# Patient Record
Sex: Male | Born: 1950 | Race: White | Hispanic: No | Marital: Married | Smoking: Former smoker
Health system: Southern US, Community
[De-identification: ages and names within clinical notes are randomized; demographics above are authoritative.]

## PROBLEM LIST (undated history)

## (undated) DIAGNOSIS — H269 Unspecified cataract: Secondary | ICD-10-CM

## (undated) DIAGNOSIS — K759 Inflammatory liver disease, unspecified: Secondary | ICD-10-CM

## (undated) DIAGNOSIS — I219 Acute myocardial infarction, unspecified: Secondary | ICD-10-CM

## (undated) DIAGNOSIS — Z87442 Personal history of urinary calculi: Secondary | ICD-10-CM

## (undated) DIAGNOSIS — E785 Hyperlipidemia, unspecified: Secondary | ICD-10-CM

## (undated) HISTORY — PX: EYE SURGERY: SHX253

## (undated) HISTORY — DX: Unspecified cataract: H26.9

## (undated) HISTORY — PX: COLONOSCOPY: SHX174

## (undated) HISTORY — DX: Hyperlipidemia, unspecified: E78.5

## (undated) HISTORY — DX: Inflammatory liver disease, unspecified: K75.9

## (undated) HISTORY — PX: POLYPECTOMY: SHX149

---
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
## 1973-08-30 HISTORY — PX: OTHER SURGICAL HISTORY: SHX169

## 2005-10-25 ENCOUNTER — Encounter: Payer: Self-pay | Admitting: Internal Medicine

## 2007-05-10 ENCOUNTER — Ambulatory Visit: Payer: Self-pay | Admitting: Internal Medicine

## 2007-05-11 LAB — CONVERTED CEMR LAB
Cholesterol: 217 mg/dL (ref 0–200)
Direct LDL: 150.7 mg/dL
PSA: 0.38 ng/mL (ref 0.10–4.00)
Triglycerides: 87 mg/dL (ref 0–149)

## 2007-05-16 ENCOUNTER — Encounter: Payer: Self-pay | Admitting: Internal Medicine

## 2007-06-06 ENCOUNTER — Encounter: Payer: Self-pay | Admitting: Internal Medicine

## 2007-06-16 ENCOUNTER — Encounter: Payer: Self-pay | Admitting: Internal Medicine

## 2007-12-12 ENCOUNTER — Ambulatory Visit: Payer: Self-pay | Admitting: Internal Medicine

## 2007-12-12 DIAGNOSIS — M6580 Other synovitis and tenosynovitis, unspecified site: Secondary | ICD-10-CM

## 2008-09-02 ENCOUNTER — Ambulatory Visit: Payer: Self-pay | Admitting: Internal Medicine

## 2008-09-02 DIAGNOSIS — N419 Inflammatory disease of prostate, unspecified: Secondary | ICD-10-CM | POA: Insufficient documentation

## 2008-09-02 LAB — CONVERTED CEMR LAB
Blood in Urine, dipstick: NEGATIVE
Glucose, Urine, Semiquant: NEGATIVE
Ketones, urine, test strip: NEGATIVE
Nitrite: NEGATIVE
Urobilinogen, UA: 0.2
pH: 5

## 2008-11-04 ENCOUNTER — Ambulatory Visit: Payer: Self-pay | Admitting: Internal Medicine

## 2008-11-04 DIAGNOSIS — D239 Other benign neoplasm of skin, unspecified: Secondary | ICD-10-CM | POA: Insufficient documentation

## 2008-11-07 LAB — CONVERTED CEMR LAB
Glucose, Bld: 73 mg/dL (ref 70–99)
PSA: 0.32 ng/mL (ref 0.10–4.00)

## 2008-11-21 ENCOUNTER — Encounter: Payer: Self-pay | Admitting: Internal Medicine

## 2008-11-27 ENCOUNTER — Encounter: Payer: Self-pay | Admitting: Internal Medicine

## 2009-12-26 ENCOUNTER — Ambulatory Visit: Payer: Self-pay | Admitting: Internal Medicine

## 2010-09-30 NOTE — Assessment & Plan Note (Signed)
Summary: cpx/mk   Vital Signs:  Patient profile:   60 year old male Weight:      218 pounds BMI:     26.81 Temp:     97.6 degrees F oral Pulse rate:   60 / minute Pulse rhythm:   regular BP sitting:   110 / 70  (left arm) Cuff size:   large  Vitals Entered By: Mervin Hack CMA Duncan Dull) (December 26, 2009 8:19 AM) CC: adult physical   History of Present Illness: Doing well  Notes some heel pain---upon first getting up in the AM discussed plantar fasciitis reviewed stability shoes and arch support (he has well preserved arches)  Awakens with numbness in both arms at times goes away very quickly discussed pillow and positioning --not worrisome  Still works with [employer] regularly  Allergies: No Known Drug Allergies  Past History:  Past medical, surgical, family and social histories (including risk factors) reviewed for relevance to current acute and chronic problems.  Past Medical History: Reviewed history from 05/10/2007 and no changes required. Unremarkable  Past Surgical History: Reviewed history from 05/10/2007 and no changes required. 1959 Bilateral inguinal hernia 1961 Tonsils and adenoids 1975 Arm injury  Family History: Reviewed history from 05/10/2007 and no changes required. Dad died when he was a baby of intestinal blockage Mom with cancer in lung (surgically resected) 2 --- 1/2brothers and 2----1/2 sisters No CAD, HTN, DM No colon or prostate Hearing loss on Mom's side  Social History: Reviewed history from 05/10/2007 and no changes required. Retired--Air traffic controller Married--2 sons Former Smoker--quit 30 years ago Alcohol use-occ  Review of Systems General:  Denies sleep disorder; weight stable wears seat belt. Eyes:  Denies double vision and vision loss-1 eye. ENT:  Denies decreased hearing and ringing in ears; teeth okay--regular with dentist. CV:  Denies chest pain or discomfort, difficulty breathing at night, difficulty breathing  while lying down, fainting, lightheadness, palpitations, and shortness of breath with exertion. Resp:  Denies cough and shortness of breath. GI:  Denies abdominal pain, bloody stools, change in bowel habits, dark tarry stools, indigestion, nausea, and vomiting. GU:  Complains of nocturia; denies erectile dysfunction, urinary frequency, and urinary hesitancy; nocturia x 1. MS:  Denies joint pain and joint swelling. Derm:  Denies lesion(s) and rash. Neuro:  See HPI; Complains of numbness; denies headaches and weakness. Psych:  Denies anxiety and depression. Heme:  Denies abnormal bruising and enlarge lymph nodes. Allergy:  Denies seasonal allergies and sneezing.  Physical Exam  General:  alert and normal appearance.   Eyes:  pupils equal, pupils round, pupils reactive to light, and no optic disk abnormalities.   Ears:  R ear normal and L ear normal.   Mouth:  no erythema, no exudates, and no lesions.   Neck:  supple, no masses, no thyromegaly, no carotid bruits, and no cervical lymphadenopathy.   Lungs:  normal respiratory effort and normal breath sounds.   Heart:  normal rate, regular rhythm, no murmur, and no gallop.   Abdomen:  soft.   Rectal:  no hemorrhoids and no masses.   Prostate:  no gland enlargement and no nodules.   Msk:  no joint tenderness and no joint swelling.   Preserved arches without tenderness Pulses:  normal in feet Extremities:  no edema Neurologic:  alert & oriented X3, strength normal in all extremities, and gait normal.   Skin:  no rashes and no suspicious lesions.   Axillary Nodes:  No palpable lymphadenopathy Psych:  normally interactive,  good eye contact, not anxious appearing, and not depressed appearing.     Impression & Recommendations:  Problem # 1:  PREVENTIVE HEALTH CARE (ICD-V70.0) Assessment Comment Only  healthy discussed fitness which he is doing PSA will check sugar  Orders: TLB-Glucose, QUANT (82947-GLU)  Other Orders: Venipuncture  (16109) TLB-PSA (Prostate Specific Antigen) (84153-PSA)  Patient Instructions: 1)  Please schedule a follow-up appointment in 1 year.   Prior Medications: None Current Allergies (reviewed today): No known allergies

## 2011-01-05 ENCOUNTER — Encounter: Payer: Self-pay | Admitting: Internal Medicine

## 2011-01-07 ENCOUNTER — Encounter: Payer: Self-pay | Admitting: Family Medicine

## 2011-01-07 ENCOUNTER — Ambulatory Visit (INDEPENDENT_AMBULATORY_CARE_PROVIDER_SITE_OTHER): Payer: Federal, State, Local not specified - PPO | Admitting: Family Medicine

## 2011-01-07 VITALS — BP 130/80 | HR 64 | Temp 98.2°F | Wt 215.0 lb

## 2011-01-07 DIAGNOSIS — R05 Cough: Secondary | ICD-10-CM

## 2011-01-07 MED ORDER — AZITHROMYCIN 250 MG PO TABS: ORAL_TABLET | ORAL | Status: DC

## 2011-01-07 NOTE — Progress Notes (Signed)
4 weeks ago with chest congestion and dry cough. "I thought it was allergies."  No help with allergy meds.  1 week ago he thought he was getting better, the cough was becoming productive.  "Over the weekend, I felt good.  More cough last night, better this AM."  Sluggish with less energy.  Some nausea prev.  His ears feel plugged up.  Normal appetite.  No GI/GU sx.  No fevers, no chills.    Wife starting with chemotherapy for endometrial cancer.  Meds, vitals, and allergies reviewed.   ROS: See HPI.  Otherwise, noncontributory.  GEN: nad, alert and oriented HEENT: mucous membranes moist, no erythema in OP, tm wnl with normal movement on valsalva NECK: supple w/o LA CV: rrr.  no murmur PULM: no inc wob, cta on R side but scattered ronchi on L sided, no focal dec in bs ABD: soft, +bs EXT: no edema SKIN: no acute rash

## 2011-01-07 NOTE — Assessment & Plan Note (Addendum)
?  walking pneumonia.  Give duration and exam, I would treat with zmax and have pt fu prn.  Nontoxic.  D/w pt and he understood.  CXR would likely not change management, so this was deferred.  This can be considered in the future if sx aren't resolving.   He agrees.

## 2011-01-07 NOTE — Patient Instructions (Signed)
I would start the antibiotics and let us know if the cough doesn't gradually resolve.  Take care.

## 2011-01-26 ENCOUNTER — Ambulatory Visit (INDEPENDENT_AMBULATORY_CARE_PROVIDER_SITE_OTHER): Payer: Federal, State, Local not specified - PPO | Admitting: Internal Medicine

## 2011-01-26 ENCOUNTER — Encounter: Payer: Self-pay | Admitting: Internal Medicine

## 2011-01-26 VITALS — BP 110/68 | HR 62 | Temp 98.6°F | Ht 76.0 in | Wt 215.0 lb

## 2011-01-26 DIAGNOSIS — Z0000 Encounter for general adult medical examination without abnormal findings: Secondary | ICD-10-CM | POA: Insufficient documentation

## 2011-01-26 NOTE — Progress Notes (Signed)
Subjective:    Patient ID: Philip Mack, male    DOB: Sep 11, 1950, 60 y.o.   MRN: [id number]  HPI Feels mostly better No cough Still some lightheadedness and feeling sluggish--mostly every other day Back to exercising though  No new concerns Colon due in 5 years  Current outpatient prescriptions:DISCONTD: azithromycin (ZITHROMAX) 250 MG tablet, Take 2 tablets by mouth on day 1, followed by 1 tablet by mouth daily for 4 days.  , Disp: , Rfl:   No past medical history on file.  Past Surgical History  Procedure Date  . Hernia repair 1959    Bilateral, inguinal  . Tonsillectomy and adenoidectomy 1961  . Arm injury 1975    Family History  Problem Relation Age of Onset  . Cancer Mother     Lung, (surgically resected)  . Heart disease Neg Hx     CAD  . Hypertension Neg Hx   . Diabetes Neg Hx   . Hearing loss Other     On Mom's side    History   Social History  . Marital Status: Married    Spouse Name: N/A    Number of Children: 2  . Years of Education: N/A   Occupational History  . Retired Forensic psychologist    Social History Main Topics  . Smoking status: Former Smoker    Types: Cigarettes    Quit date: 08/30/1980  . Smokeless tobacco: Not on file  . Alcohol Use: Yes     Occasionally  . Drug Use: Not on file  . Sexually Active: Not on file   Other Topics Concern  . Not on file   Social History Narrative  . No narrative on file   Review of Systems  Constitutional: Negative for activity change and unexpected weight change.       Wears seat belt  HENT: Negative for hearing loss, congestion, rhinorrhea, dental problem and tinnitus.   Eyes: Negative for visual disturbance.       No diplopia or focal vision loss  Respiratory: Negative for cough, chest tightness and shortness of breath.   Cardiovascular: Negative for chest pain, palpitations and leg swelling.  Gastrointestinal: Negative for nausea, vomiting, constipation and blood in stool.       No  heartburn  Genitourinary: Negative for dysuria, urgency and difficulty urinating.       No sexual problems  Musculoskeletal: Negative for back pain, joint swelling and arthralgias.  Skin: Negative for rash.       No suspicious lesions  Neurological: Negative for dizziness, syncope, weakness, light-headedness, numbness and headaches.  Hematological: Negative for adenopathy. Does not bruise/bleed easily.  Psychiatric/Behavioral: Negative for sleep disturbance and dysphoric mood. The patient is not nervous/anxious.        Stress---wife with endometrial cancer undergoing Rx       Objective:   Physical Exam  Constitutional: He is oriented to person, place, and time. He appears well-developed and well-nourished. No distress.  HENT:  Head: Normocephalic.  Right Ear: External ear normal.  Mouth/Throat: Oropharynx is clear and moist. No oropharyngeal exudate.       TMs benign  Eyes: Conjunctivae and EOM are normal. Pupils are equal, round, and reactive to light.       Fundi benign  Neck: Normal range of motion. Neck supple. No thyromegaly present.  Cardiovascular: Normal rate, regular rhythm and intact distal pulses.  Exam reveals no gallop.   No murmur heard. Pulmonary/Chest: Effort normal and breath sounds normal. No respiratory distress. He has  no wheezes. He has no rales.  Abdominal: He exhibits no mass. There is no tenderness.  Musculoskeletal: Normal range of motion. He exhibits no edema and no tenderness.  Lymphadenopathy:    He has no cervical adenopathy.  Neurological: He is alert and oriented to person, place, and time. He exhibits normal muscle tone.       Normal strength and gait  Skin: Skin is warm. No rash noted.  Psychiatric: He has a normal mood and affect. His behavior is normal. Judgment and thought content normal.          Assessment & Plan:

## 2011-06-11 ENCOUNTER — Encounter: Payer: Self-pay | Admitting: Family Medicine

## 2011-06-11 ENCOUNTER — Ambulatory Visit (INDEPENDENT_AMBULATORY_CARE_PROVIDER_SITE_OTHER): Payer: Federal, State, Local not specified - PPO | Admitting: Family Medicine

## 2011-06-11 DIAGNOSIS — J3489 Other specified disorders of nose and nasal sinuses: Secondary | ICD-10-CM

## 2011-06-11 MED ORDER — MUPIROCIN CALCIUM 2 % EX CREA: TOPICAL_CREAM | CUTANEOUS | Status: DC

## 2011-06-11 NOTE — Progress Notes (Signed)
  Subjective:    Patient ID: Philip Mack, male    DOB: 09/19/1950, 60 y.o.   MRN: [id number]  HPI CC: sore inside nostril  Several month (>8wks) h/o sore along upper rim of left nostril.  Does bleed occasionally.  Initially with spot on right nostril as well, but that has since cleared.  Tried ice, hot packs, peroxide.  Swelling goes down but never fully heals.  No other rash.  No oral lesions.  No joint or muscle pains.  No fevers/chills.  Review of Systems Per HPI    Objective:   Physical Exam  Nursing note and vitals reviewed. Constitutional: He appears well-developed and well-nourished. No distress.  HENT:  Head: Normocephalic and atraumatic.  Right Ear: Hearing, tympanic membrane, external ear and ear canal normal.  Left Ear: Hearing, tympanic membrane, external ear and ear canal normal.  Nose: No mucosal edema, rhinorrhea, septal deviation or nasal septal hematoma. No epistaxis.  No foreign bodies. Right sinus exhibits no maxillary sinus tenderness and no frontal sinus tenderness. Left sinus exhibits no maxillary sinus tenderness and no frontal sinus tenderness.  Mouth/Throat: Uvula is midline, oropharynx is clear and moist and mucous membranes are normal. No oropharyngeal exudate, posterior oropharyngeal edema, posterior oropharyngeal erythema or tonsillar abscesses.       Sub-mm lesion left superior interior nasal ridge - dry crusted shallow ulcer. No mucosal lesions identified.  Eyes: Conjunctivae and EOM are normal. Pupils are equal, round, and reactive to light. No scleral icterus.  Neck: Normal range of motion. Neck supple.  Cardiovascular: Normal rate, regular rhythm, normal heart sounds and intact distal pulses.   No murmur heard. Pulmonary/Chest: Effort normal and breath sounds normal. No respiratory distress. He has no wheezes. He has no rales.  Lymphadenopathy:    He has no cervical adenopathy.  Skin: Skin is warm and dry. No rash noted.  Psychiatric: He has a  normal mood and affect.          Assessment & Plan:

## 2011-06-11 NOTE — Patient Instructions (Signed)
Let's use mupirocin 3 times a day for next 1-2 wks. If not healing, let me know and I will set you up with dermatology for further evaluation. Stop peroxide. May continue warm compresses. Good to see you today.

## 2011-06-11 NOTE — Assessment & Plan Note (Signed)
Poorly healing shallow ulcer on exterior ridge of tip of left ala. Treat with mupirocin (cover MRSA) for next 1-2 wks. If not improved, advised to call us for referral to derm for further eval.

## 2012-03-30 ENCOUNTER — Ambulatory Visit (INDEPENDENT_AMBULATORY_CARE_PROVIDER_SITE_OTHER): Payer: Federal, State, Local not specified - PPO | Admitting: Internal Medicine

## 2012-03-30 ENCOUNTER — Encounter: Payer: Self-pay | Admitting: Internal Medicine

## 2012-03-30 VITALS — BP 131/71 | HR 61 | Temp 98.3°F | Ht 76.0 in | Wt 220.5 lb

## 2012-03-30 DIAGNOSIS — E785 Hyperlipidemia, unspecified: Secondary | ICD-10-CM

## 2012-03-30 DIAGNOSIS — Z0000 Encounter for general adult medical examination without abnormal findings: Secondary | ICD-10-CM

## 2012-03-30 NOTE — Assessment & Plan Note (Signed)
HDL is good Will just recheck but no plans for meds

## 2012-03-30 NOTE — Patient Instructions (Signed)
Please try loratadine 10mg  1-2 daily or cetirizine 10mg  daily for spring allergies

## 2012-03-30 NOTE — Progress Notes (Signed)
Subjective:    Patient ID: Philip Mack, male    DOB: Mar 01, 1951, 61 y.o.   MRN: [id number]  HPI Doing well  Has noted some rash and itching along extensor right arm Mild stress only---issues at church ?exposure in yard Discussed neurodermatitis  Has had some allergy issues in the past 2 springs Phlegm in throat and congestion Discussed allergy meds OTC  Current Outpatient Prescriptions on File Prior to Visit  Medication Sig Dispense Refill  . mupirocin (BACTROBAN) 2 % cream Apply to affected area 3 times daily  30 g  0    No Known Allergies  Past Medical History  Diagnosis Date  . Hyperlipidemia     Past Surgical History  Procedure Date  . Hernia repair 1959    Bilateral, inguinal  . Tonsillectomy and adenoidectomy 1961  . Arm injury 1975    Family History  Problem Relation Age of Onset  . Cancer Mother     Lung, (surgically resected)  . Heart disease Neg Hx     CAD  . Hypertension Neg Hx   . Diabetes Neg Hx   . Hearing loss Other     On Mom's side    History   Social History  . Marital Status: Married    Spouse Name: N/A    Number of Children: 2  . Years of Education: N/A   Occupational History  . Retired Forensic psychologist    Social History Main Topics  . Smoking status: Former Smoker    Types: Cigarettes    Quit date: 08/30/1980  . Smokeless tobacco: Not on file  . Alcohol Use: Yes     Occasionally  . Drug Use: No  . Sexually Active: Not on file   Other Topics Concern  . Not on file   Social History Narrative  . No narrative on file   Review of Systems  Constitutional: Negative for fatigue and unexpected weight change.       Wears seat belt [facility] [employer]  HENT: Positive for congestion and rhinorrhea. Negative for hearing loss, dental problem and tinnitus.        Regular with dentist---has needed periodontal work also  Eyes: Negative for visual disturbance.       No diplopia or unilateral vision loss    Respiratory: Negative for cough, chest tightness and shortness of breath.   Cardiovascular: Negative for chest pain, palpitations and leg swelling.  Gastrointestinal: Negative for nausea, vomiting, abdominal pain, constipation and blood in stool.       No heartburn  Genitourinary: Negative for urgency, frequency and difficulty urinating.       Nocturia x 1. Rarely 2 No sexual problems  Musculoskeletal: Negative for back pain, joint swelling and arthralgias.  Skin: Positive for rash.       No suspicious lesions  Neurological: Negative for dizziness, syncope, weakness, light-headedness, numbness and headaches.       Extremities "fall asleep" easier at night  Hematological: Negative for adenopathy. Does not bruise/bleed easily.  Psychiatric/Behavioral: Negative for disturbed wake/sleep cycle and dysphoric mood. The patient is not nervous/anxious.        Objective:   Physical Exam  Constitutional: He is oriented to person, place, and time. He appears well-developed and well-nourished. No distress.  HENT:  Head: Normocephalic and atraumatic.  Right Ear: External ear normal.  Left Ear: External ear normal.  Mouth/Throat: Oropharynx is clear and moist. No oropharyngeal exudate.  Eyes: Conjunctivae and EOM are normal. Pupils are equal, round,  and reactive to light.  Neck: Normal range of motion. Neck supple. No thyromegaly present.  Cardiovascular: Normal rate, regular rhythm, normal heart sounds and intact distal pulses.  Exam reveals no gallop.   No murmur heard. Pulmonary/Chest: Effort normal and breath sounds normal. No respiratory distress. He has no wheezes. He has no rales.  Abdominal: Soft. There is no tenderness.  Musculoskeletal: Normal range of motion. He exhibits no edema and no tenderness.  Lymphadenopathy:    He has no cervical adenopathy.  Neurological: He is alert and oriented to person, place, and time.  Skin: Skin is warm. No rash noted.  Psychiatric: He has a normal  mood and affect. His behavior is normal. Judgment and thought content normal.          Assessment & Plan:

## 2012-03-30 NOTE — Assessment & Plan Note (Signed)
Healthy Not due for colonoscopy till 2017 Will defer PSA at least this year Really does well with fitness

## 2012-03-31 ENCOUNTER — Telehealth: Payer: Self-pay | Admitting: Radiology

## 2012-03-31 LAB — LIPID PANEL
Cholesterol: 221 mg/dL — ABNORMAL HIGH (ref 0–200)
Total CHOL/HDL Ratio: 4
Triglycerides: 124 mg/dL (ref 0.0–149.0)
VLDL: 24.8 mg/dL (ref 0.0–40.0)

## 2012-03-31 LAB — HEPATIC FUNCTION PANEL
ALT: 24 U/L (ref 0–53)
Alkaline Phosphatase: 44 U/L (ref 39–117)
Bilirubin, Direct: 0.1 mg/dL (ref 0.0–0.3)
Total Bilirubin: 0.5 mg/dL (ref 0.3–1.2)

## 2012-03-31 LAB — CBC WITH DIFFERENTIAL/PLATELET
Basophils Relative: 0.2 % (ref 0.0–3.0)
Eosinophils Absolute: 0.1 10*3/uL (ref 0.0–0.7)
Eosinophils Relative: 1.2 % (ref 0.0–5.0)
Lymphocytes Relative: 31.4 % (ref 12.0–46.0)
MCHC: 33.5 g/dL (ref 30.0–36.0)
Monocytes Relative: 12.6 % — ABNORMAL HIGH (ref 3.0–12.0)
Neutrophils Relative %: 54.6 % (ref 43.0–77.0)
RBC: 4.65 Mil/uL (ref 4.22–5.81)
WBC: 5.9 10*3/uL (ref 4.5–10.5)

## 2012-03-31 LAB — BASIC METABOLIC PANEL
Calcium: 9.7 mg/dL (ref 8.4–10.5)
Creatinine, Ser: 0.9 mg/dL (ref 0.4–1.5)
GFR: 88.97 mL/min (ref >60.00)

## 2012-03-31 NOTE — Telephone Encounter (Signed)
[facility] called a critical Glucose - 48, results given to Dr Alphonsus Sias

## 2012-03-31 NOTE — Telephone Encounter (Signed)
Not on oral hypoglycemics--not a concern

## 2012-04-04 ENCOUNTER — Encounter: Payer: Self-pay | Admitting: *Deleted

## 2012-06-14 ENCOUNTER — Emergency Department: Payer: Self-pay | Admitting: Emergency Medicine

## 2012-06-23 ENCOUNTER — Emergency Department: Payer: Self-pay | Admitting: Internal Medicine

## 2013-05-25 ENCOUNTER — Ambulatory Visit (INDEPENDENT_AMBULATORY_CARE_PROVIDER_SITE_OTHER): Payer: Federal, State, Local not specified - PPO | Admitting: Internal Medicine

## 2013-05-25 ENCOUNTER — Encounter: Payer: Self-pay | Admitting: Internal Medicine

## 2013-05-25 VITALS — BP 120/70 | HR 68 | Temp 97.7°F | Ht 76.0 in | Wt 221.0 lb

## 2013-05-25 DIAGNOSIS — Z0000 Encounter for general adult medical examination without abnormal findings: Secondary | ICD-10-CM

## 2013-05-25 DIAGNOSIS — Z125 Encounter for screening for malignant neoplasm of prostate: Secondary | ICD-10-CM

## 2013-05-25 MED ORDER — ZOSTER VACCINE LIVE 19400 UNT/0.65ML ~~LOC~~ SOLR: 0.6500 mL | Freq: Once | SUBCUTANEOUS | Status: DC

## 2013-05-25 NOTE — Assessment & Plan Note (Signed)
Healthy Stays fit Counseling done Rx for zostavax Will check PSA Defer other labs this year

## 2013-05-25 NOTE — Progress Notes (Signed)
Subjective:    Patient ID: Philip Mack, male    DOB: 11-May-1951, 62 y.o.   MRN: [id number]  HPI Here for physical Retired but volunteers at [employer] for [employer] Still works out regularly  Has a scab on the top of his head for weeks Won't heal Not itchy  No current outpatient prescriptions on file prior to visit.   No current facility-administered medications on file prior to visit.    No Known Allergies  Past Medical History  Diagnosis Date  . Hyperlipidemia     Past Surgical History  Procedure Laterality Date  . Hernia repair  1959    Bilateral, inguinal  . Tonsillectomy and adenoidectomy  1961  . Arm injury  1975    Family History  Problem Relation Age of Onset  . Cancer Mother     Lung, (surgically resected)  . Heart disease Neg Hx     CAD  . Hypertension Neg Hx   . Diabetes Neg Hx   . Hearing loss Other     On Mom's side    History   Social History  . Marital Status: Married    Spouse Name: N/A    Number of Children: 2  . Years of Education: N/A   Occupational History  . Retired Forensic psychologist    Social History Main Topics  . Smoking status: Former Smoker    Types: Cigarettes    Quit date: 08/30/1980  . Smokeless tobacco: Never Used  . Alcohol Use: Yes     Comment: Occasionally  . Drug Use: No  . Sexual Activity: Not on file   Other Topics Concern  . Not on file   Social History Narrative  . No narrative on file   Review of Systems  Constitutional: Negative for fatigue and unexpected weight change.       Wears seat belt  HENT: Negative for hearing loss, congestion, rhinorrhea, dental problem and tinnitus.        Regular with dentist and periodentist  Eyes: Negative for visual disturbance.       No diplopia or unilateral vision loss  Respiratory: Negative for cough, chest tightness and shortness of breath.   Cardiovascular: Negative for chest pain, palpitations and leg swelling.  Gastrointestinal: Negative for nausea,  vomiting, abdominal pain, constipation and blood in stool.       No heartburn  Endocrine: Negative for cold intolerance and heat intolerance.  Genitourinary: Positive for difficulty urinating. Negative for urgency and frequency.       Rare dribbling No sexual problems  Musculoskeletal: Negative for back pain, joint swelling and arthralgias.  Skin: Negative for rash.       Spot on head  Allergic/Immunologic: Negative for environmental allergies and immunocompromised state.  Neurological: Negative for dizziness, syncope, weakness, light-headedness, numbness and headaches.  Hematological: Negative for adenopathy. Does not bruise/bleed easily.  Psychiatric/Behavioral: Negative for sleep disturbance and dysphoric mood. The patient is not nervous/anxious.        Objective:   Physical Exam  Constitutional: He is oriented to person, place, and time. He appears well-developed and well-nourished. No distress.  HENT:  Head: Normocephalic and atraumatic.  Right Ear: External ear normal.  Left Ear: External ear normal.  Mouth/Throat: Oropharynx is clear and moist. No oropharyngeal exudate.  Eyes: Conjunctivae and EOM are normal. Pupils are equal, round, and reactive to light.  Neck: Normal range of motion. Neck supple. No thyromegaly present.  Cardiovascular: Normal rate, regular rhythm, normal heart sounds and intact distal pulses.  Exam reveals no gallop.   No murmur heard. Pulmonary/Chest: Effort normal and breath sounds normal. No respiratory distress. He has no wheezes. He has no rales.  Abdominal: Soft. There is no tenderness.  Musculoskeletal: He exhibits no edema and no tenderness.  Lymphadenopathy:    He has no cervical adenopathy.  Neurological: He is alert and oriented to person, place, and time.  Skin: No rash noted. No erythema.  Psychiatric: He has a normal mood and affect. His behavior is normal.          Assessment & Plan:

## 2014-07-08 ENCOUNTER — Ambulatory Visit (INDEPENDENT_AMBULATORY_CARE_PROVIDER_SITE_OTHER): Payer: Federal, State, Local not specified - PPO | Admitting: Internal Medicine

## 2014-07-08 ENCOUNTER — Encounter: Payer: Self-pay | Admitting: Internal Medicine

## 2014-07-08 VITALS — BP 118/78 | HR 65 | Temp 98.4°F | Ht 76.0 in | Wt 219.0 lb

## 2014-07-08 DIAGNOSIS — Z0000 Encounter for general adult medical examination without abnormal findings: Secondary | ICD-10-CM

## 2014-07-08 DIAGNOSIS — Z23 Encounter for immunization: Secondary | ICD-10-CM

## 2014-07-08 DIAGNOSIS — E785 Hyperlipidemia, unspecified: Secondary | ICD-10-CM

## 2014-07-08 DIAGNOSIS — S46911A Strain of unspecified muscle, fascia and tendon at shoulder and upper arm level, right arm, initial encounter: Secondary | ICD-10-CM

## 2014-07-08 DIAGNOSIS — M25511 Pain in right shoulder: Secondary | ICD-10-CM | POA: Insufficient documentation

## 2014-07-08 LAB — COMPREHENSIVE METABOLIC PANEL
ALK PHOS: 46 U/L (ref 39–117)
ALT: 23 U/L (ref 0–53)
AST: 30 U/L (ref 0–37)
Albumin: 3.8 g/dL (ref 3.5–5.2)
BILIRUBIN TOTAL: 0.8 mg/dL (ref 0.2–1.2)
BUN: 14 mg/dL (ref 6–23)
CO2: 27 mEq/L (ref 19–32)
Calcium: 9.6 mg/dL (ref 8.4–10.5)
Chloride: 104 mEq/L (ref 96–112)
Creatinine, Ser: 0.9 mg/dL (ref 0.4–1.5)
GFR: 91.75 mL/min (ref >60.00)
Glucose, Bld: 91 mg/dL (ref 70–99)
Potassium: 4.1 mEq/L (ref 3.5–5.1)
SODIUM: 138 meq/L (ref 135–145)
TOTAL PROTEIN: 7.2 g/dL (ref 6.0–8.3)

## 2014-07-08 LAB — T4, FREE: FREE T4: 0.97 ng/dL (ref 0.60–1.60)

## 2014-07-08 LAB — CBC WITH DIFFERENTIAL/PLATELET
Basophils Absolute: 0 10*3/uL (ref 0.0–0.1)
Basophils Relative: 0.4 % (ref 0.0–3.0)
EOS PCT: 0.6 % (ref 0.0–5.0)
Eosinophils Absolute: 0 10*3/uL (ref 0.0–0.7)
HEMATOCRIT: 45.8 % (ref 39.0–52.0)
HEMOGLOBIN: 15.2 g/dL (ref 13.0–17.0)
Lymphocytes Relative: 25.8 % (ref 12.0–46.0)
Lymphs Abs: 1.7 10*3/uL (ref 0.7–4.0)
MCHC: 33.3 g/dL (ref 30.0–36.0)
MCV: 89.8 fl (ref 78.0–100.0)
MONOS PCT: 9.2 % (ref 3.0–12.0)
Monocytes Absolute: 0.6 10*3/uL (ref 0.1–1.0)
NEUTROS ABS: 4.3 10*3/uL (ref 1.4–7.7)
Neutrophils Relative %: 64 % (ref 43.0–77.0)
Platelets: 184 10*3/uL (ref 150.0–400.0)
RBC: 5.1 Mil/uL (ref 4.22–5.81)
RDW: 14.1 % (ref 11.5–15.5)
WBC: 6.7 10*3/uL (ref 4.0–10.5)

## 2014-07-08 LAB — LIPID PANEL
CHOL/HDL RATIO: 4
Cholesterol: 236 mg/dL — ABNORMAL HIGH (ref 0–200)
HDL: 60.8 mg/dL (ref >39.00)
LDL Cholesterol: 163 mg/dL — ABNORMAL HIGH (ref 0–99)
NonHDL: 175.2
Triglycerides: 59 mg/dL (ref 0.0–149.0)
VLDL: 11.8 mg/dL (ref 0.0–40.0)

## 2014-07-08 NOTE — Progress Notes (Signed)
Subjective:    Patient ID: Philip Mack, male    DOB: [birth year], 63 y.o.   MRN: [id number]  HPI Here for physical  Having some right shoulder pain Came on suddenly a while ago Had been very painful at night--- would awaken him Tries to avoid meds---occasional ibuprofen and naproxen Still some weakness in arm Some weight work at [employer]---has cut back some  .still volunteers 1/2 day, 4 days per week for [employer] No current outpatient prescriptions on file prior to visit.   No current facility-administered medications on file prior to visit.    No Known Allergies  Past Medical History  Diagnosis Date  . Hyperlipidemia     Past Surgical History  Procedure Laterality Date  . Hernia repair  1959    Bilateral, inguinal  . Tonsillectomy and adenoidectomy  1961  . Arm injury  1975    Family History  Problem Relation Age of Onset  . Cancer Mother     Lung, (surgically resected)  . Heart disease Mother   . Hypertension Neg Hx   . Diabetes Neg Hx   . Hearing loss Other     On Mom's side    History   Social History  . Marital Status: Married    Spouse Name: N/A    Number of Children: 2  . Years of Education: N/A   Occupational History  . Retired Psychologist, sport and exercise    Social History Main Topics  . Smoking status: Former Smoker    Types: Cigarettes    Quit date: 08/30/1980  . Smokeless tobacco: Never Used  . Alcohol Use: Yes     Comment: Occasionally  . Drug Use: No  . Sexual Activity: Not on file   Other Topics Concern  . Not on file   Social History Narrative   Review of Systems  Constitutional: Negative for fatigue and unexpected weight change.       Wears seat belt  HENT: Positive for hearing loss. Negative for dental problem and tinnitus.        Slight hearing loss Regular with dentist  Eyes: Negative for visual disturbance.       No diplopia or unilateral vision loss  Respiratory: Negative for cough, chest tightness and shortness  of breath.   Cardiovascular: Negative for chest pain, palpitations and leg swelling.  Gastrointestinal: Negative for nausea, vomiting, abdominal pain, constipation and blood in stool.  Endocrine: Negative for polydipsia and polyuria.  Genitourinary: Negative for difficulty urinating.       Nocturia x 1 now No sexual problems  Musculoskeletal: Positive for arthralgias. Negative for back pain and joint swelling.  Skin: Negative for rash.       No suspicious lesions  Allergic/Immunologic: Negative for environmental allergies and immunocompromised state.  Neurological: Negative for dizziness, syncope, weakness, light-headedness, numbness and headaches.  Hematological: Negative for adenopathy. Does not bruise/bleed easily.  Psychiatric/Behavioral: Negative for sleep disturbance and dysphoric mood. The patient is not nervous/anxious.        Objective:   Physical Exam  Constitutional: He is oriented to person, place, and time. He appears well-developed and well-nourished. No distress.  HENT:  Head: Normocephalic and atraumatic.  Right Ear: External ear normal.  Left Ear: External ear normal.  Mouth/Throat: Oropharynx is clear and moist. No oropharyngeal exudate.  Eyes: Conjunctivae and EOM are normal. Pupils are equal, round, and reactive to light.  Neck: Normal range of motion. Neck supple. No thyromegaly present.  Cardiovascular: Normal rate, regular rhythm, normal  heart sounds and intact distal pulses.  Exam reveals no gallop.   No murmur heard. Pulmonary/Chest: Effort normal and breath sounds normal. No respiratory distress. He has no wheezes. He has no rales.  Abdominal: Soft. He exhibits no distension. There is no tenderness. There is no rebound and no guarding.  Musculoskeletal: He exhibits no edema or tenderness.  Lymphadenopathy:    He has no cervical adenopathy.  Neurological: He is alert and oriented to person, place, and time.  Skin: No rash noted. No erythema.  Psychiatric:  He has a normal mood and affect. His behavior is normal.          Assessment & Plan:

## 2014-07-08 NOTE — Assessment & Plan Note (Signed)
Low risk profile No meds for primary prevention

## 2014-07-08 NOTE — Assessment & Plan Note (Signed)
Healthy PSA deferred to at least next year Colonoscopy due 2017 Flu shot today Tetanus due 2018

## 2014-07-08 NOTE — Assessment & Plan Note (Signed)
No obvious arthritis Full active ROM Reassured---probably mild muscle injury

## 2014-07-08 NOTE — Addendum Note (Signed)
Addended by: Despina Hidden on: 07/08/2014 05:14 PM   Modules accepted: Orders

## 2014-07-08 NOTE — Progress Notes (Signed)
Pre visit review using our clinic review tool, if applicable. No additional management support is needed unless otherwise documented below in the visit note. 

## 2015-07-14 ENCOUNTER — Encounter: Payer: Self-pay | Admitting: Internal Medicine

## 2015-07-14 ENCOUNTER — Other Ambulatory Visit: Payer: Self-pay | Admitting: Internal Medicine

## 2015-07-14 ENCOUNTER — Ambulatory Visit (INDEPENDENT_AMBULATORY_CARE_PROVIDER_SITE_OTHER): Payer: Federal, State, Local not specified - PPO | Admitting: Internal Medicine

## 2015-07-14 VITALS — BP 128/80 | HR 68 | Temp 98.2°F | Ht 76.0 in | Wt 217.0 lb

## 2015-07-14 DIAGNOSIS — E785 Hyperlipidemia, unspecified: Secondary | ICD-10-CM | POA: Diagnosis not present

## 2015-07-14 DIAGNOSIS — Z0000 Encounter for general adult medical examination without abnormal findings: Secondary | ICD-10-CM | POA: Diagnosis not present

## 2015-07-14 DIAGNOSIS — Z125 Encounter for screening for malignant neoplasm of prostate: Secondary | ICD-10-CM | POA: Diagnosis not present

## 2015-07-14 DIAGNOSIS — Z1211 Encounter for screening for malignant neoplasm of colon: Secondary | ICD-10-CM | POA: Diagnosis not present

## 2015-07-14 DIAGNOSIS — Z23 Encounter for immunization: Secondary | ICD-10-CM

## 2015-07-14 LAB — LIPID PANEL
Cholesterol: 232 mg/dL — ABNORMAL HIGH (ref 0–200)
HDL: 61.3 mg/dL (ref >39.00)
LDL Cholesterol: 151 mg/dL — ABNORMAL HIGH (ref 0–99)
NonHDL: 170.41
TRIGLYCERIDES: 98 mg/dL (ref 0.0–149.0)
Total CHOL/HDL Ratio: 4
VLDL: 19.6 mg/dL (ref 0.0–40.0)

## 2015-07-14 LAB — GLUCOSE, RANDOM: Glucose, Bld: 91 mg/dL (ref 70–99)

## 2015-07-14 LAB — PSA: PSA: 0.28 ng/mL (ref 0.10–4.00)

## 2015-07-14 NOTE — Assessment & Plan Note (Signed)
Healthy Flu vaccine today PSA after discussion Due for colon soon--will set up

## 2015-07-14 NOTE — Addendum Note (Signed)
Addended by: Despina Hidden on: 07/14/2015 01:59 PM   Modules accepted: Orders

## 2015-07-14 NOTE — Assessment & Plan Note (Signed)
Low risk No meds but will recheck

## 2015-07-14 NOTE — Progress Notes (Signed)
Pre visit review using our clinic review tool, if applicable. No additional management support is needed unless otherwise documented below in the visit note. 

## 2015-07-14 NOTE — Progress Notes (Signed)
Subjective:    Patient ID: Philip Mack, male    DOB: [birth year], 64 y.o.   MRN: [id number]  HPI Here for physical  Still has some aching in right shoulder Better than last year Now does some weight training Still goes to [employer] 6 days per week and sometimes jogs on Sunday   No other concerns  No current outpatient prescriptions on file prior to visit.   No current facility-administered medications on file prior to visit.    No Known Allergies  Past Medical History  Diagnosis Date  . Hyperlipidemia     Past Surgical History  Procedure Laterality Date  . Hernia repair  1959    Bilateral, inguinal  . Tonsillectomy and adenoidectomy  1961  . Arm injury  1975    Family History  Problem Relation Age of Onset  . Cancer Mother     Lung, (surgically resected)  . Heart disease Mother   . Hypertension Neg Hx   . Diabetes Neg Hx   . Hearing loss Other     On Mom's side    Social History   Social History  . Marital Status: Married    Spouse Name: N/A  . Number of Children: 2  . Years of Education: N/A   Occupational History  . Retired Psychologist, sport and exercise    Social History Main Topics  . Smoking status: Former Smoker    Types: Cigarettes    Quit date: 08/30/1980  . Smokeless tobacco: Never Used  . Alcohol Use: Yes     Comment: Occasionally  . Drug Use: No  . Sexual Activity: Not on file   Other Topics Concern  . Not on file   Social History Narrative     Review of Systems  Constitutional: Negative for fatigue and unexpected weight change.       Wears seat belt  HENT: Positive for hearing loss. Negative for dental problem and tinnitus.        Keeps up with dentist  Eyes: Negative for visual disturbance.       No diplopia or unilateral vision loss  Respiratory: Negative for cough, chest tightness and shortness of breath.   Cardiovascular: Negative for chest pain, palpitations and leg swelling.  Gastrointestinal: Negative for nausea, abdominal pain,  constipation and blood in stool.       No heartburn   Endocrine: Negative for polydipsia and polyuria.  Genitourinary: Negative for urgency and difficulty urinating.       Less sexual drive but no ED  Musculoskeletal: Negative for back pain and joint swelling.  Skin: Negative for rash.       No suspicious lesions  Allergic/Immunologic: Negative for environmental allergies and immunocompromised state.  Neurological: Negative for dizziness, syncope, weakness, light-headedness and headaches.  Hematological: Negative for adenopathy. Does not bruise/bleed easily.  Psychiatric/Behavioral: Negative for sleep disturbance and dysphoric mood. The patient is not nervous/anxious.        Objective:   Physical Exam  Constitutional: He is oriented to person, place, and time. He appears well-developed and well-nourished. No distress.  HENT:  Head: Normocephalic and atraumatic.  Right Ear: External ear normal.  Left Ear: External ear normal.  Mouth/Throat: Oropharynx is clear and moist. No oropharyngeal exudate.  Eyes: Conjunctivae are normal. Pupils are equal, round, and reactive to light.  Neck: Normal range of motion. Neck supple. No thyromegaly present.  Cardiovascular: Normal rate, regular rhythm, normal heart sounds and intact distal pulses.  Exam reveals no gallop.   No murmur  heard. Pulmonary/Chest: Effort normal and breath sounds normal. No respiratory distress. He has no wheezes. He has no rales.  Abdominal: Soft. There is no tenderness.  Musculoskeletal: He exhibits no edema or tenderness.  Lymphadenopathy:    He has no cervical adenopathy.  Neurological: He is alert and oriented to person, place, and time.  Skin: No rash noted. No erythema.  Psychiatric: He has a normal mood and affect. His behavior is normal.          Assessment & Plan:

## 2015-07-15 LAB — HEPATITIS C ANTIBODY: HCV AB: NEGATIVE

## 2015-08-20 ENCOUNTER — Ambulatory Visit (AMBULATORY_SURGERY_CENTER): Payer: Self-pay

## 2015-08-20 VITALS — Ht 76.0 in | Wt 215.0 lb

## 2015-08-20 DIAGNOSIS — Z1211 Encounter for screening for malignant neoplasm of colon: Secondary | ICD-10-CM

## 2015-08-20 MED ORDER — SUPREP BOWEL PREP KIT 17.5-3.13-1.6 GM/177ML PO SOLN: 1.0000 | Freq: Once | ORAL | Status: DC

## 2015-08-20 NOTE — Progress Notes (Signed)
No allergies to eggs or soy No diet/weight loss meds No home oxygen No past problems with anesthesia  Has email and internet; refused emmi 

## 2015-09-02 ENCOUNTER — Ambulatory Visit (AMBULATORY_SURGERY_CENTER): Payer: Federal, State, Local not specified - PPO | Admitting: Internal Medicine

## 2015-09-02 ENCOUNTER — Encounter: Payer: Self-pay | Admitting: Internal Medicine

## 2015-09-02 VITALS — BP 115/67 | HR 51 | Temp 96.5°F | Resp 10 | Ht 76.0 in | Wt 215.0 lb

## 2015-09-02 DIAGNOSIS — D122 Benign neoplasm of ascending colon: Secondary | ICD-10-CM

## 2015-09-02 DIAGNOSIS — D125 Benign neoplasm of sigmoid colon: Secondary | ICD-10-CM

## 2015-09-02 DIAGNOSIS — D128 Benign neoplasm of rectum: Secondary | ICD-10-CM | POA: Diagnosis not present

## 2015-09-02 DIAGNOSIS — Z1211 Encounter for screening for malignant neoplasm of colon: Secondary | ICD-10-CM | POA: Diagnosis present

## 2015-09-02 DIAGNOSIS — D123 Benign neoplasm of transverse colon: Secondary | ICD-10-CM

## 2015-09-02 MED ORDER — SODIUM CHLORIDE 0.9 % IV SOLN: 500.0000 mL | INTRAVENOUS | Status: DC

## 2015-09-02 NOTE — Op Note (Signed)
[place]  [address] [place], [id number]   COLONOSCOPY PROCEDURE REPORT  PATIENT: Philip Mack, Philip Mack  MR#: [id number] BIRTHDATE: 1951-05-12 , 64  yrs. old GENDER: male ENDOSCOPIST: Jerene Bears, MD REFERRED [id number] Silvio Pate, M.D. PROCEDURE DATE:  09/02/2015 PROCEDURE:   Colonoscopy, screening and Colonoscopy with snare polypectomy First Screening Colonoscopy - Avg.  risk and is 50 yrs.  old or older - No.  Prior Negative Screening - Now for repeat screening. 10 or more years since last screening  History of Adenoma - Now for follow-up colonoscopy & has been > or = to 3 yrs.  N/A  Polyps removed today? Yes ASA CLASS:   Class II INDICATIONS:Screening for colonic neoplasia, Colorectal Neoplasm Risk Assessment for this procedure is average risk, and last colon 10 yrs ago (2007) in [place] (diverticulosis, no polyps). MEDICATIONS: Monitored anesthesia care and Propofol 200 mg IV  DESCRIPTION OF PROCEDURE:   After the risks benefits and alternatives of the procedure were thoroughly explained, informed consent was obtained.  The digital rectal exam revealed no rectal mass.   The LB [id number]  endoscope was introduced through the anus and advanced to the cecum, which was identified by both the appendix and ileocecal valve. No adverse events experienced. The quality of the prep was good.  (Suprep was used)  The instrument was then slowly withdrawn as the colon was fully examined. Estimated blood loss is zero unless otherwise noted in this procedure report.    COLON FINDINGS: Two sessile polyps ranging from 4 to 56mm in size with mucous caps were found in the ascending colon and transverse colon.  Polypectomies were performed with a cold snare.  The resection was complete, the polyp tissue was completely retrieved and sent to histology.   Two polyps, semi-pedunculated and sessile, ranging from 5 to 40mm in size were found in the sigmoid colon and rectum.   Polypectomies were performed with a cold snare.  The resection was complete, the polyp tissue was completely retrieved and sent to histology.   There was mild diverticulosis noted in the descending colon and sigmoid colon.  Retroflexed views revealed internal hemorrhoids. The time to cecum = 2.1 Withdrawal time = 12.8   The scope was withdrawn and the procedure completed.  COMPLICATIONS: There were no immediate complications.  ENDOSCOPIC IMPRESSION: 1.   Two sessile polyps ranging from 4 to 48mm in size were found in the ascending colon and transverse colon; polypectomies were performed with a cold snare 2.   Two polyps, one semi-pedunculated and one sessile, ranging from 5 to 78mm in size were found in the sigmoid colon and rectum; polypectomies were performed with a cold snare 3.   Mild diverticulosis was noted in the descending colon and sigmoid colon  RECOMMENDATIONS: 1.  Await pathology results 2.  High fiber diet 3.  Timing of repeat colonoscopy will be determined by pathology findings. 4.  You will receive a letter within 1-2 weeks with the results of your biopsy as well as final recommendations.  Please call my office if you have not received a letter after 3 weeks.  eSigned:  Jerene Bears, MD 09/02/2015 8:29 AM   cc:  the patient, Dr. Silvio Pate   PATIENT NAME:  Philip, Mack MR#: [id number]

## 2015-09-02 NOTE — Patient Instructions (Signed)
Impressions/recommendations:  Polyps (handout given) Diverticulosis (handout given) High Fiber Diet (handout given)  YOU HAD AN ENDOSCOPIC PROCEDURE TODAY AT THE [facility]:   Refer to the procedure report that was given to you for any specific questions about what was found during the examination.  If the procedure report does not answer your questions, please call your gastroenterologist to clarify.  If you requested that your care partner not be given the details of your procedure findings, then the procedure report has been included in a sealed envelope for you to review at your convenience later.  YOU SHOULD EXPECT: Some feelings of bloating in the abdomen. Passage of more gas than usual.  Walking can help get rid of the air that was put into your GI tract during the procedure and reduce the bloating. If you had a lower endoscopy (such as a colonoscopy or flexible sigmoidoscopy) you may notice spotting of blood in your stool or on the toilet paper. If you underwent a bowel prep for your procedure, you may not have a normal bowel movement for a few days.  Please Note:  You might notice some irritation and congestion in your nose or some drainage.  This is from the oxygen used during your procedure.  There is no need for concern and it should clear up in a day or so.  SYMPTOMS TO REPORT IMMEDIATELY:   Following lower endoscopy (colonoscopy or flexible sigmoidoscopy):  Excessive amounts of blood in the stool  Significant tenderness or worsening of abdominal pains  Swelling of the abdomen that is new, acute  Fever of 100F or higher  For urgent or emergent issues, a gastroenterologist can be reached at any hour by calling [phone].   DIET: Your first meal following the procedure should be a small meal and then it is ok to progress to your normal diet. Heavy or fried foods are harder to digest and may make you feel nauseous or bloated.  Likewise, meals heavy in dairy and  vegetables can increase bloating.  Drink plenty of fluids but you should avoid alcoholic beverages for 24 hours.  ACTIVITY:  You should plan to take it easy for the rest of today and you should NOT DRIVE or use heavy machinery until tomorrow (because of the sedation medicines used during the test).    FOLLOW UP: Our staff will call the number listed on your records the next business day following your procedure to check on you and address any questions or concerns that you may have regarding the information given to you following your procedure. If we do not reach you, we will leave a message.  However, if you are feeling well and you are not experiencing any problems, there is no need to return our call.  We will assume that you have returned to your regular daily activities without incident.  If any biopsies were taken you will be contacted by phone or by letter within the next 1-3 weeks.  Please call us at [phone] if you have not heard about the biopsies in 3 weeks.    SIGNATURES/CONFIDENTIALITY: You and/or your care partner have signed paperwork which will be entered into your electronic medical record.  These signatures attest to the fact that that the information above on your After Visit Summary has been reviewed and is understood.  Full responsibility of the confidentiality of this discharge information lies with you and/or your care-partner. 

## 2015-09-02 NOTE — Progress Notes (Signed)
Called to room to assist during endoscopic procedure.  Patient ID and intended procedure confirmed with present staff. Received instructions for my participation in the procedure from the performing physician.  

## 2015-09-02 NOTE — Progress Notes (Signed)
Report to PACU, RN, vss, BBS= Clear.  

## 2015-09-03 ENCOUNTER — Telehealth: Payer: Self-pay

## 2015-09-03 NOTE — Telephone Encounter (Signed)
  Follow up Call-  Call back number 09/02/2015  Post procedure Call Back phone  # [phone]  Permission to leave phone message Yes     Patient questions:  Do you have a fever, pain , or abdominal swelling? No. Pain Score  0 *  Have you tolerated food without any problems? Yes.    Have you been able to return to your normal activities? Yes.    Do you have any questions about your discharge instructions: Diet   No. Medications  No. Follow up visit  No.  Do you have questions or concerns about your Care? No.  Actions: * If pain score is 4 or above: No action needed, pain <4.  Pt was not available, I spoke with his wife.  She reported, "he is fine". maw

## 2015-09-08 ENCOUNTER — Encounter: Payer: Self-pay | Admitting: Internal Medicine

## 2015-10-29 ENCOUNTER — Ambulatory Visit (INDEPENDENT_AMBULATORY_CARE_PROVIDER_SITE_OTHER): Payer: Federal, State, Local not specified - PPO | Admitting: Internal Medicine

## 2015-10-29 ENCOUNTER — Encounter: Payer: Self-pay | Admitting: Internal Medicine

## 2015-10-29 VITALS — BP 138/80 | HR 64 | Temp 98.1°F | Wt 221.0 lb

## 2015-10-29 DIAGNOSIS — M25511 Pain in right shoulder: Secondary | ICD-10-CM

## 2015-10-29 NOTE — Progress Notes (Signed)
   Subjective:    Patient ID: Philip Mack, male    DOB: December 17, 1950, 65 y.o.   MRN: [id number]  HPI Here due to ongoing right shoulder pain  Not getting better Thinks it may be related to neck and radiating Trouble getting comfortable in the evening just sitting there---ache and discomfort  Still works with [employer] 4 days per week All sorts of stuff but a lot of framing Notes some subtle weakness in right hand Will occasionally awaken with aching in the right arm (or numbness---will fall asleep) Also feels pulling along trapezius at times Does weight work at [employer]--some upper body. May have decreased weight a little (did miss some time with travel)  No meds for this  No current outpatient prescriptions on file prior to visit.   No current facility-administered medications on file prior to visit.    No Known Allergies  Past Medical History  Diagnosis Date  . Hyperlipidemia     Past Surgical History  Procedure Laterality Date  . Hernia repair  1959    Bilateral, inguinal  . Tonsillectomy and adenoidectomy  1961  . Arm injury  1975    Family History  Problem Relation Age of Onset  . Cancer Mother     Lung, (surgically resected)  . Heart disease Mother   . Hypertension Neg Hx   . Diabetes Neg Hx   . Colon cancer Neg Hx   . Hearing loss Other     On Mom's side    Social History   Social History  . Marital Status: Married    Spouse Name: N/A  . Number of Children: 2  . Years of Education: N/A   Occupational History  . Retired Psychologist, sport and exercise    Social History Main Topics  . Smoking status: Former Smoker    Types: Cigarettes    Quit date: 08/30/1980  . Smokeless tobacco: Never Used  . Alcohol Use: Yes     Comment: Occasionally  . Drug Use: No  . Sexual Activity: Not on file   Other Topics Concern  . Not on file   Social History Narrative   Review of Systems Not feeling sick No fever    Objective:   Physical Exam  Constitutional: He  appears well-developed and well-nourished. No distress.  Neck: Neck supple.  Slight stiffness with rotation to left  Musculoskeletal:  Fairly normal ROM except sig pain with external rotation (goes about 75%) No rotator cuff findings No tenderness in tendon or bursa  Neurological:  No arm weakness          Assessment & Plan:

## 2015-10-29 NOTE — Assessment & Plan Note (Signed)
Doesn't have weakness--but still could be radicular from neck. 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

## 2015-10-29 NOTE — Progress Notes (Signed)
Pre visit review using our clinic review tool, if applicable. No additional management support is needed unless otherwise documented below in the visit note. 

## 2016-07-14 ENCOUNTER — Ambulatory Visit (INDEPENDENT_AMBULATORY_CARE_PROVIDER_SITE_OTHER): Payer: Medicare Other | Admitting: Internal Medicine

## 2016-07-14 ENCOUNTER — Encounter: Payer: Self-pay | Admitting: Internal Medicine

## 2016-07-14 VITALS — BP 124/86 | HR 68 | Temp 98.2°F | Ht 75.0 in | Wt 215.0 lb

## 2016-07-14 DIAGNOSIS — G8929 Other chronic pain: Secondary | ICD-10-CM | POA: Diagnosis not present

## 2016-07-14 DIAGNOSIS — E785 Hyperlipidemia, unspecified: Secondary | ICD-10-CM

## 2016-07-14 DIAGNOSIS — Z23 Encounter for immunization: Secondary | ICD-10-CM

## 2016-07-14 DIAGNOSIS — Z0000 Encounter for general adult medical examination without abnormal findings: Secondary | ICD-10-CM | POA: Diagnosis not present

## 2016-07-14 DIAGNOSIS — Z7189 Other specified counseling: Secondary | ICD-10-CM | POA: Diagnosis not present

## 2016-07-14 DIAGNOSIS — M25511 Pain in right shoulder: Secondary | ICD-10-CM | POA: Diagnosis not present

## 2016-07-14 LAB — CBC WITH DIFFERENTIAL/PLATELET
BASOS PCT: 0.3 % (ref 0.0–3.0)
Basophils Absolute: 0 10*3/uL (ref 0.0–0.1)
EOS ABS: 0 10*3/uL (ref 0.0–0.7)
EOS PCT: 0.7 % (ref 0.0–5.0)
HCT: 43.4 % (ref 39.0–52.0)
HEMOGLOBIN: 14.4 g/dL (ref 13.0–17.0)
LYMPHS ABS: 1.5 10*3/uL (ref 0.7–4.0)
Lymphocytes Relative: 24.4 % (ref 12.0–46.0)
MCHC: 33.3 g/dL (ref 30.0–36.0)
MCV: 89.3 fl (ref 78.0–100.0)
MONO ABS: 0.7 10*3/uL (ref 0.1–1.0)
Monocytes Relative: 10.9 % (ref 3.0–12.0)
NEUTROS ABS: 4 10*3/uL (ref 1.4–7.7)
NEUTROS PCT: 63.7 % (ref 43.0–77.0)
PLATELETS: 193 10*3/uL (ref 150.0–400.0)
RBC: 4.86 Mil/uL (ref 4.22–5.81)
RDW: 13.9 % (ref 11.5–15.5)
WBC: 6.3 10*3/uL (ref 4.0–10.5)

## 2016-07-14 LAB — LIPID PANEL
CHOLESTEROL: 213 mg/dL — AB (ref 0–200)
HDL: 67.9 mg/dL (ref >39.00)
LDL CALC: 130 mg/dL — AB (ref 0–99)
NonHDL: 145.02
TRIGLYCERIDES: 76 mg/dL (ref 0.0–149.0)
Total CHOL/HDL Ratio: 3
VLDL: 15.2 mg/dL (ref 0.0–40.0)

## 2016-07-14 LAB — COMPREHENSIVE METABOLIC PANEL
ALBUMIN: 4.5 g/dL (ref 3.5–5.2)
ALT: 20 U/L (ref 0–53)
AST: 25 U/L (ref 0–37)
Alkaline Phosphatase: 51 U/L (ref 39–117)
BUN: 13 mg/dL (ref 6–23)
CHLORIDE: 104 meq/L (ref 96–112)
CO2: 28 mEq/L (ref 19–32)
CREATININE: 0.89 mg/dL (ref 0.40–1.50)
Calcium: 9.7 mg/dL (ref 8.4–10.5)
GFR: 91.17 mL/min (ref >60.00)
GLUCOSE: 80 mg/dL (ref 70–99)
POTASSIUM: 4 meq/L (ref 3.5–5.1)
SODIUM: 138 meq/L (ref 135–145)
Total Bilirubin: 0.6 mg/dL (ref 0.2–1.2)
Total Protein: 7.1 g/dL (ref 6.0–8.3)

## 2016-07-14 NOTE — Progress Notes (Signed)
Subjective:    Patient ID: Philip Mack, male    DOB: Apr 10, 1951, 65 y.o.   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   No current facility-administered medications on file prior to visit.     No Known Allergies  Past Medical History:  Diagnosis Date  . Hyperlipidemia     Past Surgical History:  Procedure Laterality Date  . Arm injury  1975  . HERNIA REPAIR  1959   Bilateral, inguinal  . TONSILLECTOMY AND ADENOIDECTOMY  1961    Family History  Problem Relation Age of Onset  . Cancer Mother     Lung, (surgically resected)  . Heart disease Mother   . Hearing loss Other     On Mom's side  . Hypertension Neg Hx   . Diabetes Neg Hx   . Colon cancer Neg Hx     Social History   Social History  . Marital status: Married    Spouse name: N/A  . Number of children: 2  . Years of education: N/A   Occupational History  . Retired Psychologist, sport and exercise    Social History Main Topics  . Smoking status: Former Smoker    Types: Cigarettes    Quit date: 08/30/1980  . Smokeless tobacco: Never Used  . Alcohol use Yes     Comment: Occasionally  . Drug use: No  . Sexual activity: Not on file   Other Topics Concern  . Not on file     Social History Narrative   Has living will   Wife is health care POA-- then son Randall Hiss   Would accept resuscitation   No tube feeds if cognitively unaware   Review of Systems Appetite is good Weight is stable Sleeps well Teeth are okay--regular with dentist and periodontist (Dr Barry Dienes.[place]) No rash or suspicious skin lesions Wears seat belt No chest pain or SOB No palpitations No dizziness or syncope No edema Bowels are fine--no blood Voids okay--no symptoms like slow stream, etc No heartburn or swallowing problems    Objective:   Physical Exam  Constitutional: He is oriented to person, place, and time. He appears well-developed and well-nourished. No distress.  HENT:  Mouth/Throat: Oropharynx is clear and moist. No oropharyngeal exudate.  Neck: Normal range of motion. Neck supple. No thyromegaly present.  Cardiovascular: Normal rate, regular rhythm, normal heart sounds and intact distal pulses.  Exam reveals no gallop.   No murmur heard. Pulmonary/Chest: Effort normal and breath sounds normal. No respiratory distress. He has no wheezes. He has no rales.  Abdominal: Soft. There is no tenderness.  Musculoskeletal: He exhibits no edema or tenderness.  Lymphadenopathy:    He has no cervical adenopathy.  Neurological: He is alert and oriented to person, place, and time.  President-- "Daisy Floro, Ophelia Shoulder" [id number] Recall  3/3  Skin: No rash noted. No erythema.  Psychiatric: He has a normal mood and affect. His behavior is normal.          Assessment & Plan:

## 2016-07-14 NOTE — Assessment & Plan Note (Addendum)
I have personally reviewed the Medicare Annual Wellness questionnaire and have noted 1. The patient's medical and social history 2. Their use of alcohol, tobacco or illicit drugs 3. Their current medications and supplements 4. The patient's functional ability including ADL's, fall risks, home safety risks and hearing or visual             impairment. 5. Diet and physical activities 6. Evidence for depression or mood disorders  The patients weight, height, BMI and visual acuity have been recorded in the chart I have made referrals, counseling and provided education to the patient based review of the above and I have provided the pt with a written personalized care plan for preventive services.  I have provided you with a copy of your personalized plan for preventive services. Please take the time to review along with your updated medication list.  EKG normal Prevnar/flu vaccine today Defer PSA to next year UTD on colon Works out a lot--good shape

## 2016-07-14 NOTE — Assessment & Plan Note (Signed)
Average risk profile with good HDL Discussed meds---will hold off

## 2016-07-14 NOTE — Progress Notes (Signed)
Pre visit review using our clinic review tool, if applicable. No additional management support is needed unless otherwise documented below in the visit note. 

## 2016-07-14 NOTE — Assessment & Plan Note (Signed)
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

## 2016-07-14 NOTE — Assessment & Plan Note (Signed)
See social history 

## 2016-11-17 DIAGNOSIS — M542 Cervicalgia: Secondary | ICD-10-CM | POA: Diagnosis not present

## 2016-11-17 DIAGNOSIS — M25511 Pain in right shoulder: Secondary | ICD-10-CM | POA: Diagnosis not present

## 2016-12-22 DIAGNOSIS — M542 Cervicalgia: Secondary | ICD-10-CM | POA: Diagnosis not present

## 2016-12-22 DIAGNOSIS — M25511 Pain in right shoulder: Secondary | ICD-10-CM | POA: Diagnosis not present

## 2017-07-15 ENCOUNTER — Ambulatory Visit (INDEPENDENT_AMBULATORY_CARE_PROVIDER_SITE_OTHER): Payer: Medicare Other | Admitting: Internal Medicine

## 2017-07-15 ENCOUNTER — Encounter: Payer: Self-pay | Admitting: Internal Medicine

## 2017-07-15 VITALS — BP 114/80 | HR 55 | Temp 97.4°F | Ht 75.0 in | Wt 218.0 lb

## 2017-07-15 DIAGNOSIS — Z23 Encounter for immunization: Secondary | ICD-10-CM | POA: Diagnosis not present

## 2017-07-15 DIAGNOSIS — N401 Enlarged prostate with lower urinary tract symptoms: Secondary | ICD-10-CM | POA: Insufficient documentation

## 2017-07-15 DIAGNOSIS — E785 Hyperlipidemia, unspecified: Secondary | ICD-10-CM | POA: Diagnosis not present

## 2017-07-15 DIAGNOSIS — N486 Induration penis plastica: Secondary | ICD-10-CM | POA: Diagnosis not present

## 2017-07-15 DIAGNOSIS — Z0000 Encounter for general adult medical examination without abnormal findings: Secondary | ICD-10-CM | POA: Diagnosis not present

## 2017-07-15 DIAGNOSIS — G8929 Other chronic pain: Secondary | ICD-10-CM | POA: Diagnosis not present

## 2017-07-15 DIAGNOSIS — M25511 Pain in right shoulder: Secondary | ICD-10-CM | POA: Diagnosis not present

## 2017-07-15 DIAGNOSIS — Z7189 Other specified counseling: Secondary | ICD-10-CM | POA: Diagnosis not present

## 2017-07-15 DIAGNOSIS — N138 Other obstructive and reflux uropathy: Secondary | ICD-10-CM

## 2017-07-15 LAB — COMPREHENSIVE METABOLIC PANEL
ALK PHOS: 50 U/L (ref 39–117)
ALT: 18 U/L (ref 0–53)
AST: 22 U/L (ref 0–37)
Albumin: 4.5 g/dL (ref 3.5–5.2)
BUN: 11 mg/dL (ref 6–23)
CO2: 29 meq/L (ref 19–32)
Calcium: 10 mg/dL (ref 8.4–10.5)
Chloride: 104 mEq/L (ref 96–112)
Creatinine, Ser: 0.88 mg/dL (ref 0.40–1.50)
GFR: 92.08 mL/min (ref >60.00)
GLUCOSE: 94 mg/dL (ref 70–99)
POTASSIUM: 4.2 meq/L (ref 3.5–5.1)
Sodium: 139 mEq/L (ref 135–145)
Total Bilirubin: 0.9 mg/dL (ref 0.2–1.2)
Total Protein: 6.8 g/dL (ref 6.0–8.3)

## 2017-07-15 LAB — PSA: PSA: 0.36 ng/mL (ref 0.10–4.00)

## 2017-07-15 LAB — CBC
HEMATOCRIT: 44.9 % (ref 39.0–52.0)
Hemoglobin: 14.9 g/dL (ref 13.0–17.0)
MCHC: 33.2 g/dL (ref 30.0–36.0)
MCV: 91.4 fl (ref 78.0–100.0)
Platelets: 177 10*3/uL (ref 150.0–400.0)
RBC: 4.92 Mil/uL (ref 4.22–5.81)
RDW: 13.6 % (ref 11.5–15.5)
WBC: 6 10*3/uL (ref 4.0–10.5)

## 2017-07-15 NOTE — Assessment & Plan Note (Signed)
Low risk profile given high HDL---no Rx

## 2017-07-15 NOTE — Progress Notes (Signed)
Subjective:    Patient ID: Philip Mack, male    DOB: Dec 20, 1950, 66 y.o.   MRN: [id number]  HPI Here for Medicare wellness visit and follow up of chronic health conditions Reviewed form and advanced directives Reviewed other doctors Occasional beer No tobacco Still exercises regularly Vision and hearing are good No falls No depression or anhedonia Independent with instrumental ADLs No sig memory problems  Has curved erection Noticed over the past 6 months No pain Has avoided sex due to the severity  No general problems voiding Stream is slow but no dribbling, etc Nocturia usually once  Reviewed cholesterol levels Fairly low risk profile  Given meloxicam from Dr Mardelle Matte Uses it every 2-3 days---really helped right shoulder 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 meloxicam (MOBIC) 15 MG tablet Take 1 tablet daily by mouth.  1   No current facility-administered medications on file prior to visit.     No Known Allergies  Past Medical History:  Diagnosis Date  . Hyperlipidemia     Past Surgical History:  Procedure Laterality Date  . Arm injury  1975  . HERNIA REPAIR  1959   Bilateral, inguinal  . TONSILLECTOMY AND ADENOIDECTOMY  1961    Family History  Problem Relation Age of Onset  . Cancer Mother        Lung, (surgically resected)  . Heart disease Mother   . Hearing loss Other        On Mom's side  . Hypertension Neg Hx   . Diabetes Neg Hx   . Colon cancer Neg Hx     Social History   Socioeconomic History  . Marital status: Married    Spouse name: Not on file  . Number of children: 2  . Years of education: Not on file  . Highest education level: Not on file  Social Needs  . Financial resource strain: Not on file  . Food insecurity - worry: Not on file  . Food insecurity - inability: Not on file  . Transportation needs - medical: Not on  file  . Transportation needs - non-medical: Not on file  Occupational History  . Occupation: Retired Human resources officer  . Smoking status: Former Smoker    Types: Cigarettes    Last attempt to quit: 08/30/1980    Years since quitting: 36.8  . Smokeless tobacco: Never Used  Substance and Sexual Activity  . Alcohol use: Yes    Comment: Occasionally  . Drug use: No  . Sexual activity: Not on file  Other Topics Concern  . Not on file  Social History Narrative   Has living will   Wife is health care POA-- then son Philip Mack   Would accept resuscitation   No tube feeds if cognitively unaware   Review of Systems Wears seat belt No skin problems Appetite is good Weight stable No problems with teeth--keeps up with dentist No heartburn or dysphagia No chest pain or palpitations No SOB No dizziness or syncope Sleeps well No back problems or other joint pains Bowels are fine. No blood    Objective:   Physical Exam  Constitutional: He is oriented to person, place, and time. He appears well-developed. No distress.  HENT:  Mouth/Throat: Oropharynx is clear and moist. No oropharyngeal exudate.  Neck: No thyromegaly present.  Cardiovascular: Normal rate, regular rhythm, normal heart sounds and intact distal pulses. Exam  reveals no gallop.  No murmur heard. Pulmonary/Chest: Effort normal and breath sounds normal. No respiratory distress. He has no wheezes. He has no rales.  Abdominal: Soft. He exhibits no distension. There is no tenderness. There is no rebound and no guarding.  Musculoskeletal: He exhibits no edema or tenderness.  Lymphadenopathy:    He has no cervical adenopathy.  Neurological: He is alert and oriented to person, place, and time.  President--- "Daisy Floro, Barack Abbe [place] Bush" [phone] D-l-r-o-w Recall 3/3  Skin: No rash noted. No erythema.  Psychiatric: He has a normal mood and affect. His behavior is normal.            Assessment & Plan:

## 2017-07-15 NOTE — Assessment & Plan Note (Signed)
Better with meloxicam Will check labs

## 2017-07-15 NOTE — Assessment & Plan Note (Signed)
Mild symptoms only  No Rx as yet

## 2017-07-15 NOTE — Progress Notes (Signed)
Hearing Screening   Method: Audiometry   125Hz  250Hz  500Hz  1000Hz  2000Hz  3000Hz  4000Hz  6000Hz  8000Hz   Right ear:   20 20 20   0    Left ear:   20 20 20   0    Vision Screening Comments: July 2018

## 2017-07-15 NOTE — Assessment & Plan Note (Signed)
See social history 

## 2017-07-15 NOTE — Assessment & Plan Note (Signed)
I have personally reviewed the Medicare Annual Wellness questionnaire and have noted 1. The patient's medical and social history 2. Their use of alcohol, tobacco or illicit drugs 3. Their current medications and supplements 4. The patient's functional ability including ADL's, fall risks, home safety risks and hearing or visual             impairment. 5. Diet and physical activities 6. Evidence for depression or mood disorders  The patients weight, height, BMI and visual acuity have been recorded in the chart I have made referrals, counseling and provided education to the patient based review of the above and I have provided the pt with a written personalized care plan for preventive services.  I have provided you with a copy of your personalized plan for preventive services. Please take the time to review along with your updated medication list.  FLu vaccine and pneumovax Colon due 1/20 Will check PSA after discussion Good about fitness

## 2017-07-15 NOTE — Addendum Note (Signed)
Addended by: Pilar Grammes on: 07/15/2017 11:31 AM   Modules accepted: Orders

## 2017-07-15 NOTE — Assessment & Plan Note (Signed)
Will set up evaluation with urologist

## 2017-08-03 ENCOUNTER — Encounter: Payer: Self-pay | Admitting: Urology

## 2017-08-03 ENCOUNTER — Ambulatory Visit (INDEPENDENT_AMBULATORY_CARE_PROVIDER_SITE_OTHER): Payer: Medicare Other | Admitting: Urology

## 2017-08-03 ENCOUNTER — Other Ambulatory Visit: Payer: Self-pay

## 2017-08-03 VITALS — BP 133/76 | HR 63 | Ht 75.0 in | Wt 220.6 lb

## 2017-08-03 DIAGNOSIS — N486 Induration penis plastica: Secondary | ICD-10-CM

## 2017-08-03 MED ORDER — PENTOXIFYLLINE ER 400 MG PO TBCR: 400.0000 mg | EXTENDED_RELEASE_TABLET | Freq: Three times a day (TID) | ORAL | 2 refills | Status: DC

## 2017-08-03 NOTE — Progress Notes (Signed)
08/03/2017 11:01 AM   Philip Mack 08-14-1951 [id number]  Referring provider: Venia Carbon, MD [address] [place], [place] [address]  Chief Complaint  Patient presents with  . Abnormal Penile Curvature    HPI: Philip Mack is a 66 y.o. male seen at the request of Dr. Silvio Pate for evaluation of penile curvature.  He states approximately 6 months ago he noted penile curvature with erections to the left.  This has progressed to the point where it is greater than 45 but less than 90 degrees.  He thinks he may have an hourglass deformity.  The curvature has been stable for an unknown period of time.  He denies pain with erections.  He has no significant erectile dysfunction.  There is no previous history of penile trauma/fracture or Dupuytren's contracture.  He states his wife had a hysterectomy for cancer approximately 2 years ago and his sexual activity is very infrequent.  The times they have had intercourse he has been with the degree of curvature.  He denies previous history of urologic problems or prior urologic evaluation.   PMH: Past Medical History:  Diagnosis Date  . Hepatitis   . Hyperlipidemia     Surgical History: Past Surgical History:  Procedure Laterality Date  . Arm injury  1975  . HERNIA REPAIR  1959   Bilateral, inguinal  . TONSILLECTOMY AND ADENOIDECTOMY  1961    Home Medications:  Allergies as of 08/03/2017   No Known Allergies     Medication List        Accurate as of 08/03/17 11:01 AM. Always use your most recent med list.          meloxicam 15 MG tablet Commonly known as:  MOBIC Take 1 tablet daily by mouth.       Allergies: No Known Allergies  Family History: Family History  Problem Relation Age of Onset  . Cancer Mother        Lung, (surgically resected)  . Heart disease Mother   . Hearing loss Other        On Mom's side  . Hypertension Neg Hx   . Diabetes Neg Hx   . Colon cancer Neg Hx     Social History:   reports that he quit smoking about 36 years ago. His smoking use included cigarettes. he has never used smokeless tobacco. He reports that he drinks alcohol. He reports that he does not use drugs.  ROS: UROLOGY Frequent Urination?: No Hard to postpone urination?: No Burning/pain with urination?: No Get up at night to urinate?: Yes Leakage of urine?: No Urine stream starts and stops?: No Trouble starting stream?: No Do you have to strain to urinate?: No Blood in urine?: No Urinary tract infection?: No Sexually transmitted disease?: No Injury to kidneys or bladder?: No Painful intercourse?: No Weak stream?: No Erection problems?: No Penile pain?: No  Gastrointestinal Nausea?: No Vomiting?: No Indigestion/heartburn?: No Diarrhea?: No Constipation?: No  Constitutional Fever: No Night sweats?: No Weight loss?: No Fatigue?: No  Skin Skin rash/lesions?: No Itching?: No  Eyes Blurred vision?: No Double vision?: No  Ears/Nose/Throat Sore throat?: No Sinus problems?: No  Hematologic/Lymphatic Swollen glands?: No Easy bruising?: No  Cardiovascular Leg swelling?: No Chest pain?: No  Respiratory Cough?: No Shortness of breath?: No  Endocrine Excessive thirst?: No  Musculoskeletal Back pain?: No Joint pain?: No  Neurological Headaches?: No Dizziness?: No  Psychologic Depression?: No Anxiety?: No  Physical Exam: BP 133/76   Pulse 63  Ht 6\' 3"  (1.905 m)   Wt 220 lb 9.6 oz (100.1 kg)   BMI 27.57 kg/m   Constitutional:  Alert and oriented, No acute distress. HEENT: [place] AT, moist mucus membranes.  Trachea midline, no masses. Cardiovascular: No clubbing, cyanosis, or edema. Respiratory: Normal respiratory effort, no increased work of breathing. GI: Abdomen is soft, nontender, nondistended, no abdominal masses GU: No CVA tenderness.  Penis circumcised, approximately 1 cm dorsal plaque mid-distal shaft.  Testes descended bilaterally without masses or  tenderness, cord/epididymes palpably normal.  Stretched penile length normal Skin: No rashes, bruises or suspicious lesions. Lymph: No cervical or inguinal adenopathy. Neurologic: Grossly intact, no focal deficits, moving all 4 extremities. Psychiatric: Normal mood and affect.  Laboratory Data: Lab Results  Component Value Date   WBC 6.0 07/15/2017   HGB 14.9 07/15/2017   HCT 44.9 07/15/2017   MCV 91.4 07/15/2017   PLT 177.0 07/15/2017    Lab Results  Component Value Date   CREATININE 0.88 07/15/2017    Assessment & Plan:    1. Peyronie's disease We discussed the diagnosis of Peyronie's disease and its unknown etiology.  He was informed there are no oral medications approved for this condition however several are used off label.  The use of Xiaflex was discussed as well as surgical treatment options.  He states due to the infrequency of his sexual activity he would not be interested in surgery or Xiaflex.  He would be interested in a trial of medication. Rx pentoxifylline 400 mg x 90 days was sent to his pharmacy.  Follow-up 3 months.  He will bring in photographs of his erection at the next visit.   Abbie Sons, [place] [address] [place], [place] [address] [phone]

## 2017-09-29 DIAGNOSIS — H2513 Age-related nuclear cataract, bilateral: Secondary | ICD-10-CM | POA: Diagnosis not present

## 2017-11-02 ENCOUNTER — Ambulatory Visit (INDEPENDENT_AMBULATORY_CARE_PROVIDER_SITE_OTHER): Payer: Medicare Other | Admitting: Urology

## 2017-11-02 ENCOUNTER — Encounter: Payer: Self-pay | Admitting: Urology

## 2017-11-02 VITALS — BP 138/72 | HR 59 | Ht 75.0 in | Wt 226.0 lb

## 2017-11-02 DIAGNOSIS — N486 Induration penis plastica: Secondary | ICD-10-CM

## 2017-11-02 NOTE — Progress Notes (Signed)
11/02/2017 11:31 AM   Philip Mack April 04, 1951 [id number]  Referring provider: Venia Carbon, MD [address] [place], [place] [address]  Chief Complaint  Patient presents with  . Abnormal Penile Curvature    HPI: 67 year old male presents for follow-up of Peyronie's disease.  He was initially seen on 08/03/2017 and elected a trial of pentoxifylline for 90 days.  He presents for a 29-month follow-up and has seen no improvement or worsening of his curvature.   PMH: Past Medical History:  Diagnosis Date  . Hepatitis   . Hyperlipidemia     Surgical History: Past Surgical History:  Procedure Laterality Date  . Arm injury  1975  . HERNIA REPAIR  1959   Bilateral, inguinal  . TONSILLECTOMY AND ADENOIDECTOMY  1961    Home Medications:  Allergies as of 11/02/2017   No Known Allergies     Medication List        Accurate as of 11/02/17 11:31 AM. Always use your most recent med list.          meloxicam 15 MG tablet Commonly known as:  MOBIC Take 1 tablet daily by mouth.       Allergies: No Known Allergies  Family History: Family History  Problem Relation Age of Onset  . Cancer Mother        Lung, (surgically resected)  . Heart disease Mother   . Hearing loss Other        On Mom's side  . Hypertension Neg Hx   . Diabetes Neg Hx   . Colon cancer Neg Hx     Social History:  reports that he quit smoking about 37 years ago. His smoking use included cigarettes. he has never used smokeless tobacco. He reports that he drinks alcohol. He reports that he does not use drugs.  ROS: UROLOGY Frequent Urination?: No Hard to postpone urination?: No Burning/pain with urination?: No Get up at night to urinate?: No Leakage of urine?: No Urine stream starts and stops?: No Trouble starting stream?: No Do you have to strain to urinate?: No Blood in urine?: No Urinary tract infection?: No Sexually transmitted disease?: No Injury to kidneys or bladder?:  No Painful intercourse?: No Weak stream?: No Erection problems?: Yes Penile pain?: No  Gastrointestinal Nausea?: No Vomiting?: No Indigestion/heartburn?: No Diarrhea?: No Constipation?: No  Constitutional Fever: No Night sweats?: No Weight loss?: No Fatigue?: No  Skin Skin rash/lesions?: No Itching?: No  Eyes Blurred vision?: No Double vision?: No  Ears/Nose/Throat Sore throat?: No Sinus problems?: No  Hematologic/Lymphatic Swollen glands?: No Easy bruising?: No  Cardiovascular Leg swelling?: No Chest pain?: No  Respiratory Cough?: No Shortness of breath?: No  Endocrine Excessive thirst?: No  Musculoskeletal Back pain?: No Joint pain?: No  Neurological Headaches?: No Dizziness?: No  Psychologic Depression?: No Anxiety?: No  Physical Exam: BP 138/72 (BP Location: Right Arm, Patient Position: Sitting, Cuff Size: Normal)   Pulse (!) 59   Ht 6\' 3"  (1.905 m)   Wt 226 lb (102.5 kg)   BMI 28.25 kg/m   Constitutional:  Alert and oriented, No acute distress. HEENT: [place] AT, moist mucus membranes.  Trachea midline, no masses. Cardiovascular: No clubbing, cyanosis, or edema. Respiratory: Normal respiratory effort, no increased work of breathing. GI: Abdomen is soft, nontender, nondistended, no abdominal masses GU: No CVA tenderness Lymph: No cervical or inguinal lymphadenopathy. Skin: No rashes, bruises or suspicious lesions. Neurologic: Grossly intact, no focal deficits, moving all 4 extremities. Psychiatric: Normal mood and  affect.  Laboratory Data: Lab Results  Component Value Date   WBC 6.0 07/15/2017   HGB 14.9 07/15/2017   HCT 44.9 07/15/2017   MCV 91.4 07/15/2017   PLT 177.0 07/15/2017    Lab Results  Component Value Date   CREATININE 0.88 07/15/2017    Lab Results  Component Value Date   PSA 0.36 07/15/2017   PSA 0.28 07/14/2015   PSA 0.32 05/25/2013      Assessment & Plan:    1. Peyronie's disease Stable Peyronie's  disease.  No improvement on pentoxifylline which has been used off label.  I discussed Xiaflex.  He states his curvature is not bothersome enough that he desires additional treatment at this time.  He will return as needed for any significant change or if he decides to pursue Xiaflex.    Abbie Sons, [place] [address], [place], [place] [address] [phone]

## 2018-07-19 ENCOUNTER — Encounter: Payer: Self-pay | Admitting: Internal Medicine

## 2018-07-19 ENCOUNTER — Ambulatory Visit (INDEPENDENT_AMBULATORY_CARE_PROVIDER_SITE_OTHER): Payer: Medicare Other | Admitting: Internal Medicine

## 2018-07-19 VITALS — BP 118/82 | HR 62 | Temp 97.8°F | Wt 214.0 lb

## 2018-07-19 DIAGNOSIS — Z23 Encounter for immunization: Secondary | ICD-10-CM | POA: Diagnosis not present

## 2018-07-19 DIAGNOSIS — Z0000 Encounter for general adult medical examination without abnormal findings: Secondary | ICD-10-CM

## 2018-07-19 DIAGNOSIS — Z7189 Other specified counseling: Secondary | ICD-10-CM | POA: Diagnosis not present

## 2018-07-19 DIAGNOSIS — N401 Enlarged prostate with lower urinary tract symptoms: Secondary | ICD-10-CM | POA: Diagnosis not present

## 2018-07-19 DIAGNOSIS — N138 Other obstructive and reflux uropathy: Secondary | ICD-10-CM

## 2018-07-19 DIAGNOSIS — E785 Hyperlipidemia, unspecified: Secondary | ICD-10-CM | POA: Diagnosis not present

## 2018-07-19 LAB — COMPREHENSIVE METABOLIC PANEL
ALT: 20 U/L (ref 0–53)
AST: 24 U/L (ref 0–37)
Albumin: 4.7 g/dL (ref 3.5–5.2)
Alkaline Phosphatase: 49 U/L (ref 39–117)
BILIRUBIN TOTAL: 0.8 mg/dL (ref 0.2–1.2)
BUN: 16 mg/dL (ref 6–23)
CO2: 28 meq/L (ref 19–32)
CREATININE: 0.9 mg/dL (ref 0.40–1.50)
Calcium: 9.9 mg/dL (ref 8.4–10.5)
Chloride: 103 mEq/L (ref 96–112)
GFR: 89.44 mL/min (ref >60.00)
Glucose, Bld: 98 mg/dL (ref 70–99)
Potassium: 4.2 mEq/L (ref 3.5–5.1)
Sodium: 137 mEq/L (ref 135–145)
Total Protein: 7.4 g/dL (ref 6.0–8.3)

## 2018-07-19 LAB — LIPID PANEL
Cholesterol: 218 mg/dL — ABNORMAL HIGH (ref 0–200)
HDL: 61.6 mg/dL (ref >39.00)
LDL Cholesterol: 144 mg/dL — ABNORMAL HIGH (ref 0–99)
NONHDL: 156.58
Total CHOL/HDL Ratio: 4
Triglycerides: 64 mg/dL (ref 0.0–149.0)
VLDL: 12.8 mg/dL (ref 0.0–40.0)

## 2018-07-19 LAB — CBC
HCT: 43.6 % (ref 39.0–52.0)
Hemoglobin: 14.7 g/dL (ref 13.0–17.0)
MCHC: 33.7 g/dL (ref 30.0–36.0)
MCV: 90.5 fl (ref 78.0–100.0)
PLATELETS: 184 10*3/uL (ref 150.0–400.0)
RBC: 4.82 Mil/uL (ref 4.22–5.81)
RDW: 13.6 % (ref 11.5–15.5)
WBC: 5.2 10*3/uL (ref 4.0–10.5)

## 2018-07-19 NOTE — Assessment & Plan Note (Signed)
I have personally reviewed the Medicare Annual Wellness questionnaire and have noted 1. The patient's medical and social history 2. Their use of alcohol, tobacco or illicit drugs 3. Their current medications and supplements 4. The patient's functional ability including ADL's, fall risks, home safety risks and hearing or visual             impairment. 5. Diet and physical activities 6. Evidence for depression or mood disorders  The patients weight, height, BMI and visual acuity have been recorded in the chart I have made referrals, counseling and provided education to the patient based review of the above and I have provided the pt with a written personalized care plan for preventive services.  I have provided you with a copy of your personalized plan for preventive services. Please take the time to review along with your updated medication list.  Colon due in January Defer PSA to next year Exercises regularly Flu vaccine today Will seek out shingrix at pharmacy

## 2018-07-19 NOTE — Progress Notes (Signed)
Hearing Screening   Method: Audiometry   125Hz  250Hz  500Hz  1000Hz  2000Hz  3000Hz  4000Hz  6000Hz  8000Hz   Right ear:   20 20 20  20     Left ear:   20 20 20  20     Vision Screening Comments: August 2018. Appt scheduled for 08-09-18

## 2018-07-19 NOTE — Progress Notes (Signed)
Subjective:    Patient ID: Philip Mack, male    DOB: 05-Mar-1951, 67 y.o.   MRN: [id number]  HPI Here for Medicare wellness visit and follow up of chronic health conditions Reviewed form and advanced directives Reviewed other doctors Still enjoys an occasional beer No tobacco Exercises regularly Vision and hearing are fine No falls No depression or anhedonia Independent with instrumental ADLs No memory problems  Did see urologist about the Peyronnie's Got Rx and it never made a difference Decided not to pursue other Rx  Still with right shoulder pain Uses the meloxicam every 2-3 days  Has noticed some right sided of face---lower cheek to earlobe No pain---just a nuisance and not persistent or troublesome  Voids okay Slow at times and occasional dribble Nocturia 0-2  Reviewed mildly elevated cholesterol No chest pain No SOB or change in exercise tolerance No dizziness or syncope No edema  Current Outpatient Medications on File Prior to Visit  Medication Sig Dispense Refill  . meloxicam (MOBIC) 15 MG tablet Take 1 tablet daily by mouth.  1   No current facility-administered medications on file prior to visit.     No Known Allergies  Past Medical History:  Diagnosis Date  . Hepatitis   . Hyperlipidemia     Past Surgical History:  Procedure Laterality Date  . Arm injury  1975  . HERNIA REPAIR  1959   Bilateral, inguinal  . TONSILLECTOMY AND ADENOIDECTOMY  1961    Family History  Problem Relation Age of Onset  . Cancer Mother        Lung, (surgically resected)  . Heart disease Mother   . Hearing loss Other        On Mom's side  . Hypertension Neg Hx   . Diabetes Neg Hx   . Colon cancer Neg Hx     Social History   Socioeconomic History  . Marital status: Married    Spouse name: Not on file  . Number of children: 2  . Years of education: Not on file  . Highest education level: Not on file  Occupational History  . Occupation: Retired  Corporate investment banker  . Financial resource strain: Not on file  . Food insecurity:    Worry: Not on file    Inability: Not on file  . Transportation needs:    Medical: Not on file    Non-medical: Not on file  Tobacco Use  . Smoking status: Former Smoker    Types: Cigarettes    Last attempt to quit: 08/30/1980    Years since quitting: 37.9  . Smokeless tobacco: Never Used  Substance and Sexual Activity  . Alcohol use: Yes    Comment: Occasionally  . Drug use: No  . Sexual activity: Not on file  Lifestyle  . Physical activity:    Days per week: Not on file    Minutes per session: Not on file  . Stress: Not on file  Relationships  . Social connections:    Talks on phone: Not on file    Gets together: Not on file    Attends religious service: Not on file    Active member of club or organization: Not on file    Attends meetings of clubs or organizations: Not on file    Relationship status: Not on file  . Intimate partner violence:    Fear of current or ex partner: Not on file    Emotionally abused: Not on file  Physically abused: Not on file    Forced sexual activity: Not on file  Other Topics Concern  . Not on file  Social History Narrative   Has living will   Wife is health care POA-- then son Randall Hiss   Would accept resuscitation   No tube feeds if cognitively unaware   Review of Systems Appetite is good---cutting back on night eating Weight down slightly Sleeps well Wears seat belt Teeth fine----keeps up with dentist/periodontist No regular derm appointments---no suspicious lesions now Bowels are good. No blood No other back or joint pain No heartburn or dysphagia    Objective:   Physical Exam  Constitutional: He is oriented to person, place, and time. He appears well-developed. No distress.  HENT:  Mouth/Throat: Oropharynx is clear and moist. No oropharyngeal exudate.  Neck: No thyromegaly present.  Cardiovascular: Normal rate, regular rhythm,  normal heart sounds and intact distal pulses. Exam reveals no gallop.  No murmur heard. Respiratory: Effort normal and breath sounds normal. No respiratory distress. He has no wheezes. He has no rales.  GI: Soft. There is no tenderness.  Musculoskeletal: He exhibits no edema or tenderness.  Lymphadenopathy:    He has no cervical adenopathy.  Neurological: He is alert and oriented to person, place, and time.  President--- "Dwaine Deter, Bush" [phone] D-l-r-o-w Recall 3/3  Skin: No rash noted. No erythema.  Psychiatric: He has a normal mood and affect. His behavior is normal.           Assessment & Plan:

## 2018-07-19 NOTE — Assessment & Plan Note (Signed)
Mild No Rx needed 

## 2018-07-19 NOTE — Assessment & Plan Note (Signed)
Mild and low risk Will recheck

## 2018-07-19 NOTE — Assessment & Plan Note (Signed)
See social history 

## 2018-10-03 DIAGNOSIS — H18413 Arcus senilis, bilateral: Secondary | ICD-10-CM | POA: Diagnosis not present

## 2018-10-03 DIAGNOSIS — H2511 Age-related nuclear cataract, right eye: Secondary | ICD-10-CM | POA: Diagnosis not present

## 2018-10-03 DIAGNOSIS — H25013 Cortical age-related cataract, bilateral: Secondary | ICD-10-CM | POA: Diagnosis not present

## 2018-10-03 DIAGNOSIS — H25043 Posterior subcapsular polar age-related cataract, bilateral: Secondary | ICD-10-CM | POA: Diagnosis not present

## 2018-10-03 DIAGNOSIS — H2513 Age-related nuclear cataract, bilateral: Secondary | ICD-10-CM | POA: Diagnosis not present

## 2018-10-23 ENCOUNTER — Telehealth: Payer: Self-pay | Admitting: Internal Medicine

## 2018-10-23 NOTE — Telephone Encounter (Signed)
I will refer this to Dr Hilarie Fredrickson since I am not sure if he wanted 3 or 5 year recall. Let him know that he should hear from them.  Philip Mack, Is he do now??

## 2018-10-23 NOTE — Telephone Encounter (Signed)
Pt need referral for his colonoscopy. Please advise

## 2018-10-23 NOTE — Telephone Encounter (Signed)
Left message on VM per DPR. 

## 2018-10-24 NOTE — Telephone Encounter (Signed)
Patient scheduled for colon with Dr Hilarie Fredrickson on 11/30/18 and previsit for 11/03/18. Patient verbalizes understanding of time, date and location of each appointment. States he may need to make some changes in date to colonoscopy depending on his travel schedule but will do so at a later time if needed.

## 2018-10-24 NOTE — Telephone Encounter (Signed)
He is due now We will arrange Thanks for the message Philip Mack Pt needs surveillance colon

## 2018-10-24 NOTE — Telephone Encounter (Signed)
Thanks

## 2018-10-27 ENCOUNTER — Encounter: Payer: Self-pay | Admitting: Internal Medicine

## 2018-11-03 ENCOUNTER — Ambulatory Visit (AMBULATORY_SURGERY_CENTER): Payer: Self-pay | Admitting: *Deleted

## 2018-11-03 VITALS — Ht 76.0 in | Wt 218.0 lb

## 2018-11-03 DIAGNOSIS — Z86010 Personal history of colonic polyps: Secondary | ICD-10-CM

## 2018-11-03 MED ORDER — SUPREP BOWEL PREP KIT 17.5-3.13-1.6 GM/177ML PO SOLN: 1.0000 | Freq: Once | ORAL | 0 refills | Status: AC

## 2018-11-03 NOTE — Progress Notes (Signed)
No egg or soy allergy known to patient  No issues with past sedation with any surgeries  or procedures, no intubation problems  No diet pills per patient No home 02 use per patient  No blood thinners per patient  Pt denies issues with constipation  No A fib or A flutter  EMMI video sent to pt's e mail  

## 2018-11-06 DIAGNOSIS — H2511 Age-related nuclear cataract, right eye: Secondary | ICD-10-CM | POA: Diagnosis not present

## 2018-11-07 DIAGNOSIS — H2512 Age-related nuclear cataract, left eye: Secondary | ICD-10-CM | POA: Diagnosis not present

## 2018-11-30 ENCOUNTER — Encounter: Payer: Federal, State, Local not specified - PPO | Admitting: Internal Medicine

## 2018-12-18 ENCOUNTER — Encounter: Payer: Federal, State, Local not specified - PPO | Admitting: Internal Medicine

## 2019-01-10 DIAGNOSIS — H2513 Age-related nuclear cataract, bilateral: Secondary | ICD-10-CM | POA: Diagnosis not present

## 2019-01-10 DIAGNOSIS — H2512 Age-related nuclear cataract, left eye: Secondary | ICD-10-CM | POA: Diagnosis not present

## 2019-01-24 ENCOUNTER — Telehealth: Payer: Self-pay | Admitting: *Deleted

## 2019-01-24 NOTE — Telephone Encounter (Signed)
Covid-19 travel screening questions  Have you traveled in the last 14 days?no If yes where?  Do you now or have you had a fever in the last 14 days?no  Do you have any respiratory symptoms of shortness of breath or cough now or in the last 14 days?no  Do you have a medical history of Congestive Heart Failure?  Do you have a medical history of lung disease?  Do you have any family members or close contacts with diagnosed or suspected Covid-19?no  PT aware of [facility] policy and will bring a mask with him. SM

## 2019-01-26 ENCOUNTER — Ambulatory Visit (AMBULATORY_SURGERY_CENTER): Payer: Medicare Other | Admitting: Internal Medicine

## 2019-01-26 ENCOUNTER — Other Ambulatory Visit: Payer: Self-pay

## 2019-01-26 ENCOUNTER — Encounter: Payer: Self-pay | Admitting: Internal Medicine

## 2019-01-26 VITALS — BP 115/61 | HR 57 | Temp 98.3°F | Resp 14 | Ht 76.0 in | Wt 218.0 lb

## 2019-01-26 DIAGNOSIS — Z86010 Personal history of colonic polyps: Secondary | ICD-10-CM | POA: Diagnosis not present

## 2019-01-26 MED ORDER — SODIUM CHLORIDE 0.9 % IV SOLN: 500.0000 mL | Freq: Once | INTRAVENOUS | Status: DC

## 2019-01-26 NOTE — Progress Notes (Signed)
[facility] CMA temps and [facility] CMA vitals

## 2019-01-26 NOTE — Progress Notes (Signed)
PT taken to PACU. Monitors in place. VSS. Report given to RN. 

## 2019-01-26 NOTE — Patient Instructions (Signed)
YOU HAD AN ENDOSCOPIC PROCEDURE TODAY AT [facility]:   Refer to the procedure report that was given to you for any specific questions about what was found during the examination.  If the procedure report does not answer your questions, please call your gastroenterologist to clarify.  If you requested that your care partner not be given the details of your procedure findings, then the procedure report has been included in a sealed envelope for you to review at your convenience later.  YOU SHOULD EXPECT: Some feelings of bloating in the abdomen. Passage of more gas than usual.  Walking can help get rid of the air that was put into your GI tract during the procedure and reduce the bloating. If you had a lower endoscopy (such as a colonoscopy or flexible sigmoidoscopy) you may notice spotting of blood in your stool or on the toilet paper. If you underwent a bowel prep for your procedure, you may not have a normal bowel movement for a few days.  Please Note:  You might notice some irritation and congestion in your nose or some drainage.  This is from the oxygen used during your procedure.  There is no need for concern and it should clear up in a day or so.  SYMPTOMS TO REPORT IMMEDIATELY:   Following lower endoscopy (colonoscopy or flexible sigmoidoscopy):  Excessive amounts of blood in the stool  Significant tenderness or worsening of abdominal pains  Swelling of the abdomen that is new, acute  Fever of 100F or higher   For urgent or emergent issues, a gastroenterologist can be reached at any hour by calling [phone].  DIET:  We do recommend a small meal at first, but then you may proceed to your regular diet.  Drink plenty of fluids but you should avoid alcoholic beverages for 24 hours.  ACTIVITY:  You should plan to take it easy for the rest of today and you should NOT DRIVE or use heavy machinery until tomorrow (because of the sedation medicines used during the test).     FOLLOW UP: Our staff will call the number listed on your records 48-72 hours following your procedure to check on you and address any questions or concerns that you may have regarding the information given to you following your procedure. If we do not reach you, we will leave a message.  We will attempt to reach you two times.  During this call, we will ask if you have developed any symptoms of COVID 19. If you develop any symptoms (ie: fever, flu-like symptoms, shortness of breath, cough etc.) before then, please call [phone].  If you test positive for Covid 19 in the 2 weeks post procedure, please call and report this information to [place].    SIGNATURES/CONFIDENTIALITY: You and/or your care partner have signed paperwork which will be entered into your electronic medical record.  These signatures attest to the fact that that the information above on your After Visit Summary has been reviewed and is understood.  Full responsibility of the confidentiality of this discharge information lies with you and/or your care-partner.  Continue your normal medications  Next colonoscopy- 5 years  Please read over handouts about diverticulosis and hemorrhoids

## 2019-01-26 NOTE — Op Note (Signed)
[facility] Patient Name: Philip Mack Procedure Date: 01/26/2019 7:44 AM MRN: [id number] Endoscopist: Jerene Bears , MD Age: 68 Referring MD:  Date of Birth: Feb 03, 1951 Gender: Male Account #: [id number] Procedure:                Colonoscopy Indications:              High risk colon cancer surveillance: Personal                            history of non-advanced adenoma, High risk colon                            cancer surveillance: Personal history of sessile                            serrated colon polyp (less than 10 mm in size) with                            no dysplasia, Last colonoscopy: January 2017 Medicines:                Monitored Anesthesia Care Procedure:                Pre-Anesthesia Assessment:                           - Prior to the procedure, a History and Physical                            was performed, and patient medications and                            allergies were reviewed. The patient's tolerance of                            previous anesthesia was also reviewed. The risks                            and benefits of the procedure and the sedation                            options and risks were discussed with the patient.                            All questions were answered, and informed consent                            was obtained. Prior Anticoagulants: The patient has                            taken no previous anticoagulant or antiplatelet                            agents. ASA Grade Assessment: II - A patient with  mild systemic disease. After reviewing the risks                            and benefits, the patient was deemed in                            satisfactory condition to undergo the procedure.                           After obtaining informed consent, the colonoscope                            was passed under direct vision. Throughout the                            procedure, the  patient's blood pressure, pulse, and                            oxygen saturations were monitored continuously. The                            Colonoscope was introduced through the anus and                            advanced to the cecum, identified by appendiceal                            orifice and ileocecal valve. The colonoscopy was                            performed without difficulty. The patient tolerated                            the procedure well. The quality of the bowel                            preparation was good. The ileocecal valve,                            appendiceal orifice, and rectum were photographed. Scope In: 8:44:33 AM Scope Out: 8:57:13 AM Scope Withdrawal Time: 0 hours 10 minutes 31 seconds  Total Procedure Duration: 0 hours 12 minutes 40 seconds  Findings:                 The digital rectal exam was normal.                           Multiple small and large-mouthed diverticula were                            found in the sigmoid colon and descending colon.                           Internal hemorrhoids were found during  retroflexion. The hemorrhoids were small.                           The exam was otherwise without abnormality. Complications:            No immediate complications. Estimated Blood Loss:     Estimated blood loss: none. Impression:               - Diverticulosis in the sigmoid colon and in the                            descending colon.                           - Internal hemorrhoids.                           - The examination was otherwise normal.                           - No specimens collected. Recommendation:           - Patient has a contact number available for                            emergencies. The signs and symptoms of potential                            delayed complications were discussed with the                            patient. Return to normal activities tomorrow.                             Written discharge instructions were provided to the                            patient.                           - Resume previous diet.                           - Continue present medications.                           - Repeat colonoscopy in 5 years for surveillance. Jerene Bears, MD 01/26/2019 9:02:52 AM This report has been signed electronically.

## 2019-01-30 ENCOUNTER — Telehealth: Payer: Self-pay

## 2019-01-30 NOTE — Telephone Encounter (Signed)
1. Have you developed a fever since your procedure? no  2.   Have you had an respiratory symptoms (SOB or cough) since your procedure? no  3.   Have you tested positive for COVID 19 since your procedure no  4.   Have you had any family members/close contacts diagnosed with the COVID 19 since your procedure?  no   If yes to any of these questions please route to Joylene John, RN and Alphonsa Gin, Therapist, sports.  Follow up Call-  Call back number 01/26/2019  Post procedure Call Back phone  # [phone]  Permission to leave phone message Yes  Some recent data might be hidden     Patient questions:  Do you have a fever, pain , or abdominal swelling? No. Pain Score  0 *  Have you tolerated food without any problems? Yes.    Have you been able to return to your normal activities? Yes.    Do you have any questions about your discharge instructions: Diet   No. Medications  No. Follow up visit  No.  Do you have questions or concerns about your Care? No.  Actions: * If pain score is 4 or above: No action needed, pain <4.  No problems noted per pt.

## 2019-02-14 DIAGNOSIS — H40003 Preglaucoma, unspecified, bilateral: Secondary | ICD-10-CM | POA: Diagnosis not present

## 2019-03-08 ENCOUNTER — Telehealth: Payer: Self-pay

## 2019-03-08 NOTE — Telephone Encounter (Signed)
Pt said that he and his wife are planning to visit with their son in the near future at a house in [place]. Pt wants to know if he should be tested prior to travel. Pt has no covid symptoms, no travel and no known exposure to + covid. Pt said he does not want a request for covid testing sent to Dr Silvio Pate at this time until pt and his wife discuss how they are going to proceed. Pt is considering going to a drive thru covid testing that does not require an order for covid test. Pt wanted to know how long it takes to get the covid results if we set up the testing; advised 5 - 7 days and could be slightly longer depending on test volume. Pt voiced understanding and will talk with wife and cb if needed.

## 2019-04-26 ENCOUNTER — Ambulatory Visit (INDEPENDENT_AMBULATORY_CARE_PROVIDER_SITE_OTHER): Payer: Medicare Other

## 2019-04-26 DIAGNOSIS — Z23 Encounter for immunization: Secondary | ICD-10-CM | POA: Diagnosis not present

## 2019-07-24 ENCOUNTER — Encounter: Payer: Federal, State, Local not specified - PPO | Admitting: Internal Medicine

## 2019-07-25 ENCOUNTER — Other Ambulatory Visit: Payer: Self-pay

## 2019-07-31 ENCOUNTER — Encounter: Payer: Self-pay | Admitting: Internal Medicine

## 2019-07-31 ENCOUNTER — Ambulatory Visit (INDEPENDENT_AMBULATORY_CARE_PROVIDER_SITE_OTHER): Payer: Medicare Other | Admitting: Internal Medicine

## 2019-07-31 ENCOUNTER — Other Ambulatory Visit: Payer: Self-pay

## 2019-07-31 VITALS — BP 124/80 | HR 54 | Temp 97.6°F | Ht 75.0 in | Wt 217.0 lb

## 2019-07-31 DIAGNOSIS — R200 Anesthesia of skin: Secondary | ICD-10-CM | POA: Diagnosis not present

## 2019-07-31 DIAGNOSIS — Z7189 Other specified counseling: Secondary | ICD-10-CM | POA: Diagnosis not present

## 2019-07-31 DIAGNOSIS — Z125 Encounter for screening for malignant neoplasm of prostate: Secondary | ICD-10-CM

## 2019-07-31 DIAGNOSIS — N401 Enlarged prostate with lower urinary tract symptoms: Secondary | ICD-10-CM | POA: Diagnosis not present

## 2019-07-31 DIAGNOSIS — G8929 Other chronic pain: Secondary | ICD-10-CM

## 2019-07-31 DIAGNOSIS — E785 Hyperlipidemia, unspecified: Secondary | ICD-10-CM

## 2019-07-31 DIAGNOSIS — N486 Induration penis plastica: Secondary | ICD-10-CM

## 2019-07-31 DIAGNOSIS — Z0000 Encounter for general adult medical examination without abnormal findings: Secondary | ICD-10-CM

## 2019-07-31 DIAGNOSIS — M25511 Pain in right shoulder: Secondary | ICD-10-CM | POA: Diagnosis not present

## 2019-07-31 DIAGNOSIS — N138 Other obstructive and reflux uropathy: Secondary | ICD-10-CM | POA: Diagnosis not present

## 2019-07-31 LAB — COMPREHENSIVE METABOLIC PANEL
ALT: 23 U/L (ref 0–53)
AST: 27 U/L (ref 0–37)
Albumin: 4.4 g/dL (ref 3.5–5.2)
Alkaline Phosphatase: 48 U/L (ref 39–117)
BUN: 14 mg/dL (ref 6–23)
CO2: 29 mEq/L (ref 19–32)
Calcium: 9.6 mg/dL (ref 8.4–10.5)
Chloride: 105 mEq/L (ref 96–112)
Creatinine, Ser: 0.89 mg/dL (ref 0.40–1.50)
GFR: 84.98 mL/min (ref >60.00)
Glucose, Bld: 95 mg/dL (ref 70–99)
Potassium: 4.8 mEq/L (ref 3.5–5.1)
Sodium: 139 mEq/L (ref 135–145)
Total Bilirubin: 0.9 mg/dL (ref 0.2–1.2)
Total Protein: 6.7 g/dL (ref 6.0–8.3)

## 2019-07-31 LAB — CBC
HCT: 44.5 % (ref 39.0–52.0)
Hemoglobin: 15 g/dL (ref 13.0–17.0)
MCHC: 33.7 g/dL (ref 30.0–36.0)
MCV: 91 fl (ref 78.0–100.0)
Platelets: 179 10*3/uL (ref 150.0–400.0)
RBC: 4.89 Mil/uL (ref 4.22–5.81)
RDW: 13.7 % (ref 11.5–15.5)
WBC: 5.2 10*3/uL (ref 4.0–10.5)

## 2019-07-31 LAB — PSA, MEDICARE: PSA: 0.31 ng/ml (ref 0.10–4.00)

## 2019-07-31 LAB — LIPID PANEL
Cholesterol: 233 mg/dL — ABNORMAL HIGH (ref 0–200)
HDL: 62.5 mg/dL (ref >39.00)
LDL Cholesterol: 157 mg/dL — ABNORMAL HIGH (ref 0–99)
NonHDL: 170.5
Total CHOL/HDL Ratio: 4
Triglycerides: 70 mg/dL (ref 0.0–149.0)
VLDL: 14 mg/dL (ref 0.0–40.0)

## 2019-07-31 NOTE — Assessment & Plan Note (Signed)
See social history 

## 2019-07-31 NOTE — Progress Notes (Signed)
Subjective:    Patient ID: Philip Mack, male    DOB: 1951/05/13, 68 y.o.   MRN: [id number]  HPI Here for Medicare wellness visit and follow up of chronic health conditions  This visit occurred during the SARS-CoV-2 public health emergency.  Safety protocols were in place, including screening questions prior to the visit, additional usage of staff PPE, and extensive cleaning of exam room while observing appropriate contact time as indicated for disinfecting solutions.   Reviewed form and advanced directives Reviewed other doctors Will have 2 beers a week or so No tobacco Vision is good---both cataracts removed Hearing is okay--may have some mild issues No falls No depression or anhedonia Independent with instrumental ADLs No sig memory issues Exercises regularly  Only new concern is pain in right neck Thinks it may be "pinched" Tingling and numbness along jaw and right cheek Seems to radiate from occiput---he can massage and it may improve Recurs daily---does go away in a few minutes No apparent inciting events --not exercise related  Reviewed mildly elevated cholesterol No other significant risk factors (negative FH, etc)  Hasn't gone back to the urologist 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 meloxicam (MOBIC) 15 MG tablet Take 1 tablet daily by mouth.  1   No current facility-administered medications on file prior to visit.     No Known Allergies  Past Medical History:  Diagnosis Date  . Cataract   . Hepatitis   . Hyperlipidemia     Past Surgical History:  Procedure Laterality Date  . Arm injury  1975  . COLONOSCOPY    . HERNIA REPAIR  1959   Bilateral, inguinal  . POLYPECTOMY    . TONSILLECTOMY AND ADENOIDECTOMY  1961    Family History  Problem Relation Age of Onset  . Cancer Mother        Lung, (surgically resected)  . Heart disease Mother   . Hearing loss Other        On Mom's side  . Hypertension Neg Hx   . Diabetes Neg Hx   . Colon cancer Neg Hx   . Colon polyps Neg Hx   . Stomach cancer Neg Hx   . Rectal cancer Neg Hx     Social History   Socioeconomic History  . Marital status: Married    Spouse name: Not on file  . Number of children: 2  . Years of education: Not on file  . Highest education level: Not on file  Occupational History  . Occupation: Retired Corporate investment banker  . Financial resource strain: Not on file  . Food insecurity    Worry: Not on file    Inability: Not on file  . Transportation needs    Medical: Not on file    Non-medical: Not on file  Tobacco Use  . Smoking status: Former Smoker    Types: Cigarettes    Quit date: 08/30/1980    Years since quitting: 38.9  . Smokeless tobacco: Never Used  Substance and Sexual Activity  . Alcohol use: Yes    Comment: Occasionally  . Drug use: No  . Sexual activity: Not on file  Lifestyle  . Physical activity  Days per week: Not on file    Minutes per session: Not on file  . Stress: Not on file  Relationships  . Social Herbalist on phone: Not on file    Gets together: Not on file    Attends religious service: Not on file    Active member of club or organization: Not on file    Attends meetings of clubs or organizations: Not on file    Relationship status: Not on file  . Intimate partner violence    Fear of current or ex partner: Not on file    Emotionally abused: Not on file    Physically abused: Not on file    Forced sexual activity: Not on file  Other Topics Concern  . Not on file  Social History Narrative   Has living will   Wife is health care POA-- then son Randall Hiss   Would accept resuscitation   No  tube feeds if cognitively unaware   Review of Systems Appetite is good Weight stable Sleeps fine Wears seat belt Teeth are good--regular with dentist/periodontist No rash or suspicious skin lesion No chest pain or SOB No dizziness or syncope No edema No palpitations  Bowels move well. No blood    Objective:   Physical Exam  Constitutional: He is oriented to person, place, and time. He appears well-developed. No distress.  HENT:  Mouth/Throat: Oropharynx is clear and moist. No oropharyngeal exudate.  Neck: Normal range of motion. No thyromegaly present.  Cardiovascular: Normal rate, regular rhythm, normal heart sounds and intact distal pulses. Exam reveals no gallop.  No murmur heard. Respiratory: Effort normal and breath sounds normal. No respiratory distress. He has no wheezes. He has no rales.  GI: Soft. There is no abdominal tenderness.  Musculoskeletal:        General: No tenderness or edema.  Lymphadenopathy:    He has no cervical adenopathy.  Neurological: He is alert and oriented to person, place, and time.  President--- "Daisy Floro, Barack Abbe [place] [employer]" [phone] [id number] Recall 3/3  Normal sensation in face  Skin: No rash noted. No erythema.  Psychiatric: He has a normal mood and affect. His behavior is normal.           Assessment & Plan:

## 2019-07-31 NOTE — Assessment & Plan Note (Signed)
Very mild symptoms No action for now

## 2019-07-31 NOTE — Assessment & Plan Note (Signed)
Does okay with intermittent meloxicam

## 2019-07-31 NOTE — Progress Notes (Signed)
Hearing Screening   Method: Audiometry   125Hz  250Hz  500Hz  1000Hz  2000Hz  3000Hz  4000Hz  6000Hz  8000Hz   Right ear:   20 20 20  20     Left ear:   20 20 20   0    Vision Screening Comments: June 2020

## 2019-07-31 NOTE — Assessment & Plan Note (Signed)
Doesn't seem like trigeminal based He will monitor

## 2019-07-31 NOTE — Assessment & Plan Note (Signed)
Mild Otherwise low risk and HDL pretty good No statin for now

## 2019-07-31 NOTE — Assessment & Plan Note (Signed)
I have personally reviewed the Medicare Annual Wellness questionnaire and have noted 1. The patient's medical and social history 2. Their use of alcohol, tobacco or illicit drugs 3. Their current medications and supplements 4. The patient's functional ability including ADL's, fall risks, home safety risks and hearing or visual             impairment. 5. Diet and physical activities 6. Evidence for depression or mood disorders  The patients weight, height, BMI and visual acuity have been recorded in the chart I have made referrals, counseling and provided education to the patient based review of the above and I have provided the pt with a written personalized care plan for preventive services.  I have provided you with a copy of your personalized plan for preventive services. Please take the time to review along with your updated medication list.  Will check PSA after discussion Colon due in 5 years Exercises regularly He will look into shingrix Had flu vaccine

## 2019-07-31 NOTE — Assessment & Plan Note (Signed)
No Rx Not really an issue for him at this point

## 2019-08-13 ENCOUNTER — Other Ambulatory Visit: Payer: Self-pay

## 2019-08-13 DIAGNOSIS — Z20822 Contact with and (suspected) exposure to covid-19: Secondary | ICD-10-CM

## 2019-08-13 DIAGNOSIS — Z20828 Contact with and (suspected) exposure to other viral communicable diseases: Secondary | ICD-10-CM | POA: Diagnosis not present

## 2019-08-14 LAB — NOVEL CORONAVIRUS, NAA: SARS-CoV-2, NAA: NOT DETECTED

## 2019-10-12 ENCOUNTER — Other Ambulatory Visit: Payer: Self-pay

## 2019-10-12 ENCOUNTER — Encounter: Payer: Self-pay | Admitting: Emergency Medicine

## 2019-10-12 ENCOUNTER — Inpatient Hospital Stay
Admission: EM | Admit: 2019-10-12 | Discharge: 2019-10-16 | DRG: 247 | Disposition: A | Discharge status: Home / Self Care | Payer: Medicare Other | Attending: Internal Medicine | Admitting: Internal Medicine

## 2019-10-12 ENCOUNTER — Emergency Department: Payer: Medicare Other

## 2019-10-12 DIAGNOSIS — N401 Enlarged prostate with lower urinary tract symptoms: Secondary | ICD-10-CM | POA: Diagnosis present

## 2019-10-12 DIAGNOSIS — R739 Hyperglycemia, unspecified: Secondary | ICD-10-CM | POA: Diagnosis present

## 2019-10-12 DIAGNOSIS — R7989 Other specified abnormal findings of blood chemistry: Secondary | ICD-10-CM

## 2019-10-12 DIAGNOSIS — Z79899 Other long term (current) drug therapy: Secondary | ICD-10-CM

## 2019-10-12 DIAGNOSIS — R778 Other specified abnormalities of plasma proteins: Secondary | ICD-10-CM

## 2019-10-12 DIAGNOSIS — N486 Induration penis plastica: Secondary | ICD-10-CM | POA: Diagnosis present

## 2019-10-12 DIAGNOSIS — Z86010 Personal history of colonic polyps: Secondary | ICD-10-CM

## 2019-10-12 DIAGNOSIS — E785 Hyperlipidemia, unspecified: Secondary | ICD-10-CM | POA: Diagnosis present

## 2019-10-12 DIAGNOSIS — Z8619 Personal history of other infectious and parasitic diseases: Secondary | ICD-10-CM

## 2019-10-12 DIAGNOSIS — Z87891 Personal history of nicotine dependence: Secondary | ICD-10-CM | POA: Diagnosis not present

## 2019-10-12 DIAGNOSIS — R079 Chest pain, unspecified: Secondary | ICD-10-CM

## 2019-10-12 DIAGNOSIS — D696 Thrombocytopenia, unspecified: Secondary | ICD-10-CM | POA: Diagnosis not present

## 2019-10-12 DIAGNOSIS — I214 Non-ST elevation (NSTEMI) myocardial infarction: Secondary | ICD-10-CM | POA: Diagnosis present

## 2019-10-12 DIAGNOSIS — N138 Other obstructive and reflux uropathy: Secondary | ICD-10-CM | POA: Diagnosis present

## 2019-10-12 DIAGNOSIS — Z20822 Contact with and (suspected) exposure to covid-19: Secondary | ICD-10-CM | POA: Diagnosis present

## 2019-10-12 DIAGNOSIS — E782 Mixed hyperlipidemia: Secondary | ICD-10-CM | POA: Diagnosis present

## 2019-10-12 DIAGNOSIS — I10 Essential (primary) hypertension: Secondary | ICD-10-CM | POA: Diagnosis present

## 2019-10-12 LAB — BASIC METABOLIC PANEL
Anion gap: 11 (ref 5–15)
BUN: 18 mg/dL (ref 8–23)
CO2: 26 mmol/L (ref 22–32)
Calcium: 9.5 mg/dL (ref 8.9–10.3)
Chloride: 102 mmol/L (ref 98–111)
Creatinine, Ser: 1.03 mg/dL (ref 0.61–1.24)
GFR calc Af Amer: >60 mL/min (ref >60)
GFR calc non Af Amer: >60 mL/min (ref >60)
Glucose, Bld: 132 mg/dL — ABNORMAL HIGH (ref 70–99)
Potassium: 4.1 mmol/L (ref 3.5–5.1)
Sodium: 139 mmol/L (ref 135–145)

## 2019-10-12 LAB — CBC
HCT: 45.7 % (ref 39.0–52.0)
Hemoglobin: 15.1 g/dL (ref 13.0–17.0)
MCH: 30 pg (ref 26.0–34.0)
MCHC: 33 g/dL (ref 30.0–36.0)
MCV: 90.7 fL (ref 80.0–100.0)
Platelets: 190 10*3/uL (ref 150–400)
RBC: 5.04 MIL/uL (ref 4.22–5.81)
RDW: 12.9 % (ref 11.5–15.5)
WBC: 7.4 10*3/uL (ref 4.0–10.5)
nRBC: 0 % (ref 0.0–0.2)

## 2019-10-12 LAB — RESPIRATORY PANEL BY RT PCR (FLU A&B, COVID)
Influenza A by PCR: NEGATIVE
Influenza B by PCR: NEGATIVE
SARS Coronavirus 2 by RT PCR: NEGATIVE

## 2019-10-12 LAB — LIPID PANEL
Cholesterol: 231 mg/dL — ABNORMAL HIGH (ref 0–200)
HDL: 67 mg/dL (ref >40)
LDL Cholesterol: 156 mg/dL — ABNORMAL HIGH (ref 0–99)
Total CHOL/HDL Ratio: 3.4 RATIO
Triglycerides: 41 mg/dL (ref <150)
VLDL: 8 mg/dL (ref 0–40)

## 2019-10-12 LAB — APTT: aPTT: 31 seconds (ref 24–36)

## 2019-10-12 LAB — PROTIME-INR
INR: 0.9 (ref 0.8–1.2)
Prothrombin Time: 11.9 seconds (ref 11.4–15.2)

## 2019-10-12 LAB — TROPONIN I (HIGH SENSITIVITY)
Troponin I (High Sensitivity): 1204 ng/L (ref <18)
Troponin I (High Sensitivity): 1585 ng/L (ref <18)
Troponin I (High Sensitivity): 3239 ng/L (ref <18)

## 2019-10-12 MED ORDER — SODIUM CHLORIDE 0.9 % IV SOLN: INTRAVENOUS | Status: DC

## 2019-10-12 MED ORDER — ASPIRIN 81 MG PO CHEW
324.0000 mg | CHEWABLE_TABLET | Freq: Once | ORAL | Status: AC
  Administered 2019-10-12: 324 mg via ORAL
  Filled 2019-10-12: qty 4

## 2019-10-12 MED ORDER — METOPROLOL TARTRATE 25 MG PO TABS: 25.0000 mg | ORAL_TABLET | Freq: Two times a day (BID) | ORAL | Status: DC

## 2019-10-12 MED ORDER — ONDANSETRON HCL 4 MG/2ML IJ SOLN: 4.0000 mg | Freq: Four times a day (QID) | INTRAMUSCULAR | Status: DC | PRN

## 2019-10-12 MED ORDER — HEPARIN BOLUS VIA INFUSION
4000.0000 [IU] | Freq: Once | INTRAVENOUS | Status: AC
  Administered 2019-10-12: 4000 [IU] via INTRAVENOUS
  Filled 2019-10-12: qty 4000

## 2019-10-12 MED ORDER — MELOXICAM 7.5 MG PO TABS: 15.0000 mg | ORAL_TABLET | Freq: Every day | ORAL | Status: DC | PRN

## 2019-10-12 MED ORDER — MORPHINE SULFATE (PF) 2 MG/ML IV SOLN: 2.0000 mg | INTRAVENOUS | Status: DC | PRN

## 2019-10-12 MED ORDER — HEPARIN (PORCINE) 25000 UT/250ML-% IV SOLN
1300.0000 [IU]/h | INTRAVENOUS | Status: DC
  Administered 2019-10-12 – 2019-10-15 (×2): 1300 [IU]/h via INTRAVENOUS
  Filled 2019-10-12 (×4): qty 250

## 2019-10-12 MED ORDER — ACETAMINOPHEN 325 MG PO TABS: 650.0000 mg | ORAL_TABLET | ORAL | Status: DC | PRN

## 2019-10-12 MED ORDER — NITROGLYCERIN 0.4 MG SL SUBL: 0.4000 mg | SUBLINGUAL_TABLET | SUBLINGUAL | Status: DC | PRN

## 2019-10-12 NOTE — Consult Note (Signed)
[facility] for Heparin Indication: chest pain/ACS  No Known Allergies  Patient Measurements: Height: 6\' 4"  (193 cm) Weight: 210 lb (95.3 kg) IBW/kg (Calculated) : 86.8 Heparin Dosing Weight: 95.3 kg  Vital Signs: Temp: 97.7 F (36.5 C) (02/12 1851) Temp Source: Oral (02/12 1851) BP: 157/102 (02/12 1849) Pulse Rate: 67 (02/12 1849)  Labs: Recent Labs    10/12/19 1803  HGB 15.1  HCT 45.7  PLT 190  CREATININE 1.03  TROPONINIHS 1,204*    Estimated Creatinine Clearance: 84.3 mL/min (by C-G formula based on SCr of 1.03 mg/dL).   Medical History: Past Medical History:  Diagnosis Date  . Cataract   . Hepatitis   . Hyperlipidemia     Medications:  (Not in a hospital admission)  Scheduled:  . aspirin  324 mg Oral Once   Infusions:   PRN:  Anti-infectives (From admission, onward)   None      Assessment: Pharmacy consulted to start heparin for ACS. No DOAC noted PTA.  Goal of Therapy:  Heparin level 0.3-0.7 units/ml Monitor platelets by anticoagulation protocol: Yes   Plan:  Give 4000 units bolus x 1 Start heparin infusion at 1300 units/hr Check anti-Xa level in 6 hours and daily while on heparin Continue to monitor H&H and platelets  Oswald Hillock, PharmD, BCPS 10/12/2019,7:05 PM

## 2019-10-12 NOTE — ED Notes (Signed)
Report to Sherry, RN.

## 2019-10-12 NOTE — ED Notes (Signed)
Attempted to call heads up as bed has been ready for 30 minutes without call-back. Call transferred three times and put on hold by [employer]. Heads up provided to unit secretary and charge. Transportation requested.

## 2019-10-12 NOTE — H&P (Signed)
History and Physical   Philip Mack [id number] DOB: [birth year] DOA: 10/12/2019  Referring MD/NP/PA: Dr Archie Balboa  PCP: Venia Carbon, MD   Outpatient Specialists: None  Patient coming from: Home  Chief Complaint: Chest pain  HPI: Philip Mack is a 69 y.o. male with medical history significant of hyperlipidemia, gastric and hepatitis C with no prior cardiac history who presented with substernal chest pain that started today. Pain was rated as 6 out of 10. Was heavy on his chest. Worsened with activities not relieved by anything. It radiates to his left shoulder and upper arm. Patient has noted recurrent chest pain whenever he was doing his yard work. This is however the most intense. His chest pain is now resolved with treatment in the ER including nitroglycerin and morphine. He is otherwise stable. Denied any strong family history of coronary artery disease. While in the ER EKG was noted to be normal however he has markedly elevated troponins. At this point non-ST elevation MI suspected. Patient being admitted to the hospital for evaluation and treatment..  ED Course: CBC and chemistry literally within normal. Glucose is 132. Troponin initially 1204, second 1585 and third 3039. Lipid panel showed an LDL of 156 and HDL of 67. Chest x-ray showed no acute findings. EKG showed normal sinus rhythm with mild ST depression in the lateral leads.  Review of Systems: As per HPI otherwise 10 point review of systems negative.    Past Medical History:  Diagnosis Date  . Cataract   . Hepatitis   . Hyperlipidemia     Past Surgical History:  Procedure Laterality Date  . Arm injury  1975  . COLONOSCOPY    . HERNIA REPAIR  1959   Bilateral, inguinal  . POLYPECTOMY    . TONSILLECTOMY AND ADENOIDECTOMY  1961     reports that he quit smoking about 39 years ago. His smoking use included cigarettes. He has never used smokeless tobacco. He reports current alcohol use. He reports that he  does not use drugs.  No Known Allergies  Family History  Problem Relation Age of Onset  . Cancer Mother        Lung, (surgically resected)  . Heart disease Mother   . Hearing loss Other        On Mom's side  . Hypertension Neg Hx   . Diabetes Neg Hx   . Colon cancer Neg Hx   . Colon polyps Neg Hx   . Stomach cancer Neg Hx   . Rectal cancer Neg Hx      Prior to Admission medications   Medication Sig Start Date End Date Taking? Authorizing Provider  meloxicam (MOBIC) 15 MG tablet Take 1 tablet by mouth daily as needed for pain.  06/03/17  Yes [provider]    Physical Exam: Vitals:   10/12/19 2000 10/12/19 2100 10/12/19 2106 10/12/19 2155  BP: 133/78 (!) 158/94  129/74  Pulse: (!) 51 62  (!) 56  Resp: 16 19    Temp:   97.8 F (36.6 C) 97.7 F (36.5 C)  TempSrc:   Oral Oral  SpO2: 96% 98%  99%  Weight:    100.1 kg  Height:    6\' 4"  (1.93 m)      Constitutional: Anxious but no acute distress Vitals:   10/12/19 2000 10/12/19 2100 10/12/19 2106 10/12/19 2155  BP: 133/78 (!) 158/94  129/74  Pulse: (!) 51 62  (!) 56  Resp: 16 19    Temp:  97.8 F (36.6 C) 97.7 F (36.5 C)  TempSrc:   Oral Oral  SpO2: 96% 98%  99%  Weight:    100.1 kg  Height:    6\' 4"  (1.93 m)   Eyes: PERRL, lids and conjunctivae normal ENMT: Mucous membranes are moist. Posterior pharynx clear of any exudate or lesions.Normal dentition.  Neck: normal, supple, no masses, no thyromegaly Respiratory: clear to auscultation bilaterally, no wheezing, no crackles. Normal respiratory effort. No accessory muscle use.  Cardiovascular: Sinus bradycardia no murmurs / rubs / gallops. No extremity edema. 2+ pedal pulses. No carotid bruits.  Abdomen: no tenderness, no masses palpated. No hepatosplenomegaly. Bowel sounds positive.  Musculoskeletal: no clubbing / cyanosis. No joint deformity upper and lower extremities. Good ROM, no contractures. Normal muscle tone.  Skin: no rashes, lesions, ulcers.  No induration Neurologic: CN 2-12 grossly intact. Sensation intact, DTR normal. Strength 5/5 in all 4.  Psychiatric: Normal judgment and insight. Alert and oriented x 3. Normal mood.     Labs on Admission: I have personally reviewed following labs and imaging studies  CBC: Recent Labs  Lab 10/12/19 1803  WBC 7.4  HGB 15.1  HCT 45.7  MCV 90.7  PLT 99991111   Basic Metabolic Panel: Recent Labs  Lab 10/12/19 1803  NA 139  K 4.1  CL 102  CO2 26  GLUCOSE 132*  BUN 18  CREATININE 1.03  CALCIUM 9.5   GFR: Estimated Creatinine Clearance: 84.3 mL/min (by C-G formula based on SCr of 1.03 mg/dL). Liver Function Tests: No results for input(s): AST, ALT, ALKPHOS, BILITOT, PROT, ALBUMIN in the last 168 hours. No results for input(s): LIPASE, AMYLASE in the last 168 hours. No results for input(s): AMMONIA in the last 168 hours. Coagulation Profile: Recent Labs  Lab 10/12/19 1803  INR 0.9   Cardiac Enzymes: No results for input(s): CKTOTAL, CKMB, CKMBINDEX, TROPONINI in the last 168 hours. BNP (last 3 results) No results for input(s): PROBNP in the last 8760 hours. HbA1C: No results for input(s): HGBA1C in the last 72 hours. CBG: No results for input(s): GLUCAP in the last 168 hours. Lipid Profile: No results for input(s): CHOL, HDL, LDLCALC, TRIG, CHOLHDL, LDLDIRECT in the last 72 hours. Thyroid Function Tests: No results for input(s): TSH, T4TOTAL, FREET4, T3FREE, THYROIDAB in the last 72 hours. Anemia Panel: No results for input(s): VITAMINB12, FOLATE, FERRITIN, TIBC, IRON, RETICCTPCT in the last 72 hours. Urine analysis:    Component Value Date/Time   COLORURINE straw 09/02/2008 1127   APPEARANCEUR Hazy 09/02/2008 1127   LABSPEC 1.020 09/02/2008 1127   PHURINE 5.0 09/02/2008 1127   HGBUR negative 09/02/2008 1127   BILIRUBINUR negative 09/02/2008 1127   UROBILINOGEN 0.2 09/02/2008 1127   NITRITE negative 09/02/2008 1127   Sepsis  Labs: @LABRCNTIP (procalcitonin:4,lacticidven:4) ) Recent Results (from the past 240 hour(s))  Respiratory Panel by RT PCR (Flu A&B, Covid) - Nasopharyngeal Swab     Status: None   Collection Time: 10/12/19  7:05 PM   Specimen: Nasopharyngeal Swab  Result Value Ref Range Status   SARS Coronavirus 2 by RT PCR NEGATIVE NEGATIVE Final    Comment: (NOTE) SARS-CoV-2 target nucleic acids are NOT DETECTED. The SARS-CoV-2 RNA is generally detectable in upper respiratoy specimens during the acute phase of infection. The lowest concentration of SARS-CoV-2 viral copies this assay can detect is 131 copies/mL. A negative result does not preclude SARS-Cov-2 infection and should not be used as the sole basis for treatment or other patient management decisions. A negative  result may occur with  improper specimen collection/handling, submission of specimen other than nasopharyngeal swab, presence of viral mutation(s) within the areas targeted by this assay, and inadequate number of viral copies (<131 copies/mL). A negative result must be combined with clinical observations, patient history, and epidemiological information. The expected result is Negative. Fact Sheet for Patients:  PinkCheek.be Fact Sheet for Healthcare Providers:  GravelBags.it This test is not yet ap proved or cleared by the [place] FDA and  has been authorized for detection and/or diagnosis of SARS-CoV-2 by FDA under an Emergency Use Authorization (EUA). This EUA will remain  in effect (meaning this test can be used) for the duration of the COVID-19 declaration under Section 564(b)(1) of the Act, 21 U.S.C. section 360bbb-3(b)(1), unless the authorization is terminated or revoked sooner.    Influenza A by PCR NEGATIVE NEGATIVE Final   Influenza B by PCR NEGATIVE NEGATIVE Final    Comment: (NOTE) The Xpert Xpress SARS-CoV-2/FLU/RSV assay is intended as an aid in  the  diagnosis of influenza from Nasopharyngeal swab specimens and  should not be used as a sole basis for treatment. Nasal washings and  aspirates are unacceptable for Xpert Xpress SARS-CoV-2/FLU/RSV  testing. Fact Sheet for Patients: PinkCheek.be Fact Sheet for Healthcare Providers: GravelBags.it This test is not yet approved or cleared by the [place] FDA and  has been authorized for detection and/or diagnosis of SARS-CoV-2 by  FDA under an Emergency Use Authorization (EUA). This EUA will remain  in effect (meaning this test can be used) for the duration of the  Covid-19 declaration under Section 564(b)(1) of the Act, 21  U.S.C. section 360bbb-3(b)(1), unless the authorization is  terminated or revoked. Performed at [facility], [address], [place], [place] [address]      Radiological Exams on Admission: DG Chest 2 View  Result Date: 10/12/2019 CLINICAL DATA:  Chest pain EXAM: CHEST - 2 VIEW COMPARISON:  None. FINDINGS: Heart is normal size. Lungs are clear. No effusion or pneumothorax. Concern for left lateral rib fracture involving the left 6th rib. Recommend correlation for pain in this area. This is age indeterminate. IMPRESSION: No acute cardiopulmonary disease. Concern for left lateral 6th rib fracture, age indeterminate. Recommend correlation for pain in this area. Electronically Signed   By: Rolm Baptise M.D.   On: 10/12/2019 18:22    EKG: Independently reviewed. Shows sinus rhythm with a rate of 60, minimal ST depression in the lateral leads, no evidence of conduction abnormalities.  Assessment/Plan Principal Problem:   NSTEMI (non-ST elevated myocardial infarction) [facility]) Active Problems:   Hyperlipidemia   BPH with obstruction/lower urinary tract symptoms     #1 non-ST elevation MI: Patient has ruled in for MI. He is currently chest pain-free. Initiate heparin drip. Nitroglycerin as needed,  beta-blockers, aspirin and consider ACE inhibitors. Also morphine for pain. Cardiology consulted and will follow recommendations.  #2 hyperlipidemia: Continue with statin. This may be a big risk factor for patient's coronary artery disease symptoms.  #3 BPH: Stable. Continue home regimen.   DVT prophylaxis: Heparin drip Code Status: Full code Family Communication: No family at bedside Disposition Plan: Home Consults called: Cardiology Dr. Nehemiah Massed Admission status: Inpatient  Severity of Illness: The appropriate patient status for this patient is INPATIENT. Inpatient status is judged to be reasonable and necessary in order to provide the required intensity of service to ensure the patient's safety. The patient's presenting symptoms, physical exam findings, and initial radiographic and laboratory data in the context  of their chronic comorbidities is felt to place them at high risk for further clinical deterioration. Furthermore, it is not anticipated that the patient will be medically stable for discharge from the hospital within 2 midnights of admission. The following factors support the patient status of inpatient.   " The patient's presenting symptoms include chest pain. " The worrisome physical exam findings include no significant finding on exam. " The initial radiographic and laboratory data are worrisome because of positive troponin. " The chronic co-morbidities include hyperlipidemia.   * I certify that at the point of admission it is my clinical judgment that the patient will require inpatient hospital care spanning beyond 2 midnights from the point of admission due to high intensity of service, high risk for further deterioration and high frequency of surveillance required.Philip Merino MD [facility] Pager [phone]  If 7PM-7AM, please contact night-coverage [email or web address] Password [facility]  10/12/2019, 10:38 PM

## 2019-10-12 NOTE — ED Provider Notes (Signed)
[facility] Emergency Department Provider Note  ____________________________________________   I have reviewed the triage vital signs and the nursing notes.   HISTORY  Chief Complaint Chest Pain   History limited by: Not Limited   HPI Philip Mack is a 69 y.o. male who presents to the emergency department today because of concern for chest pain.  The patient states that over the past week while he has been working out he would develop chest pain.  He would then stop working out.  Chest pain would be located in the center part of his chest.  Did have some radiation to his left arm.  Today he was simply sitting down when the chest pain started.  He did not appreciate any significant shortness of breath.  He thinks he might of had a little bit of flushing.  He states that 2 nights episodes come and go.  At the time my exam he is no longer having any pain.  Patient denies any history of heart disease.  States that he has been told his cholesterol is borderline although is not on any medication for it.  Records reviewed. Per medical record review patient has a history of HLD  Past Medical History:  Diagnosis Date  . Cataract   . Hepatitis   . Hyperlipidemia     Patient Active Problem List   Diagnosis Date Noted  . Right facial numbness 07/31/2019  . 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 Peyronie's disease 07/15/2017  . Advance directive discussed with patient 07/14/2016  . Right shoulder pain 07/08/2014  . Hyperlipidemia   . Preventative health care 01/26/2011    Past Surgical History:  Procedure Laterality Date  . Arm injury  1975  . COLONOSCOPY    . HERNIA REPAIR  1959   Bilateral, inguinal  . POLYPECTOMY    . TONSILLECTOMY AND ADENOIDECTOMY  1961    Prior to Admission medications   Medication Sig Start Date End Date Taking? Authorizing Provider  meloxicam (MOBIC) 15 MG tablet Take 1 tablet daily by mouth. 06/03/17    [provider]    Allergies Patient has no known allergies.  Family History  Problem Relation Age of Onset  . Cancer Mother        Lung, (surgically resected)  . Heart disease Mother   . Hearing loss Other        On Mom's side  . Hypertension Neg Hx   . Diabetes Neg Hx   . Colon cancer Neg Hx   . Colon polyps Neg Hx   . Stomach cancer Neg Hx   . Rectal cancer Neg Hx     Social History Social History   Tobacco Use  . Smoking status: Former Smoker    Types: Cigarettes    Quit date: 08/30/1980    Years since quitting: 39.1  . Smokeless tobacco: Never Used  Substance Use Topics  . Alcohol use: Yes    Comment: Occasionally  . Drug use: No    Review of Systems Constitutional: No fever/chills Eyes: No visual changes. ENT: No sore throat. Cardiovascular: Positive for chest pain. Respiratory: Denies shortness of breath. Gastrointestinal: No abdominal pain.  No nausea, no vomiting.  No diarrhea.   Genitourinary: Negative for dysuria. Musculoskeletal: Positive for left arm pain.  Skin: Negative for rash. Neurological: Negative for headaches, focal weakness or numbness.  ____________________________________________   PHYSICAL EXAM:  VITAL SIGNS: ED Triage Vitals  Enc Vitals Group     BP  10/12/19 1849 (!) 157/102     Pulse Rate 10/12/19 1849 67     Resp 10/12/19 1849 (!) 21     Temp 10/12/19 1851 97.7 F (36.5 C)     Temp Source 10/12/19 1851 Oral     SpO2 10/12/19 1849 99 %     Weight 10/12/19 1757 210 lb (95.3 kg)     Height 10/12/19 1757 6\' 4"  (1.93 m)     Head Circumference --      Peak Flow --      Pain Score 10/12/19 1756 3   Constitutional: Alert and oriented.  Eyes: Conjunctivae are normal.  ENT      Head: Normocephalic and atraumatic.      Nose: No congestion/rhinnorhea.      Mouth/Throat: Mucous membranes are moist.      Neck: No stridor. Hematological/Lymphatic/Immunilogical: No cervical lymphadenopathy. Cardiovascular: Normal rate,  regular rhythm.  No murmurs, rubs, or gallops.  Respiratory: Normal respiratory effort without tachypnea nor retractions. Breath sounds are clear and equal bilaterally. No wheezes/rales/rhonchi. Gastrointestinal: Soft and non tender. No rebound. No guarding.  Genitourinary: Deferred Musculoskeletal: Normal range of motion in all extremities. No lower extremity edema. Neurologic:  Normal speech and language. No gross focal neurologic deficits are appreciated.  Skin:  Skin is warm, dry and intact. No rash noted. Psychiatric: Mood and affect are normal. Speech and behavior are normal. Patient exhibits appropriate insight and judgment.  ____________________________________________    LABS (pertinent positives/negatives)  Trop hs 1204 CBC wbc 7.4, hgb 15.1, plt 190 BMP wnl except glu 132  ____________________________________________   EKG  I, Nance Pear, attending physician, personally viewed and interpreted this EKG  EKG Time: 1808 Rate: 65 Rhythm: normal sinus rhythm Axis: normal Intervals: qtc 388 QRS: narrow ST changes: no st elevation Impression: normal ekg  I, Nance Pear, attending physician, personally viewed and interpreted this EKG  EKG Time: 1905 Rate: 60 Rhythm: sinus rhythm Axis: right axis deviation Intervals: qtc 397 QRS: narrow ST changes: no st elevation Impression: abnormal ekg   ____________________________________________    RADIOLOGY  CXR No acute cardiopulmonary disease  ____________________________________________   PROCEDURES  Procedures  ____________________________________________   INITIAL IMPRESSION / ASSESSMENT AND PLAN / ED COURSE  Pertinent labs & imaging results that were available during my care of the patient were reviewed by me and considered in my medical decision making (see chart for details).   Patient presents to the emergency department today because of concerns for chest pain clinical history is  concerning for cardiac etiology given that the pain would come while he was working out.  Initial EKG without any ST elevation.  Troponin was significantly elevated at 1204.  I discussed this finding with patient.  Discussed concern for cardiac etiology and ACS.  Will give patient aspirin and heparin.  Will plan on admission. ____________________________________________   FINAL CLINICAL IMPRESSION(S) / ED DIAGNOSES  Final diagnoses:  Chest pain, unspecified type  Elevated troponin     Note: This dictation was prepared with Dragon dictation. Any transcriptional errors that result from this process are unintentional     Nance Pear, MD 10/12/19 1958

## 2019-10-12 NOTE — ED Notes (Addendum)
Attempted to call report- floor asking for name and number for call-back. Extension provided.

## 2019-10-12 NOTE — ED Triage Notes (Signed)
Pt presents to ED via POV c/o intermittent upper chest pain x30 min. Reports some nausea, denies SOB.

## 2019-10-13 ENCOUNTER — Inpatient Hospital Stay
Admit: 2019-10-13 | Discharge: 2019-10-13 | Disposition: A | Discharge status: Home / Self Care | Payer: Medicare Other | Attending: Internal Medicine | Admitting: Internal Medicine

## 2019-10-13 LAB — TROPONIN I (HIGH SENSITIVITY): Troponin I (High Sensitivity): 4698 ng/L (ref <18)

## 2019-10-13 LAB — CBC
HCT: 42.4 % (ref 39.0–52.0)
Hemoglobin: 14.3 g/dL (ref 13.0–17.0)
MCH: 30.6 pg (ref 26.0–34.0)
MCHC: 33.7 g/dL (ref 30.0–36.0)
MCV: 90.8 fL (ref 80.0–100.0)
Platelets: 158 10*3/uL (ref 150–400)
RBC: 4.67 MIL/uL (ref 4.22–5.81)
RDW: 13.1 % (ref 11.5–15.5)
WBC: 8.4 10*3/uL (ref 4.0–10.5)
nRBC: 0 % (ref 0.0–0.2)

## 2019-10-13 LAB — BASIC METABOLIC PANEL
Anion gap: 5 (ref 5–15)
BUN: 12 mg/dL (ref 8–23)
CO2: 28 mmol/L (ref 22–32)
Calcium: 8.6 mg/dL — ABNORMAL LOW (ref 8.9–10.3)
Chloride: 105 mmol/L (ref 98–111)
Creatinine, Ser: 0.84 mg/dL (ref 0.61–1.24)
GFR calc Af Amer: >60 mL/min (ref >60)
GFR calc non Af Amer: >60 mL/min (ref >60)
Glucose, Bld: 116 mg/dL — ABNORMAL HIGH (ref 70–99)
Potassium: 4 mmol/L (ref 3.5–5.1)
Sodium: 138 mmol/L (ref 135–145)

## 2019-10-13 LAB — HEPARIN LEVEL (UNFRACTIONATED)
Heparin Unfractionated: 0.53 IU/mL (ref 0.30–0.70)
Heparin Unfractionated: 0.54 IU/mL (ref 0.30–0.70)

## 2019-10-13 LAB — HIV ANTIBODY (ROUTINE TESTING W REFLEX): HIV Screen 4th Generation wRfx: NONREACTIVE

## 2019-10-13 MED ORDER — ATORVASTATIN CALCIUM 20 MG PO TABS
40.0000 mg | ORAL_TABLET | Freq: Every day | ORAL | Status: DC
  Administered 2019-10-13 – 2019-10-15 (×3): 40 mg via ORAL
  Filled 2019-10-13 (×3): qty 2

## 2019-10-13 MED ORDER — METOPROLOL TARTRATE 25 MG PO TABS
12.5000 mg | ORAL_TABLET | Freq: Two times a day (BID) | ORAL | Status: DC
  Administered 2019-10-13 – 2019-10-16 (×4): 12.5 mg via ORAL
  Filled 2019-10-13 (×5): qty 1

## 2019-10-13 MED ORDER — SODIUM CHLORIDE 0.9% FLUSH
3.0000 mL | Freq: Two times a day (BID) | INTRAVENOUS | Status: DC
  Administered 2019-10-14 – 2019-10-15 (×2): 3 mL via INTRAVENOUS

## 2019-10-13 NOTE — Consult Note (Signed)
ANTICOAGULATION CONSULT NOTE   Pharmacy Consult for Heparin Indication: chest pain/ACS  No Known Allergies  Patient Measurements: Height: 6\' 4"  (193 cm) Weight: 220 lb 11.2 oz (100.1 kg) IBW/kg (Calculated) : 86.8 Heparin Dosing Weight: 95.3 kg  Vital Signs: Temp: 97.7 F (36.5 C) (02/13 0742) Temp Source: Oral (02/13 0742) BP: 124/78 (02/13 0742) Pulse Rate: 54 (02/13 0742)  Labs: Recent Labs    10/12/19 1803 10/12/19 1803 10/12/19 1918 10/12/19 2154 10/12/19 2350 10/13/19 0201 10/13/19 1020  HGB 15.1  --   --   --   --  14.3  --   HCT 45.7  --   --   --   --  42.4  --   PLT 190  --   --   --   --  158  --   APTT 31  --   --   --   --   --   --   LABPROT 11.9  --   --   --   --   --   --   INR 0.9  --   --   --   --   --   --   HEPARINUNFRC  --   --   --   --   --  0.53 0.54  CREATININE 1.03  --   --   --   --   --  0.84  TROPONINIHS 1,204*   < > 1,585* 3,239* 4,698*  --   --    < > = values in this interval not displayed.    Estimated Creatinine Clearance: 103.3 mL/min (by C-G formula based on SCr of 0.84 mg/dL).   Medical History: Past Medical History:  Diagnosis Date  . Cataract   . Hepatitis   . Hyperlipidemia     Medications:  Medications Prior to Admission  Medication Sig Dispense Refill Last Dose  . meloxicam (MOBIC) 15 MG tablet Take 1 tablet by mouth daily as needed for pain.   1 10/11/2019 at prn   Scheduled:  . atorvastatin  40 mg Oral q1800  . metoprolol tartrate  25 mg Oral BID  . sodium chloride flush  3 mL Intravenous Q12H   Infusions:  . sodium chloride 100 mL/hr at 10/13/19 0835  . heparin 1,300 Units/hr (10/12/19 1936)   PRN:  Anti-infectives (From admission, onward)   None      Assessment: Pharmacy consulted to start heparin for ACS. No DOAC noted PTA.   2/13 0201 HL 0.53  2/13 1020 HL 0.54   Goal of Therapy:  Heparin level 0.3-0.7 units/ml Monitor platelets by anticoagulation protocol: Yes   Plan:  Heparin level  is therapeutic. Will continue current rate and will recheck HL and CBC with AM labs.,   Oswald Hillock, PharmD, BCPS 10/13/2019,11:05 AM

## 2019-10-13 NOTE — Progress Notes (Signed)
PROGRESS NOTE    Philip Mack  [id number] DOB: June 06, 1951 DOA: 10/12/2019 PCP: Venia Carbon, MD       Assessment & Plan:   Principal Problem:   NSTEMI (non-ST elevated myocardial infarction) [facility]) Active Problems:   Hyperlipidemia   BPH with obstruction/lower urinary tract symptoms   NSTEMI: continue on metoprolol, aspirin. Nitro, morphine prn for pain. Continue on IV heparin drip. Continue on tele. Troponins are elevated. Echo pending. Will go for a cardiac cath. Cardio following and recs apprec   HLD: will start a statin  Hyperglycemia: no hx of DM. Will continue to monitor    DVT prophylaxis: heparin drip  Code Status: full  Family Communication:  Disposition Plan:    Consultants:   Cardio    Procedures:    Antimicrobials:   Subjective: Pt c/o chest discomfort   Objective: Vitals:   10/12/19 2155 10/13/19 0051 10/13/19 0539 10/13/19 0742  BP: 129/74 114/65 124/73 124/78  Pulse: (!) 56 (!) 46 (!) 53 (!) 54  Resp:    18  Temp: 97.7 F (36.5 C)  98.2 F (36.8 C) 97.7 F (36.5 C)  TempSrc: Oral  Oral Oral  SpO2: 99%  96% 99%  Weight: 100.1 kg     Height: 6\' 4"  (1.93 m)       Intake/Output Summary (Last 24 hours) at 10/13/2019 0812 Last data filed at 10/13/2019 0509 Gross per 24 hour  Intake --  Output 575 ml  Net -575 ml   Filed Weights   10/12/19 1757 10/12/19 2155  Weight: 95.3 kg 100.1 kg    Examination:  General exam: Appears calm and comfortable  Respiratory system: Clear to auscultation. No rales, rhonchi  Cardiovascular system: S1 & S2 +. No rubs, gallops or clicks.. Gastrointestinal system: Abdomen is nondistended, soft and nontender. Normal bowel sounds heard. Central nervous system: Alert and oriented. Moves all 4 extremities  Psychiatry: Judgement and insight appear normal. Mood & affect appropriate.     Data Reviewed: I have personally reviewed following labs and imaging studies  CBC: Recent Labs  Lab  10/12/19 1803 10/13/19 0201  WBC 7.4 8.4  HGB 15.1 14.3  HCT 45.7 42.4  MCV 90.7 90.8  PLT 190 0000000   Basic Metabolic Panel: Recent Labs  Lab 10/12/19 1803  NA 139  K 4.1  CL 102  CO2 26  GLUCOSE 132*  BUN 18  CREATININE 1.03  CALCIUM 9.5   GFR: Estimated Creatinine Clearance: 84.3 mL/min (by C-G formula based on SCr of 1.03 mg/dL). Liver Function Tests: No results for input(s): AST, ALT, ALKPHOS, BILITOT, PROT, ALBUMIN in the last 168 hours. No results for input(s): LIPASE, AMYLASE in the last 168 hours. No results for input(s): AMMONIA in the last 168 hours. Coagulation Profile: Recent Labs  Lab 10/12/19 1803  INR 0.9   Cardiac Enzymes: No results for input(s): CKTOTAL, CKMB, CKMBINDEX, TROPONINI in the last 168 hours. BNP (last 3 results) No results for input(s): PROBNP in the last 8760 hours. HbA1C: No results for input(s): HGBA1C in the last 72 hours. CBG: No results for input(s): GLUCAP in the last 168 hours. Lipid Profile: Recent Labs    10/12/19 2154  CHOL 231*  HDL 67  LDLCALC 156*  TRIG 41  CHOLHDL 3.4   Thyroid Function Tests: No results for input(s): TSH, T4TOTAL, FREET4, T3FREE, THYROIDAB in the last 72 hours. Anemia Panel: No results for input(s): VITAMINB12, FOLATE, FERRITIN, TIBC, IRON, RETICCTPCT in the last 72 hours. Sepsis Labs: No  results for input(s): PROCALCITON, LATICACIDVEN in the last 168 hours.  Recent Results (from the past 240 hour(s))  Respiratory Panel by RT PCR (Flu A&B, Covid) - Nasopharyngeal Swab     Status: None   Collection Time: 10/12/19  7:05 PM   Specimen: Nasopharyngeal Swab  Result Value Ref Range Status   SARS Coronavirus 2 by RT PCR NEGATIVE NEGATIVE Final    Comment: (NOTE) SARS-CoV-2 target nucleic acids are NOT DETECTED. The SARS-CoV-2 RNA is generally detectable in upper respiratoy specimens during the acute phase of infection. The lowest concentration of SARS-CoV-2 viral copies this assay can detect  is 131 copies/mL. A negative result does not preclude SARS-Cov-2 infection and should not be used as the sole basis for treatment or other patient management decisions. A negative result may occur with  improper specimen collection/handling, submission of specimen other than nasopharyngeal swab, presence of viral mutation(s) within the areas targeted by this assay, and inadequate number of viral copies (<131 copies/mL). A negative result must be combined with clinical observations, patient history, and epidemiological information. The expected result is Negative. Fact Sheet for Patients:  PinkCheek.be Fact Sheet for Healthcare Providers:  GravelBags.it This test is not yet ap proved or cleared by the [place] FDA and  has been authorized for detection and/or diagnosis of SARS-CoV-2 by FDA under an Emergency Use Authorization (EUA). This EUA will remain  in effect (meaning this test can be used) for the duration of the COVID-19 declaration under Section 564(b)(1) of the Act, 21 U.S.C. section 360bbb-3(b)(1), unless the authorization is terminated or revoked sooner.    Influenza A by PCR NEGATIVE NEGATIVE Final   Influenza B by PCR NEGATIVE NEGATIVE Final    Comment: (NOTE) The Xpert Xpress SARS-CoV-2/FLU/RSV assay is intended as an aid in  the diagnosis of influenza from Nasopharyngeal swab specimens and  should not be used as a sole basis for treatment. Nasal washings and  aspirates are unacceptable for Xpert Xpress SARS-CoV-2/FLU/RSV  testing. Fact Sheet for Patients: PinkCheek.be Fact Sheet for Healthcare Providers: GravelBags.it This test is not yet approved or cleared by the [place] FDA and  has been authorized for detection and/or diagnosis of SARS-CoV-2 by  FDA under an Emergency Use Authorization (EUA). This EUA will remain  in effect (meaning this  test can be used) for the duration of the  Covid-19 declaration under Section 564(b)(1) of the Act, 21  U.S.C. section 360bbb-3(b)(1), unless the authorization is  terminated or revoked. Performed at [facility], [address], [place], [place] [address]          Radiology Studies: DG Chest 2 View  Result Date: 10/12/2019 CLINICAL DATA:  Chest pain EXAM: CHEST - 2 VIEW COMPARISON:  None. FINDINGS: Heart is normal size. Lungs are clear. No effusion or pneumothorax. Concern for left lateral rib fracture involving the left 6th rib. Recommend correlation for pain in this area. This is age indeterminate. IMPRESSION: No acute cardiopulmonary disease. Concern for left lateral 6th rib fracture, age indeterminate. Recommend correlation for pain in this area. Electronically Signed   By: Rolm Baptise M.D.   On: 10/12/2019 18:22        Scheduled Meds: . metoprolol tartrate  25 mg Oral BID   Continuous Infusions: . sodium chloride 100 mL/hr at 10/12/19 2235  . heparin 1,300 Units/hr (10/12/19 1936)     LOS: 1 day    Time spent: 30 mins     Wyvonnia Dusky, MD [facility] Pager  [phone]  If 7PM-7AM, please contact night-coverage [email or web address] 10/13/2019, 8:12 AM

## 2019-10-13 NOTE — Consult Note (Signed)
ANTICOAGULATION CONSULT NOTE   Pharmacy Consult for Heparin Indication: chest pain/ACS  No Known Allergies  Patient Measurements: Height: 6\' 4"  (193 cm) Weight: 220 lb 11.2 oz (100.1 kg) IBW/kg (Calculated) : 86.8 Heparin Dosing Weight: 95.3 kg  Vital Signs: Temp: 97.7 F (36.5 C) (02/12 2155) Temp Source: Oral (02/12 2155) BP: 114/65 (02/13 0051) Pulse Rate: 46 (02/13 0051)  Labs: Recent Labs    10/12/19 1803 10/12/19 1803 10/12/19 1918 10/12/19 2154 10/12/19 2350 10/13/19 0201  HGB 15.1  --   --   --   --  14.3  HCT 45.7  --   --   --   --  42.4  PLT 190  --   --   --   --  158  APTT 31  --   --   --   --   --   LABPROT 11.9  --   --   --   --   --   INR 0.9  --   --   --   --   --   HEPARINUNFRC  --   --   --   --   --  0.53  CREATININE 1.03  --   --   --   --   --   TROPONINIHS 1,204*   < > 1,585* 3,239* 4,698*  --    < > = values in this interval not displayed.    Estimated Creatinine Clearance: 84.3 mL/min (by C-G formula based on SCr of 1.03 mg/dL).   Medical History: Past Medical History:  Diagnosis Date  . Cataract   . Hepatitis   . Hyperlipidemia     Medications:  Medications Prior to Admission  Medication Sig Dispense Refill Last Dose  . meloxicam (MOBIC) 15 MG tablet Take 1 tablet by mouth daily as needed for pain.   1 10/11/2019 at prn   Scheduled:  . metoprolol tartrate  25 mg Oral BID   Infusions:  . sodium chloride 100 mL/hr at 10/12/19 2235  . heparin 1,300 Units/hr (10/12/19 1936)   PRN:  Anti-infectives (From admission, onward)   None      Assessment: Pharmacy consulted to start heparin for ACS. No DOAC noted PTA.  Goal of Therapy:  Heparin level 0.3-0.7 units/ml Monitor platelets by anticoagulation protocol: Yes   Plan:  02/13 @ 0200 HL 0.53 therapeutic. Will continue current rate and will recheck HL at 1000, CBC trended down slightly likely d/t dilution, will continue to monitor.  Tobie Lords, PharmD,  BCPS 10/13/2019,2:51 AM

## 2019-10-13 NOTE — Consult Note (Signed)
[facility] Consultation Note  Patient ID: Philip Mack, MRN: [id number], DOB/AGE: 1951/05/07 69 y.o. Admit date: 10/12/2019   Date of Consult: 10/13/2019 Primary Physician: Venia Carbon, MD Primary Cardiologist: None  Chief Complaint:  Chief Complaint  Patient presents with  . Chest Pain   Reason for Consult: Non-ST elevation myocardial infarction  HPI: 69 y.o. male with borderline hypertension hyperlipidemia who is a very active person doing lots of physical activity on a regular basis having episodes of mild amount of chest pain at peak stress off and on over the last several weeks.  He did have chest pain at rest requiring admission to the emergency room for which was severe in nature.  At that time the patient did have some ST changes in lead V1 and V2 with appropriate medication management the patient had full resolution of those EKG changes.  Additionally the patient does have an elevated troponin IV 698 consistent with non-ST elevation myocardial infarction.  Currently with heparin metoprolol and high intensity cholesterol therapy the patient is feeling much better and had no symptoms and is hemodynamically stable  Past Medical History:  Diagnosis Date  . Cataract   . Hepatitis   . Hyperlipidemia       Surgical History:  Past Surgical History:  Procedure Laterality Date  . Arm injury  1975  . COLONOSCOPY    . HERNIA REPAIR  1959   Bilateral, inguinal  . POLYPECTOMY    . TONSILLECTOMY AND ADENOIDECTOMY  1961     Home Meds: Prior to Admission medications   Medication Sig Start Date End Date Taking? Authorizing Provider  meloxicam (MOBIC) 15 MG tablet Take 1 tablet by mouth daily as needed for pain.  06/03/17  Yes [provider]    Inpatient Medications:  . atorvastatin  40 mg Oral q1800  . metoprolol tartrate  25 mg Oral BID   . sodium chloride 100 mL/hr at 10/13/19 0835  . heparin 1,300 Units/hr (10/12/19 1936)    Allergies: No  Known Allergies  Social History   Socioeconomic History  . Marital status: Married    Spouse name: Not on file  . Number of children: 2  . Years of education: Not on file  . Highest education level: Not on file  Occupational History  . Occupation: Retired Human resources officer  . Smoking status: Former Smoker    Types: Cigarettes    Quit date: 08/30/1980    Years since quitting: 39.1  . Smokeless tobacco: Never Used  Substance and Sexual Activity  . Alcohol use: Yes    Comment: Occasionally  . Drug use: No  . Sexual activity: Not on file  Other Topics Concern  . Not on file  Social History Narrative   Has living will   Wife is health care POA-- then son Randall Hiss   Would accept resuscitation   No tube feeds if cognitively unaware   Social Determinants of Health   Financial Resource Strain:   . Difficulty of Paying Living Expenses: Not on file  Food Insecurity:   . Worried About Charity fundraiser in the Last Year: Not on file  . Ran Out of Food in the Last Year: Not on file  Transportation Needs:   . Lack of Transportation (Medical): Not on file  . Lack of Transportation (Non-Medical): Not on file  Physical Activity:   . Days of Exercise per Week: Not on file  . Minutes of Exercise per Session: Not on  file  Stress:   . Feeling of Stress : Not on file  Social Connections:   . Frequency of Communication with Friends and Family: Not on file  . Frequency of Social Gatherings with Friends and Family: Not on file  . Attends Religious Services: Not on file  . Active Member of Clubs or Organizations: Not on file  . Attends Archivist Meetings: Not on file  . Marital Status: Not on file  Intimate Partner Violence:   . Fear of Current or Ex-Partner: Not on file  . Emotionally Abused: Not on file  . Physically Abused: Not on file  . Sexually Abused: Not on file     Family History  Problem Relation Age of Onset  . Cancer Mother        Lung, (surgically  resected)  . Heart disease Mother   . Hearing loss Other        On Mom's side  . Hypertension Neg Hx   . Diabetes Neg Hx   . Colon cancer Neg Hx   . Colon polyps Neg Hx   . Stomach cancer Neg Hx   . Rectal cancer Neg Hx      Review of Systems Positive for none Negative for: General:  chills, fever, night sweats or weight changes.  Cardiovascular: PND orthopnea syncope dizziness  Dermatological skin lesions rashes Respiratory: Cough congestion Urologic: Frequent urination urination at night and hematuria Abdominal: negative for nausea, vomiting, diarrhea, bright red blood per rectum, melena, or hematemesis Neurologic: negative for visual changes, and/or hearing changes  All other systems reviewed and are otherwise negative except as noted above.  Labs: No results for input(s): CKTOTAL, CKMB, TROPONINI in the last 72 hours. Lab Results  Component Value Date   WBC 8.4 10/13/2019   HGB 14.3 10/13/2019   HCT 42.4 10/13/2019   MCV 90.8 10/13/2019   PLT 158 10/13/2019    Recent Labs  Lab 10/12/19 1803  NA 139  K 4.1  CL 102  CO2 26  BUN 18  CREATININE 1.03  CALCIUM 9.5  GLUCOSE 132*   Lab Results  Component Value Date   CHOL 231 (H) 10/12/2019   HDL 67 10/12/2019   LDLCALC 156 (H) 10/12/2019   TRIG 41 10/12/2019   No results found for: DDIMER  Radiology/Studies:  DG Chest 2 View  Result Date: 10/12/2019 CLINICAL DATA:  Chest pain EXAM: CHEST - 2 VIEW COMPARISON:  None. FINDINGS: Heart is normal size. Lungs are clear. No effusion or pneumothorax. Concern for left lateral rib fracture involving the left 6th rib. Recommend correlation for pain in this area. This is age indeterminate. IMPRESSION: No acute cardiopulmonary disease. Concern for left lateral 6th rib fracture, age indeterminate. Recommend correlation for pain in this area. Electronically Signed   By: Rolm Baptise M.D.   On: 10/12/2019 18:22    EKG: Normal sinus rhythm with ST changes in lead V1 and V2 now  resolved  Weights: Filed Weights   10/12/19 1757 10/12/19 2155  Weight: 95.3 kg 100.1 kg     Physical Exam: Blood pressure 124/78, pulse (!) 54, temperature 97.7 F (36.5 C), temperature source Oral, resp. rate 18, height 6\' 4"  (1.93 m), weight 100.1 kg, SpO2 99 %. Body mass index is 26.86 kg/m. General: Well developed, well nourished, in no acute distress. Head eyes ears nose throat: Normocephalic, atraumatic, sclera non-icteric, no xanthomas, nares are without discharge. No apparent thyromegaly and/or mass  Lungs: Normal respiratory effort.  no wheezes, no  rales, no rhonchi.  Heart: RRR with normal S1 S2. no murmur gallop, no rub, PMI is normal size and placement, carotid upstroke normal without bruit, jugular venous pressure is normal Abdomen: Soft, non-tender, non-distended with normoactive bowel sounds. No hepatomegaly. No rebound/guarding. No obvious abdominal masses. Abdominal aorta is normal size without bruit Extremities: No edema. no cyanosis, no clubbing, no ulcers  Peripheral : 2+ bilateral upper extremity pulses, 2+ bilateral femoral pulses, 2+ bilateral dorsal pedal pulse Neuro: Alert and oriented. No facial asymmetry. No focal deficit. Moves all extremities spontaneously. Musculoskeletal: Normal muscle tone without kyphosis Psych:  Responds to questions appropriately with a normal affect.    Assessment: 69 year old male with borderline hypertension hyperlipidemia having a non-ST elevation myocardial infarction  Plan: 1.  Continue heparin for further risk reduction of myocardial infarction 2.  Aspirin for further risk reduction of myocardial infarction and nitrates as necessary for intermittent episodes of chest discomfort 3.  Metoprolol for myocardial infarction and high intensity cholesterol therapy 4.  Echocardiogram for LV systolic dysfunction valvular heart disease contributing to above 5.  Proceed to cardiac catheterization to assess coronary anatomy and further  treatment thereof is necessary.  Patient understands the risk and benefits of cardiac catheterization.  This includes the possibility of death stroke heart attack infection bleeding or blood clot.  Patient is a understanding of risks and benefits and is at low risk for conscious sedation  Signed, Corey Skains M.D. [facility] 10/13/2019, 8:43 AM

## 2019-10-14 LAB — CBC
HCT: 39.4 % (ref 39.0–52.0)
Hemoglobin: 12.8 g/dL — ABNORMAL LOW (ref 13.0–17.0)
MCH: 29.7 pg (ref 26.0–34.0)
MCHC: 32.5 g/dL (ref 30.0–36.0)
MCV: 91.4 fL (ref 80.0–100.0)
Platelets: 137 10*3/uL — ABNORMAL LOW (ref 150–400)
RBC: 4.31 MIL/uL (ref 4.22–5.81)
RDW: 13 % (ref 11.5–15.5)
WBC: 5.7 10*3/uL (ref 4.0–10.5)
nRBC: 0 % (ref 0.0–0.2)

## 2019-10-14 LAB — BASIC METABOLIC PANEL
Anion gap: 5 (ref 5–15)
BUN: 10 mg/dL (ref 8–23)
CO2: 25 mmol/L (ref 22–32)
Calcium: 8.5 mg/dL — ABNORMAL LOW (ref 8.9–10.3)
Chloride: 109 mmol/L (ref 98–111)
Creatinine, Ser: 0.86 mg/dL (ref 0.61–1.24)
GFR calc Af Amer: >60 mL/min (ref >60)
GFR calc non Af Amer: >60 mL/min (ref >60)
Glucose, Bld: 96 mg/dL (ref 70–99)
Potassium: 4.2 mmol/L (ref 3.5–5.1)
Sodium: 139 mmol/L (ref 135–145)

## 2019-10-14 LAB — HEPARIN LEVEL (UNFRACTIONATED): Heparin Unfractionated: 0.53 IU/mL (ref 0.30–0.70)

## 2019-10-14 LAB — ECHOCARDIOGRAM COMPLETE
Height: 76 in
Weight: 3531.2 oz

## 2019-10-14 MED ORDER — SODIUM CHLORIDE 0.9 % IV SOLN: 250.0000 mL | INTRAVENOUS | Status: DC | PRN

## 2019-10-14 NOTE — Progress Notes (Signed)
[facility] Cardiology [facility] Encounter Note  Patient: Philip Mack / Admit Date: 10/12/2019 / Date of Encounter: 10/14/2019, 7:01 AM   Subjective: Patient feels much better today.  No evidence of chest discomfort or shortness of breath.  No evidence of congestive heart failure symptoms.  Troponin level elevated consistent with non-ST elevation myocardial infarction with EKG suggesting anterior wall distribution Echocardiogram shows apical hypokinesis with ejection fraction of 40%  Review of Systems: Positive for: None Negative for: Vision change, hearing change, syncope, dizziness, nausea, vomiting,diarrhea, bloody stool, stomach pain, cough, congestion, diaphoresis, urinary frequency, urinary pain,skin lesions, skin rashes Others previously listed  Objective: Telemetry: Normal sinus rhythm Physical Exam: Blood pressure 109/62, pulse (!) 50, temperature 98.3 F (36.8 C), temperature source Oral, resp. rate 18, height 6\' 4"  (1.93 m), weight 98.6 kg, SpO2 96 %. Body mass index is 26.45 kg/m. General: Well developed, well nourished, in no acute distress. Head: Normocephalic, atraumatic, sclera non-icteric, no xanthomas, nares are without discharge. Neck: No apparent masses Lungs: Normal respirations with no wheezes, no rhonchi, no rales , no crackles   Heart: Regular rate and rhythm, normal S1 S2, no murmur, no rub, no gallop, PMI is normal size and placement, carotid upstroke normal without bruit, jugular venous pressure normal Abdomen: Soft, non-tender, non-distended with normoactive bowel sounds. No hepatosplenomegaly. Abdominal aorta is normal size without bruit Extremities: No edema, no clubbing, no cyanosis, no ulcers,  Peripheral: 2+ radial, 2+ femoral, 2+ dorsal pedal pulses Neuro: Alert and oriented. Moves all extremities spontaneously. Psych:  Responds to questions appropriately with a normal affect.   Intake/Output Summary (Last 24 hours) at 10/14/2019 0701 Last data  filed at 10/14/2019 0013 Gross per 24 hour  Intake 2767.07 ml  Output 1950 ml  Net 817.07 ml    Inpatient Medications:  . atorvastatin  40 mg Oral q1800  . metoprolol tartrate  12.5 mg Oral BID  . sodium chloride flush  3 mL Intravenous Q12H   Infusions:  . sodium chloride 100 mL/hr at 10/14/19 0446  . heparin 1,300 Units/hr (10/12/19 1936)    Labs: Recent Labs    10/12/19 1803 10/13/19 1020  NA 139 138  K 4.1 4.0  CL 102 105  CO2 26 28  GLUCOSE 132* 116*  BUN 18 12  CREATININE 1.03 0.84  CALCIUM 9.5 8.6*   No results for input(s): AST, ALT, ALKPHOS, BILITOT, PROT, ALBUMIN in the last 72 hours. Recent Labs    10/13/19 0201 10/14/19 0555  WBC 8.4 5.7  HGB 14.3 12.8*  HCT 42.4 39.4  MCV 90.8 91.4  PLT 158 137*   No results for input(s): CKTOTAL, CKMB, TROPONINI in the last 72 hours. Invalid input(s): POCBNP No results for input(s): HGBA1C in the last 72 hours.   Weights: Filed Weights   10/12/19 1757 10/12/19 2155 10/14/19 0423  Weight: 95.3 kg 100.1 kg 98.6 kg     Radiology/Studies:  DG Chest 2 View  Result Date: 10/12/2019 CLINICAL DATA:  Chest pain EXAM: CHEST - 2 VIEW COMPARISON:  None. FINDINGS: Heart is normal size. Lungs are clear. No effusion or pneumothorax. Concern for left lateral rib fracture involving the left 6th rib. Recommend correlation for pain in this area. This is age indeterminate. IMPRESSION: No acute cardiopulmonary disease. Concern for left lateral 6th rib fracture, age indeterminate. Recommend correlation for pain in this area. Electronically Signed   By: Rolm Baptise M.D.   On: 10/12/2019 18:22   ECHOCARDIOGRAM COMPLETE  Result Date: 10/14/2019  ECHOCARDIOGRAM REPORT   Patient Name:   Philip Mack Date of Exam: 10/13/2019 Medical Rec #:  [id number]        Height:       76.0 in Accession #:    [id number]       Weight:       220.7 lb Date of Birth:  04-19-1951       BSA:          2.31 m Patient Age:    69 years         BP:            124/78 mmHg Patient Gender: M                HR:           55 bpm. Exam Location:  [facility] Procedure: 2D Echo Indications:     NSTEMI I21.4  History:         Patient has no prior history of Echocardiogram examinations.                  Risk Factors:Dyslipidemia.  Sonographer:     Avanell Shackleton Referring Phys:  [place] Diagnosing Phys: [name] MD IMPRESSIONS  1. Left ventricular ejection fraction, by estimation, is 40 to 45%. The left ventricle has mildly decreased function. The left ventricle demonstrates regional wall motion abnormalities (see scoring diagram/findings for description). Left ventricular diastolic parameters were normal.  2. Right ventricular systolic function is normal. The right ventricular size is normal.  3. The mitral valve is normal in structure and function. Mild mitral valve regurgitation.  4. The aortic valve is normal in structure and function. Aortic valve regurgitation is trivial. FINDINGS  Left Ventricle: Left ventricular ejection fraction, by estimation, is 40 to 45%. The left ventricle has mildly decreased function. The left ventricle demonstrates regional wall motion abnormalities. Mild hypokinesis of the left ventricular, apical apical segment. There is no left ventricular hypertrophy. Left ventricular diastolic parameters were normal.  LV Wall Scoring: The apical septal segment is hypokinetic. Right Ventricle: The right ventricular size is normal. No increase in right ventricular wall thickness. Right ventricular systolic function is normal. Left Atrium: Left atrial size was normal in size. Right Atrium: Right atrial size was normal in size. Pericardium: There is no evidence of pericardial effusion. Mitral Valve: The mitral valve is normal in structure and function. Mild mitral valve regurgitation. Tricuspid Valve: The tricuspid valve is normal in structure. Tricuspid valve regurgitation is trivial. Aortic Valve: The aortic valve is normal in structure and  function. Aortic valve regurgitation is trivial. Pulmonic Valve: The pulmonic valve was grossly normal. Pulmonic valve regurgitation is not visualized. Aorta: The aortic root and ascending aorta are structurally normal, with no evidence of dilitation. IAS/Shunts: No atrial level shunt detected by color flow Doppler.  LEFT VENTRICLE PLAX 2D LVIDd:         5.98 cm  Diastology LVIDs:         4.24 cm  LV e' lateral:   6.96 cm/s LV PW:         0.89 cm  LV E/e' lateral: 6.4 LV IVS:        0.85 cm  LV e' medial:    5.77 cm/s LVOT diam:     2.30 cm  LV E/e' medial:  7.7 LV SV Index:   42.23 LVOT Area:     4.15 cm  RIGHT VENTRICLE  IVC RV S prime:     13.70 cm/s  IVC diam: 2.71 cm LEFT ATRIUM             Index       RIGHT ATRIUM           Index LA diam:        4.20 cm 1.82 cm/m  RA Area:     21.40 cm LA Vol (A2C):   57.5 ml 24.89 ml/m RA Volume:   62.10 ml  26.88 ml/m LA Vol (A4C):   47.8 ml 20.69 ml/m LA Biplane Vol: 53.3 ml 23.07 ml/m   AORTA Ao Root diam: 3.30 cm MITRAL VALVE MV Area (PHT): 2.66 cm    SHUNTS MV Decel Time: 285 msec    Systemic Diam: 2.30 cm MV E velocity: 44.20 cm/s MV A velocity: 64.10 cm/s MV E/A ratio:  0.69 [name] MD Electronically signed by [name] MD Signature Date/Time: 10/14/2019/6:22:25 AM    Final      Assessment and Recommendation  69 y.o. male with hyperlipidemia hypertension having acute anterior apical myocardial infarction without evidence of congestive heart failure 1.  Continue heparin and aspirin for further risk reduction myocardial infarction 2.  Continue metoprolol watching closely for bradycardia for myocardial infarction 3.  High intensity cholesterol therapy 4.  Proceed to cardiac catheterization to assess coronary anatomy and further treatment thereof is necessary.  Patient understands the risk and benefits of cardiac catheterization.  This includes the possibility death stroke heart attack infection bleeding or blood clot.  He is at low  risk for conscious sedation  Signed, [name] M.D. FACC

## 2019-10-14 NOTE — Consult Note (Signed)
ANTICOAGULATION CONSULT NOTE   Pharmacy Consult for Heparin Indication: chest pain/ACS  No Known Allergies  Patient Measurements: Height: 6\' 4"  (193 cm) Weight: 217 lb 4.8 oz (98.6 kg) IBW/kg (Calculated) : 86.8 Heparin Dosing Weight: 95.3 kg  Vital Signs: Temp: 98.3 F (36.8 C) (02/14 0423) Temp Source: Oral (02/14 0423) BP: 109/62 (02/14 0423) Pulse Rate: 50 (02/14 0423)  Labs: Recent Labs    10/12/19 1803 10/12/19 1803 10/12/19 1918 10/12/19 2154 10/12/19 2350 10/13/19 0201 10/13/19 1020 10/14/19 0555  HGB 15.1   < >  --   --   --  14.3  --  12.8*  HCT 45.7  --   --   --   --  42.4  --  39.4  PLT 190  --   --   --   --  158  --  137*  APTT 31  --   --   --   --   --   --   --   LABPROT 11.9  --   --   --   --   --   --   --   INR 0.9  --   --   --   --   --   --   --   HEPARINUNFRC  --   --   --   --   --  0.53 0.54 0.53  CREATININE 1.03  --   --   --   --   --  0.84  --   TROPONINIHS 1,204*   < > 1,585* 3,239* 4,698*  --   --   --    < > = values in this interval not displayed.    Estimated Creatinine Clearance: 103.3 mL/min (by C-G formula based on SCr of 0.84 mg/dL).   Medical History: Past Medical History:  Diagnosis Date  . Cataract   . Hepatitis   . Hyperlipidemia     Medications:  Medications Prior to Admission  Medication Sig Dispense Refill Last Dose  . meloxicam (MOBIC) 15 MG tablet Take 1 tablet by mouth daily as needed for pain.   1 10/11/2019 at prn   Scheduled:  . atorvastatin  40 mg Oral q1800  . metoprolol tartrate  12.5 mg Oral BID  . sodium chloride flush  3 mL Intravenous Q12H   Infusions:  . sodium chloride 100 mL/hr at 10/14/19 0446  . heparin 1,300 Units/hr (10/12/19 1936)   PRN:  Anti-infectives (From admission, onward)   None      Assessment: Pharmacy consulted to start heparin for ACS. No DOAC noted PTA.   2/13 0201 HL 0.53  2/13 1020 HL 0.54  2/14 0555 HL 0.53   Goal of Therapy:  Heparin level 0.3-0.7  units/ml Monitor platelets by anticoagulation protocol: Yes   Plan:  2/14 HL 0.53. Heparin level remains therapeutic. Will continue current infusion rate of 1300 units.hr. Platelets trending down (Plts: 190>> 158>> 137). Continue to monitor.  Will recheck HL and CBC with AM labs   Pernell Dupre, PharmD, BCPS 10/14/2019,6:44 AM

## 2019-10-14 NOTE — Progress Notes (Signed)
PROGRESS NOTE    Philip Mack  [id number] DOB: [birth year] DOA: 10/12/2019 PCP: Venia Carbon, MD       Assessment & Plan:   Principal Problem:   NSTEMI (non-ST elevated myocardial infarction) [facility]) Active Problems:   Hyperlipidemia   BPH with obstruction/lower urinary tract symptoms   NSTEMI: continue on metoprolol, aspirin. Nitro, morphine prn for pain. Continue on IV heparin drip. Continue on tele. Troponins are elevated. Echo shows EF 40-45%, LV shows regional wall motion abnormalities, diastolic function was normal. Will go for a cardiac cath tomorrow. Cardio following and recs apprec   Thrombocytopenia: etiology unclear. Will continue to monitor   HLD: will continue on atorvastatin  Hyperglycemia: no hx of DM. Will continue to monitor    DVT prophylaxis: heparin drip  Code Status: full  Family Communication:  Disposition Plan:    Consultants:   Cardio    Procedures:    Antimicrobials:   Subjective: Pt c/o fatigue  Objective: Vitals:   10/13/19 0742 10/13/19 1707 10/13/19 2015 10/14/19 0423  BP: 124/78 118/71 133/62 109/62  Pulse: (!) 54 (!) 52 (!) 54 (!) 50  Resp: 18 18 16 18   Temp: 97.7 F (36.5 C) 98.1 F (36.7 C) 98.1 F (36.7 C) 98.3 F (36.8 C)  TempSrc: Oral Oral Oral Oral  SpO2: 99% 98% 96% 96%  Weight:    98.6 kg  Height:        Intake/Output Summary (Last 24 hours) at 10/14/2019 0746 Last data filed at 10/14/2019 0013 Gross per 24 hour  Intake 2767.07 ml  Output 1950 ml  Net 817.07 ml   Filed Weights   10/12/19 1757 10/12/19 2155 10/14/19 0423  Weight: 95.3 kg 100.1 kg 98.6 kg    Examination:  General exam: Appears calm and comfortable  Respiratory system: Clear to auscultation. No wheezes  Cardiovascular system: S1 & S2 +. No rubs, gallops or clicks.. Gastrointestinal system: Abdomen is nondistended, soft and nontender. Normal bowel sounds heard. Central nervous system: Alert and oriented. Moves all 4  extremities  Psychiatry: Judgement and insight appear normal. Mood & affect appropriate.     Data Reviewed: I have personally reviewed following labs and imaging studies  CBC: Recent Labs  Lab 10/12/19 1803 10/13/19 0201 10/14/19 0555  WBC 7.4 8.4 5.7  HGB 15.1 14.3 12.8*  HCT 45.7 42.4 39.4  MCV 90.7 90.8 91.4  PLT 190 158 0000000*   Basic Metabolic Panel: Recent Labs  Lab 10/12/19 1803 10/13/19 1020  NA 139 138  K 4.1 4.0  CL 102 105  CO2 26 28  GLUCOSE 132* 116*  BUN 18 12  CREATININE 1.03 0.84  CALCIUM 9.5 8.6*   GFR: Estimated Creatinine Clearance: 103.3 mL/min (by C-G formula based on SCr of 0.84 mg/dL). Liver Function Tests: No results for input(s): AST, ALT, ALKPHOS, BILITOT, PROT, ALBUMIN in the last 168 hours. No results for input(s): LIPASE, AMYLASE in the last 168 hours. No results for input(s): AMMONIA in the last 168 hours. Coagulation Profile: Recent Labs  Lab 10/12/19 1803  INR 0.9   Cardiac Enzymes: No results for input(s): CKTOTAL, CKMB, CKMBINDEX, TROPONINI in the last 168 hours. BNP (last 3 results) No results for input(s): PROBNP in the last 8760 hours. HbA1C: No results for input(s): HGBA1C in the last 72 hours. CBG: No results for input(s): GLUCAP in the last 168 hours. Lipid Profile: Recent Labs    10/12/19 2154  CHOL 231*  HDL 67  LDLCALC 156*  TRIG 41  CHOLHDL 3.4   Thyroid Function Tests: No results for input(s): TSH, T4TOTAL, FREET4, T3FREE, THYROIDAB in the last 72 hours. Anemia Panel: No results for input(s): VITAMINB12, FOLATE, FERRITIN, TIBC, IRON, RETICCTPCT in the last 72 hours. Sepsis Labs: No results for input(s): PROCALCITON, LATICACIDVEN in the last 168 hours.  Recent Results (from the past 240 hour(s))  Respiratory Panel by RT PCR (Flu A&B, Covid) - Nasopharyngeal Swab     Status: None   Collection Time: 10/12/19  7:05 PM   Specimen: Nasopharyngeal Swab  Result Value Ref Range Status   SARS Coronavirus 2 by  RT PCR NEGATIVE NEGATIVE Final    Comment: (NOTE) SARS-CoV-2 target nucleic acids are NOT DETECTED. The SARS-CoV-2 RNA is generally detectable in upper respiratoy specimens during the acute phase of infection. The lowest concentration of SARS-CoV-2 viral copies this assay can detect is 131 copies/mL. A negative result does not preclude SARS-Cov-2 infection and should not be used as the sole basis for treatment or other patient management decisions. A negative result may occur with  improper specimen collection/handling, submission of specimen other than nasopharyngeal swab, presence of viral mutation(s) within the areas targeted by this assay, and inadequate number of viral copies (<131 copies/mL). A negative result must be combined with clinical observations, patient history, and epidemiological information. The expected result is Negative. Fact Sheet for Patients:  PinkCheek.be Fact Sheet for Healthcare Providers:  GravelBags.it This test is not yet ap proved or cleared by the [place] FDA and  has been authorized for detection and/or diagnosis of SARS-CoV-2 by FDA under an Emergency Use Authorization (EUA). This EUA will remain  in effect (meaning this test can be used) for the duration of the COVID-19 declaration under Section 564(b)(1) of the Act, 21 U.S.C. section 360bbb-3(b)(1), unless the authorization is terminated or revoked sooner.    Influenza A by PCR NEGATIVE NEGATIVE Final   Influenza B by PCR NEGATIVE NEGATIVE Final    Comment: (NOTE) The Xpert Xpress SARS-CoV-2/FLU/RSV assay is intended as an aid in  the diagnosis of influenza from Nasopharyngeal swab specimens and  should not be used as a sole basis for treatment. Nasal washings and  aspirates are unacceptable for Xpert Xpress SARS-CoV-2/FLU/RSV  testing. Fact Sheet for Patients: PinkCheek.be Fact Sheet for Healthcare  Providers: GravelBags.it This test is not yet approved or cleared by the [place] FDA and  has been authorized for detection and/or diagnosis of SARS-CoV-2 by  FDA under an Emergency Use Authorization (EUA). This EUA will remain  in effect (meaning this test can be used) for the duration of the  Covid-19 declaration under Section 564(b)(1) of the Act, 21  U.S.C. section 360bbb-3(b)(1), unless the authorization is  terminated or revoked. Performed at [facility], [address] [place], [place] [address]          Radiology Studies: DG Chest 2 View  Result Date: 10/12/2019 CLINICAL DATA:  Chest pain EXAM: CHEST - 2 VIEW COMPARISON:  None. FINDINGS: Heart is normal size. Lungs are clear. No effusion or pneumothorax. Concern for left lateral rib fracture involving the left 6th rib. Recommend correlation for pain in this area. This is age indeterminate. IMPRESSION: No acute cardiopulmonary disease. Concern for left lateral 6th rib fracture, age indeterminate. Recommend correlation for pain in this area. Electronically Signed   By: Rolm Baptise M.D.   On: 10/12/2019 18:22   ECHOCARDIOGRAM COMPLETE  Result Date: 10/14/2019    ECHOCARDIOGRAM REPORT   Patient Name:   BARRET  Mau Date of Exam: 10/13/2019 Medical Rec #:  [id number]        Height:       76.0 in Accession #:    [id number]       Weight:       220.7 lb Date of Birth:  [birth year]       BSA:          2.31 m Patient Age:    69 years         BP:           124/78 mmHg Patient Gender: M                HR:           55 bpm. Exam Location:  [facility] Procedure: 2D Echo Indications:     NSTEMI I21.4  History:         Patient has no prior history of Echocardiogram examinations.                  Risk Factors:Dyslipidemia.  Sonographer:     Avanell Shackleton Referring Phys:  [place] Diagnosing Phys: [name] MD IMPRESSIONS  1. Left ventricular ejection fraction, by estimation, is 40 to 45%.  The left ventricle has mildly decreased function. The left ventricle demonstrates regional wall motion abnormalities (see scoring diagram/findings for description). Left ventricular diastolic parameters were normal.  2. Right ventricular systolic function is normal. The right ventricular size is normal.  3. The mitral valve is normal in structure and function. Mild mitral valve regurgitation.  4. The aortic valve is normal in structure and function. Aortic valve regurgitation is trivial. FINDINGS  Left Ventricle: Left ventricular ejection fraction, by estimation, is 40 to 45%. The left ventricle has mildly decreased function. The left ventricle demonstrates regional wall motion abnormalities. Mild hypokinesis of the left ventricular, apical apical segment. There is no left ventricular hypertrophy. Left ventricular diastolic parameters were normal.  LV Wall Scoring: The apical septal segment is hypokinetic. Right Ventricle: The right ventricular size is normal. No increase in right ventricular wall thickness. Right ventricular systolic function is normal. Left Atrium: Left atrial size was normal in size. Right Atrium: Right atrial size was normal in size. Pericardium: There is no evidence of pericardial effusion. Mitral Valve: The mitral valve is normal in structure and function. Mild mitral valve regurgitation. Tricuspid Valve: The tricuspid valve is normal in structure. Tricuspid valve regurgitation is trivial. Aortic Valve: The aortic valve is normal in structure and function. Aortic valve regurgitation is trivial. Pulmonic Valve: The pulmonic valve was grossly normal. Pulmonic valve regurgitation is not visualized. Aorta: The aortic root and ascending aorta are structurally normal, with no evidence of dilitation. IAS/Shunts: No atrial level shunt detected by color flow Doppler.  LEFT VENTRICLE PLAX 2D LVIDd:         5.98 cm  Diastology LVIDs:         4.24 cm  LV e' lateral:   6.96 cm/s LV PW:         0.89 cm  LV  E/e' lateral: 6.4 LV IVS:        0.85 cm  LV e' medial:    5.77 cm/s LVOT diam:     2.30 cm  LV E/e' medial:  7.7 LV SV Index:   42.23 LVOT Area:     4.15 cm  RIGHT VENTRICLE             IVC RV S prime:  13.70 cm/s  IVC diam: 2.71 cm LEFT ATRIUM             Index       RIGHT ATRIUM           Index LA diam:        4.20 cm 1.82 cm/m  RA Area:     21.40 cm LA Vol (A2C):   57.5 ml 24.89 ml/m RA Volume:   62.10 ml  26.88 ml/m LA Vol (A4C):   47.8 ml 20.69 ml/m LA Biplane Vol: 53.3 ml 23.07 ml/m   AORTA Ao Root diam: 3.30 cm MITRAL VALVE MV Area (PHT): 2.66 cm    SHUNTS MV Decel Time: 285 msec    Systemic Diam: 2.30 cm MV E velocity: 44.20 cm/s MV A velocity: 64.10 cm/s MV E/A ratio:  0.69 [name] MD Electronically signed by [name] MD Signature Date/Time: 10/14/2019/6:22:25 AM    Final         Scheduled Meds: . atorvastatin  40 mg Oral q1800  . metoprolol tartrate  12.5 mg Oral BID  . sodium chloride flush  3 mL Intravenous Q12H   Continuous Infusions: . sodium chloride 100 mL/hr at 10/14/19 0446  . heparin 1,300 Units/hr (10/12/19 1936)     LOS: 2 days    Time spent: 30 mins     Wyvonnia Dusky, MD [facility] Pager [phone]  If 7PM-7AM, please contact night-coverage [email or web address] 10/14/2019, 7:46 AM

## 2019-10-15 ENCOUNTER — Encounter
Admission: EM | Disposition: A | Discharge status: Home / Self Care | Payer: Self-pay | Source: Home / Self Care | Attending: Internal Medicine

## 2019-10-15 HISTORY — PX: CORONARY STENT INTERVENTION: CATH118234

## 2019-10-15 HISTORY — PX: LEFT HEART CATH AND CORONARY ANGIOGRAPHY: CATH118249

## 2019-10-15 LAB — CBC
HCT: 37.8 % — ABNORMAL LOW (ref 39.0–52.0)
Hemoglobin: 12.7 g/dL — ABNORMAL LOW (ref 13.0–17.0)
MCH: 30.5 pg (ref 26.0–34.0)
MCHC: 33.6 g/dL (ref 30.0–36.0)
MCV: 90.6 fL (ref 80.0–100.0)
Platelets: 136 10*3/uL — ABNORMAL LOW (ref 150–400)
RBC: 4.17 MIL/uL — ABNORMAL LOW (ref 4.22–5.81)
RDW: 12.9 % (ref 11.5–15.5)
WBC: 5.4 10*3/uL (ref 4.0–10.5)
nRBC: 0 % (ref 0.0–0.2)

## 2019-10-15 LAB — HEPARIN LEVEL (UNFRACTIONATED): Heparin Unfractionated: 0.39 IU/mL (ref 0.30–0.70)

## 2019-10-15 LAB — BASIC METABOLIC PANEL
Anion gap: 4 — ABNORMAL LOW (ref 5–15)
BUN: 11 mg/dL (ref 8–23)
CO2: 26 mmol/L (ref 22–32)
Calcium: 8.7 mg/dL — ABNORMAL LOW (ref 8.9–10.3)
Chloride: 109 mmol/L (ref 98–111)
Creatinine, Ser: 0.81 mg/dL (ref 0.61–1.24)
GFR calc Af Amer: >60 mL/min (ref >60)
GFR calc non Af Amer: >60 mL/min (ref >60)
Glucose, Bld: 100 mg/dL — ABNORMAL HIGH (ref 70–99)
Potassium: 4.3 mmol/L (ref 3.5–5.1)
Sodium: 139 mmol/L (ref 135–145)

## 2019-10-15 LAB — POCT ACTIVATED CLOTTING TIME: Activated Clotting Time: 351 seconds

## 2019-10-15 SURGERY — LEFT HEART CATH AND CORONARY ANGIOGRAPHY
Anesthesia: Moderate Sedation

## 2019-10-15 MED ORDER — SODIUM CHLORIDE 0.9 % IV SOLN
0.2500 mg/kg/h | INTRAVENOUS | Status: AC
  Filled 2019-10-15: qty 250

## 2019-10-15 MED ORDER — SODIUM CHLORIDE 0.9% FLUSH: 3.0000 mL | INTRAVENOUS | Status: DC | PRN

## 2019-10-15 MED ORDER — TICAGRELOR 90 MG PO TABS
ORAL_TABLET | ORAL | Status: DC | PRN
  Administered 2019-10-15: 180 mg via ORAL

## 2019-10-15 MED ORDER — MIDAZOLAM HCL 2 MG/2ML IJ SOLN
INTRAMUSCULAR | Status: DC | PRN
  Administered 2019-10-15: 1 mg via INTRAVENOUS

## 2019-10-15 MED ORDER — ASPIRIN 81 MG PO CHEW
CHEWABLE_TABLET | ORAL | Status: AC
  Administered 2019-10-15: 08:00:00 81 mg
  Filled 2019-10-15: qty 1

## 2019-10-15 MED ORDER — HEPARIN (PORCINE) IN NACL 1000-0.9 UT/500ML-% IV SOLN
INTRAVENOUS | Status: AC
  Filled 2019-10-15: qty 1000

## 2019-10-15 MED ORDER — VERAPAMIL HCL 2.5 MG/ML IV SOLN
INTRAVENOUS | Status: AC
  Filled 2019-10-15: qty 2

## 2019-10-15 MED ORDER — LABETALOL HCL 5 MG/ML IV SOLN: 10.0000 mg | INTRAVENOUS | Status: AC | PRN

## 2019-10-15 MED ORDER — SODIUM CHLORIDE 0.9 % WEIGHT BASED INFUSION: 3.0000 mL/kg/h | INTRAVENOUS | Status: DC

## 2019-10-15 MED ORDER — HEPARIN SODIUM (PORCINE) 1000 UNIT/ML IJ SOLN
INTRAMUSCULAR | Status: DC | PRN
  Administered 2019-10-15: 5000 [IU] via INTRAVENOUS

## 2019-10-15 MED ORDER — SODIUM CHLORIDE 0.9 % IV SOLN: 250.0000 mL | INTRAVENOUS | Status: DC | PRN

## 2019-10-15 MED ORDER — ASPIRIN 81 MG PO CHEW: 81.0000 mg | CHEWABLE_TABLET | ORAL | Status: DC

## 2019-10-15 MED ORDER — ONDANSETRON HCL 4 MG/2ML IJ SOLN: 4.0000 mg | Freq: Four times a day (QID) | INTRAMUSCULAR | Status: DC | PRN

## 2019-10-15 MED ORDER — TICAGRELOR 90 MG PO TABS
90.0000 mg | ORAL_TABLET | Freq: Two times a day (BID) | ORAL | Status: DC
  Administered 2019-10-15 – 2019-10-16 (×2): 90 mg via ORAL
  Filled 2019-10-15 (×2): qty 1

## 2019-10-15 MED ORDER — FENTANYL CITRATE (PF) 100 MCG/2ML IJ SOLN
INTRAMUSCULAR | Status: AC
  Filled 2019-10-15: qty 2

## 2019-10-15 MED ORDER — IOHEXOL 300 MG/ML  SOLN
INTRAMUSCULAR | Status: DC | PRN
  Administered 2019-10-15: 100 mL

## 2019-10-15 MED ORDER — SODIUM CHLORIDE 0.9% FLUSH
3.0000 mL | Freq: Two times a day (BID) | INTRAVENOUS | Status: DC
  Administered 2019-10-15: 3 mL via INTRAVENOUS

## 2019-10-15 MED ORDER — BIVALIRUDIN BOLUS VIA INFUSION - CUPID
INTRAVENOUS | Status: DC | PRN
  Administered 2019-10-15: 74.475 mg via INTRAVENOUS

## 2019-10-15 MED ORDER — ASPIRIN 81 MG PO CHEW
CHEWABLE_TABLET | ORAL | Status: AC
  Filled 2019-10-15: qty 3

## 2019-10-15 MED ORDER — SODIUM CHLORIDE 0.9 % IV SOLN
INTRAVENOUS | Status: AC | PRN
  Administered 2019-10-15: 1.75 mg/kg/h via INTRAVENOUS

## 2019-10-15 MED ORDER — ACETAMINOPHEN 325 MG PO TABS: 650.0000 mg | ORAL_TABLET | ORAL | Status: DC | PRN

## 2019-10-15 MED ORDER — FENTANYL CITRATE (PF) 100 MCG/2ML IJ SOLN
INTRAMUSCULAR | Status: DC | PRN
  Administered 2019-10-15: 50 ug via INTRAVENOUS

## 2019-10-15 MED ORDER — VERAPAMIL HCL 2.5 MG/ML IV SOLN
INTRAVENOUS | Status: DC | PRN
  Administered 2019-10-15: 2.5 mg via INTRACORONARY

## 2019-10-15 MED ORDER — SODIUM CHLORIDE 0.9 % WEIGHT BASED INFUSION: 1.0000 mL/kg/h | INTRAVENOUS | Status: DC

## 2019-10-15 MED ORDER — HEPARIN (PORCINE) IN NACL 1000-0.9 UT/500ML-% IV SOLN
INTRAVENOUS | Status: DC | PRN
  Administered 2019-10-15: 500 mL

## 2019-10-15 MED ORDER — ASPIRIN 81 MG PO CHEW
81.0000 mg | CHEWABLE_TABLET | Freq: Every day | ORAL | Status: DC
  Administered 2019-10-16: 81 mg via ORAL
  Filled 2019-10-15: qty 1

## 2019-10-15 MED ORDER — ASPIRIN 81 MG PO CHEW
CHEWABLE_TABLET | ORAL | Status: DC | PRN
  Administered 2019-10-15: 243 mg via ORAL

## 2019-10-15 MED ORDER — HYDRALAZINE HCL 20 MG/ML IJ SOLN: 10.0000 mg | INTRAMUSCULAR | Status: AC | PRN

## 2019-10-15 MED ORDER — SODIUM CHLORIDE 0.9 % WEIGHT BASED INFUSION
1.0000 mL/kg/h | INTRAVENOUS | Status: AC
  Administered 2019-10-15: 13:00:00 1 mL/kg/h via INTRAVENOUS

## 2019-10-15 MED ORDER — IOHEXOL 300 MG/ML  SOLN
INTRAMUSCULAR | Status: DC | PRN
  Administered 2019-10-15: 170 mL via INTRA_ARTERIAL

## 2019-10-15 MED ORDER — HEPARIN SODIUM (PORCINE) 1000 UNIT/ML IJ SOLN
INTRAMUSCULAR | Status: AC
  Filled 2019-10-15: qty 1

## 2019-10-15 MED ORDER — HEPARIN (PORCINE) IN NACL 2000-0.9 UNIT/L-% IV SOLN: INTRAVENOUS | Status: DC | PRN

## 2019-10-15 MED ORDER — ASPIRIN 81 MG PO CHEW: 81.0000 mg | CHEWABLE_TABLET | Freq: Once | ORAL | Status: AC

## 2019-10-15 MED ORDER — BIVALIRUDIN TRIFLUOROACETATE 250 MG IV SOLR
INTRAVENOUS | Status: AC
  Filled 2019-10-15: qty 250

## 2019-10-15 MED ORDER — VERAPAMIL HCL 2.5 MG/ML IV SOLN
INTRAVENOUS | Status: DC | PRN
  Administered 2019-10-15: 2.5 mg via INTRA_ARTERIAL

## 2019-10-15 MED ORDER — MIDAZOLAM HCL 2 MG/2ML IJ SOLN
INTRAMUSCULAR | Status: AC
  Filled 2019-10-15: qty 2

## 2019-10-15 MED ORDER — TICAGRELOR 90 MG PO TABS
ORAL_TABLET | ORAL | Status: AC
  Filled 2019-10-15: qty 2

## 2019-10-15 SURGICAL SUPPLY — 16 items
BALLN TREK RX 3.0X15 (BALLOONS) ×3
BALLN ~~LOC~~ EUPHORA RX 3.25X15 (BALLOONS) ×3
BALLOON TREK RX 3.0X15 (BALLOONS) ×1 IMPLANT
BALLOON ~~LOC~~ EUPHORA RX 3.25X15 (BALLOONS) ×1 IMPLANT
CATH INFINITI 5 FR JL3.5 (CATHETERS) ×3 IMPLANT
CATH INFINITI JR4 5F (CATHETERS) ×3 IMPLANT
CATH VISTA GUIDE 6FR XB3.5 (CATHETERS) ×3 IMPLANT
DEVICE INFLAT 30 PLUS (MISCELLANEOUS) ×3 IMPLANT
DEVICE RAD TR BAND REGULAR (VASCULAR PRODUCTS) ×3 IMPLANT
GLIDESHEATH SLEND SS 6F .021 (SHEATH) ×3 IMPLANT
KIT MANI 3VAL PERCEP (MISCELLANEOUS) ×3 IMPLANT
PACK CARDIAC CATH (CUSTOM PROCEDURE TRAY) ×3 IMPLANT
STENT RESOLUTE ONYX 3.0X26 (Permanent Stent) ×3 IMPLANT
WIRE G HI TQ BMW 190 (WIRE) ×3 IMPLANT
WIRE HITORQ VERSACORE ST 145CM (WIRE) ×3 IMPLANT
WIRE ROSEN-J .035X260CM (WIRE) ×3 IMPLANT

## 2019-10-15 NOTE — Progress Notes (Signed)
Pt back to 2A, right wrist site clean dry and intact, pt expressed no needs at this tim, educated pt on keeping wrist elevated.

## 2019-10-15 NOTE — Consult Note (Signed)
ANTICOAGULATION CONSULT NOTE   Pharmacy Consult for Heparin Indication: chest pain/ACS  No Known Allergies  Patient Measurements: Height: 6\' 4"  (193 cm) Weight: 218 lb 14.4 oz (99.3 kg) IBW/kg (Calculated) : 86.8 Heparin Dosing Weight: 95.3 kg  Vital Signs: Temp: 97.4 F (36.3 C) (02/15 0507) Temp Source: Oral (02/15 0507) BP: 110/72 (02/15 0507) Pulse Rate: 61 (02/15 0507)  Labs: Recent Labs    10/12/19 1803 10/12/19 1803 10/12/19 1918 10/12/19 2154 10/12/19 2350 10/13/19 0201 10/13/19 0201 10/13/19 1020 10/14/19 0555 10/14/19 0559 10/15/19 0506  HGB 15.1   < >  --   --   --  14.3   < >  --  12.8*  --  12.7*  HCT 45.7   < >  --   --   --  42.4  --   --  39.4  --  37.8*  PLT 190   < >  --   --   --  158  --   --  137*  --  136*  APTT 31  --   --   --   --   --   --   --   --   --   --   LABPROT 11.9  --   --   --   --   --   --   --   --   --   --   INR 0.9  --   --   --   --   --   --   --   --   --   --   HEPARINUNFRC  --   --   --   --   --  0.53   < > 0.54 0.53  --  0.39  CREATININE 1.03   < >  --   --   --   --   --  0.84  --  0.86 0.81  TROPONINIHS 1,204*   < > 1,585* 3,239* 4,698*  --   --   --   --   --   --    < > = values in this interval not displayed.    Estimated Creatinine Clearance: 107.2 mL/min (by C-G formula based on SCr of 0.81 mg/dL).   Medical History: Past Medical History:  Diagnosis Date  . Cataract   . Hepatitis   . Hyperlipidemia     Medications:  Medications Prior to Admission  Medication Sig Dispense Refill Last Dose  . meloxicam (MOBIC) 15 MG tablet Take 1 tablet by mouth daily as needed for pain.   1 10/11/2019 at prn   Scheduled:  . atorvastatin  40 mg Oral q1800  . metoprolol tartrate  12.5 mg Oral BID  . sodium chloride flush  3 mL Intravenous Q12H   Infusions:  . sodium chloride 100 mL/hr at 10/15/19 0236  . sodium chloride    . heparin 1,300 Units/hr (10/15/19 0346)   PRN:  Anti-infectives (From admission,  onward)   None      Assessment: Pharmacy consulted to start heparin for ACS. No DOAC noted PTA.   2/13 0201 HL 0.53  2/13 1020 HL 0.54  2/14 0555 HL 0.53  2/15 0506 HL 0.39  Goal of Therapy:  Heparin level 0.3-0.7 units/ml Monitor platelets by anticoagulation protocol: Yes   Plan:  2/15 HL 0.39. Heparin level remains therapeutic. Will continue current infusion rate of 1300 units.hr. Platelets trending down (Plts: 190>> 158>> 137>>136). Continue to monitor.  Will recheck HL and CBC with AM labs  Paulina Fusi, PharmD, BCPS 10/15/2019 5:50 AM

## 2019-10-15 NOTE — Progress Notes (Signed)
PROGRESS NOTE    Philip Mack  [id number] DOB: [birth year] DOA: 10/12/2019 PCP: Venia Carbon, MD       Assessment & Plan:   Principal Problem:   NSTEMI (non-ST elevated myocardial infarction) [facility]) Active Problems:   Hyperlipidemia   BPH with obstruction/lower urinary tract symptoms   NSTEMI: continue on metoprolol, aspirin. Nitro, morphine prn for pain. Continue on IV heparin drip. Continue on tele. Troponins are elevated. Echo shows EF 40-45%, LV shows regional wall motion abnormalities, diastolic function was normal. Cardiac cath today. Cardio following and recs apprec   Thrombocytopenia: etiology unclear. Will continue to monitor   HLD: will continue on atorvastatin  Hyperglycemia: no hx of DM. Will continue to monitor    DVT prophylaxis: heparin drip  Code Status: full  Family Communication:  Disposition Plan: when pt is cleared by cardio    Consultants:   Cardio    Procedures: cardiac cath today    Antimicrobials: n/a   Subjective: Pt c/o malaise  Objective: Vitals:   10/14/19 1639 10/14/19 1951 10/15/19 0507 10/15/19 0746  BP: 121/74 120/63 110/72 130/75  Pulse: (!) 49 (!) 50 61 (!) 58  Resp: 18  18 19   Temp: 97.7 F (36.5 C) 98.4 F (36.9 C) (!) 97.4 F (36.3 C) 98 F (36.7 C)  TempSrc: Oral Oral Oral Oral  SpO2: 99% 98% 94% 99%  Weight:   99.3 kg   Height:        Intake/Output Summary (Last 24 hours) at 10/15/2019 0806 Last data filed at 10/15/2019 0600 Gross per 24 hour  Intake 5089.85 ml  Output 3850 ml  Net 1239.85 ml   Filed Weights   10/12/19 2155 10/14/19 0423 10/15/19 0507  Weight: 100.1 kg 98.6 kg 99.3 kg    Examination:  General exam: Appears calm and comfortable  Respiratory system: Clear to auscultation. No rales, rhonchi Cardiovascular system: S1 & S2 +. No rubs, gallops or clicks.. Gastrointestinal system: Abdomen is nondistended, soft and nontender. Normal bowel sounds heard. Central nervous system:  Alert and oriented. Moves all 4 extremities  Psychiatry: Judgement and insight appear normal. Mood & affect appropriate.     Data Reviewed: I have personally reviewed following labs and imaging studies  CBC: Recent Labs  Lab 10/12/19 1803 10/13/19 0201 10/14/19 0555 10/15/19 0506  WBC 7.4 8.4 5.7 5.4  HGB 15.1 14.3 12.8* 12.7*  HCT 45.7 42.4 39.4 37.8*  MCV 90.7 90.8 91.4 90.6  PLT 190 158 137* [id number]*   Basic Metabolic Panel: Recent Labs  Lab 10/12/19 1803 10/13/19 1020 10/14/19 0559 10/15/19 0506  NA 139 138 139 139  K 4.1 4.0 4.2 4.3  CL 102 105 109 109  CO2 26 28 25 26   GLUCOSE 132* 116* 96 100*  BUN 18 12 10 11   CREATININE 1.03 0.84 0.86 0.81  CALCIUM 9.5 8.6* 8.5* 8.7*   GFR: Estimated Creatinine Clearance: 107.2 mL/min (by C-G formula based on SCr of 0.81 mg/dL). Liver Function Tests: No results for input(s): AST, ALT, ALKPHOS, BILITOT, PROT, ALBUMIN in the last 168 hours. No results for input(s): LIPASE, AMYLASE in the last 168 hours. No results for input(s): AMMONIA in the last 168 hours. Coagulation Profile: Recent Labs  Lab 10/12/19 1803  INR 0.9   Cardiac Enzymes: No results for input(s): CKTOTAL, CKMB, CKMBINDEX, TROPONINI in the last 168 hours. BNP (last 3 results) No results for input(s): PROBNP in the last 8760 hours. HbA1C: No results for input(s): HGBA1C in the last 72 hours.  CBG: No results for input(s): GLUCAP in the last 168 hours. Lipid Profile: Recent Labs    10/12/19 2154  CHOL 231*  HDL 67  LDLCALC 156*  TRIG 41  CHOLHDL 3.4   Thyroid Function Tests: No results for input(s): TSH, T4TOTAL, FREET4, T3FREE, THYROIDAB in the last 72 hours. Anemia Panel: No results for input(s): VITAMINB12, FOLATE, FERRITIN, TIBC, IRON, RETICCTPCT in the last 72 hours. Sepsis Labs: No results for input(s): PROCALCITON, LATICACIDVEN in the last 168 hours.  Recent Results (from the past 240 hour(s))  Respiratory Panel by RT PCR (Flu A&B, Covid)  - Nasopharyngeal Swab     Status: None   Collection Time: 10/12/19  7:05 PM   Specimen: Nasopharyngeal Swab  Result Value Ref Range Status   SARS Coronavirus 2 by RT PCR NEGATIVE NEGATIVE Final    Comment: (NOTE) SARS-CoV-2 target nucleic acids are NOT DETECTED. The SARS-CoV-2 RNA is generally detectable in upper respiratoy specimens during the acute phase of infection. The lowest concentration of SARS-CoV-2 viral copies this assay can detect is 131 copies/mL. A negative result does not preclude SARS-Cov-2 infection and should not be used as the sole basis for treatment or other patient management decisions. A negative result may occur with  improper specimen collection/handling, submission of specimen other than nasopharyngeal swab, presence of viral mutation(s) within the areas targeted by this assay, and inadequate number of viral copies (<131 copies/mL). A negative result must be combined with clinical observations, patient history, and epidemiological information. The expected result is Negative. Fact Sheet for Patients:  PinkCheek.be Fact Sheet for Healthcare Providers:  GravelBags.it This test is not yet ap proved or cleared by the [place] FDA and  has been authorized for detection and/or diagnosis of SARS-CoV-2 by FDA under an Emergency Use Authorization (EUA). This EUA will remain  in effect (meaning this test can be used) for the duration of the COVID-19 declaration under Section 564(b)(1) of the Act, 21 U.S.C. section 360bbb-3(b)(1), unless the authorization is terminated or revoked sooner.    Influenza A by PCR NEGATIVE NEGATIVE Final   Influenza B by PCR NEGATIVE NEGATIVE Final    Comment: (NOTE) The Xpert Xpress SARS-CoV-2/FLU/RSV assay is intended as an aid in  the diagnosis of influenza from Nasopharyngeal swab specimens and  should not be used as a sole basis for treatment. Nasal washings and    aspirates are unacceptable for Xpert Xpress SARS-CoV-2/FLU/RSV  testing. Fact Sheet for Patients: PinkCheek.be Fact Sheet for Healthcare Providers: GravelBags.it This test is not yet approved or cleared by the [place] FDA and  has been authorized for detection and/or diagnosis of SARS-CoV-2 by  FDA under an Emergency Use Authorization (EUA). This EUA will remain  in effect (meaning this test can be used) for the duration of the  Covid-19 declaration under Section 564(b)(1) of the Act, 21  U.S.C. section 360bbb-3(b)(1), unless the authorization is  terminated or revoked. Performed at [facility], [address] [place], [place] [address]          Radiology Studies: ECHOCARDIOGRAM COMPLETE  Result Date: 10/14/2019    ECHOCARDIOGRAM REPORT   Patient Name:   DONTERRIOUS REDUS Date of Exam: 10/13/2019 Medical Rec #:  [id number]        Height:       76.0 in Accession #:    [id number]       Weight:       220.7 lb Date of Birth:  05-18-1951  BSA:          2.31 m Patient Age:    69 years         BP:           124/78 mmHg Patient Gender: M                HR:           55 bpm. Exam Location:  [facility] Procedure: 2D Echo Indications:     NSTEMI I21.4  History:         Patient has no prior history of Echocardiogram examinations.                  Risk Factors:Dyslipidemia.  Sonographer:     Avanell Shackleton Referring Phys:  [place] Diagnosing Phys: [name] MD IMPRESSIONS  1. Left ventricular ejection fraction, by estimation, is 40 to 45%. The left ventricle has mildly decreased function. The left ventricle demonstrates regional wall motion abnormalities (see scoring diagram/findings for description). Left ventricular diastolic parameters were normal.  2. Right ventricular systolic function is normal. The right ventricular size is normal.  3. The mitral valve is normal in structure and function. Mild mitral valve  regurgitation.  4. The aortic valve is normal in structure and function. Aortic valve regurgitation is trivial. FINDINGS  Left Ventricle: Left ventricular ejection fraction, by estimation, is 40 to 45%. The left ventricle has mildly decreased function. The left ventricle demonstrates regional wall motion abnormalities. Mild hypokinesis of the left ventricular, apical apical segment. There is no left ventricular hypertrophy. Left ventricular diastolic parameters were normal.  LV Wall Scoring: The apical septal segment is hypokinetic. Right Ventricle: The right ventricular size is normal. No increase in right ventricular wall thickness. Right ventricular systolic function is normal. Left Atrium: Left atrial size was normal in size. Right Atrium: Right atrial size was normal in size. Pericardium: There is no evidence of pericardial effusion. Mitral Valve: The mitral valve is normal in structure and function. Mild mitral valve regurgitation. Tricuspid Valve: The tricuspid valve is normal in structure. Tricuspid valve regurgitation is trivial. Aortic Valve: The aortic valve is normal in structure and function. Aortic valve regurgitation is trivial. Pulmonic Valve: The pulmonic valve was grossly normal. Pulmonic valve regurgitation is not visualized. Aorta: The aortic root and ascending aorta are structurally normal, with no evidence of dilitation. IAS/Shunts: No atrial level shunt detected by color flow Doppler.  LEFT VENTRICLE PLAX 2D LVIDd:         5.98 cm  Diastology LVIDs:         4.24 cm  LV e' lateral:   6.96 cm/s LV PW:         0.89 cm  LV E/e' lateral: 6.4 LV IVS:        0.85 cm  LV e' medial:    5.77 cm/s LVOT diam:     2.30 cm  LV E/e' medial:  7.7 LV SV Index:   42.23 LVOT Area:     4.15 cm  RIGHT VENTRICLE             IVC RV S prime:     13.70 cm/s  IVC diam: 2.71 cm LEFT ATRIUM             Index       RIGHT ATRIUM           Index LA diam:        4.20 cm 1.82 cm/m  RA Area:  21.40 cm LA Vol (A2C):    57.5 ml 24.89 ml/m RA Volume:   62.10 ml  26.88 ml/m LA Vol (A4C):   47.8 ml 20.69 ml/m LA Biplane Vol: 53.3 ml 23.07 ml/m   AORTA Ao Root diam: 3.30 cm MITRAL VALVE MV Area (PHT): 2.66 cm    SHUNTS MV Decel Time: 285 msec    Systemic Diam: 2.30 cm MV E velocity: 44.20 cm/s MV A velocity: 64.10 cm/s MV E/A ratio:  0.69 [name] MD Electronically signed by [name] MD Signature Date/Time: 10/14/2019/6:22:25 AM    Final         Scheduled Meds:  atorvastatin  40 mg Oral q1800   metoprolol tartrate  12.5 mg Oral BID   sodium chloride flush  3 mL Intravenous Q12H   Continuous Infusions:  sodium chloride 100 mL/hr at 10/15/19 0236   sodium chloride     [START ON 10/16/2019] sodium chloride     Followed by   Derrill Memo ON 10/16/2019] sodium chloride     heparin 1,300 Units/hr (10/15/19 0346)     LOS: 3 days    Time spent: 30 mins     Wyvonnia Dusky, MD [facility] Pager [phone]  If 7PM-7AM, please contact night-coverage [email or web address] 10/15/2019, 8:06 AM

## 2019-10-15 NOTE — Care Management Important Message (Signed)
Important Message  Patient Details  Name: Philip Mack MRN: [id number] Date of Birth: [birth year]   Medicare Important Message Given:  Yes     Dannette Barbara 10/15/2019, 11:51 AM

## 2019-10-15 NOTE — Progress Notes (Signed)
Dr. Nehemiah Massed at bedside speaking with pt.& wife Re: cath/PCI results. Pt. & wife Verbalized understanding of conversation.

## 2019-10-15 NOTE — Progress Notes (Signed)
[facility] Encounter Note  Patient: Philip Mack / Admit Date: 10/12/2019 / Date of Encounter: 10/15/2019, 9:22 AM   Subjective: Patient feels much better today.  No evidence of chest discomfort or shortness of breath.  No evidence of congestive heart failure symptoms.  Troponin level elevated consistent with non-ST elevation myocardial infarction with EKG suggesting anterior wall distribution Echocardiogram shows apical hypokinesis with ejection fraction of 40% Cardiac catheterization showing continued anteroapical hypokinesis with ejection fraction of 40% 99% proximal left anterior descending artery as a culprit lesion causing non-ST elevation myocardial infarction Review of Systems: Positive for: None Negative for: Vision change, hearing change, syncope, dizziness, nausea, vomiting,diarrhea, bloody stool, stomach pain, cough, congestion, diaphoresis, urinary frequency, urinary pain,skin lesions, skin rashes Others previously listed  Objective: Telemetry: Normal sinus rhythm Physical Exam: Blood pressure 135/76, pulse 65, temperature 98 F (36.7 C), temperature source Oral, resp. rate 20, height 6\' 4"  (1.93 m), weight 99.3 kg, SpO2 98 %. Body mass index is 26.65 kg/m. General: Well developed, well nourished, in no acute distress. Head: Normocephalic, atraumatic, sclera non-icteric, no xanthomas, nares are without discharge. Neck: No apparent masses Lungs: Normal respirations with no wheezes, no rhonchi, no rales , no crackles   Heart: Regular rate and rhythm, normal S1 S2, no murmur, no rub, no gallop, PMI is normal size and placement, carotid upstroke normal without bruit, jugular venous pressure normal Abdomen: Soft, non-tender, non-distended with normoactive bowel sounds. No hepatosplenomegaly. Abdominal aorta is normal size without bruit Extremities: No edema, no clubbing, no cyanosis, no ulcers,  Peripheral: 2+ radial, 2+ femoral, 2+ dorsal pedal  pulses Neuro: Alert and oriented. Moves all extremities spontaneously. Psych:  Responds to questions appropriately with a normal affect.   Intake/Output Summary (Last 24 hours) at 10/15/2019 0922 Last data filed at 10/15/2019 0600 Gross per 24 hour  Intake 5089.85 ml  Output 3850 ml  Net 1239.85 ml    Inpatient Medications:  . [MAR Hold] atorvastatin  40 mg Oral q1800  . [MAR Hold] metoprolol tartrate  12.5 mg Oral BID  . [MAR Hold] sodium chloride flush  3 mL Intravenous Q12H   Infusions:  . sodium chloride 100 mL/hr at 10/15/19 0236  . sodium chloride    . [START ON 10/16/2019] sodium chloride 3 mL/kg/hr (10/15/19 0836)   Followed by  . [START ON 10/16/2019] sodium chloride    . heparin 1,300 Units/hr (10/15/19 0346)    Labs: Recent Labs    10/14/19 0559 10/15/19 0506  NA 139 139  K 4.2 4.3  CL 109 109  CO2 25 26  GLUCOSE 96 100*  BUN 10 11  CREATININE 0.86 0.81  CALCIUM 8.5* 8.7*   No results for input(s): AST, ALT, ALKPHOS, BILITOT, PROT, ALBUMIN in the last 72 hours. Recent Labs    10/14/19 0555 10/15/19 0506  WBC 5.7 5.4  HGB 12.8* 12.7*  HCT 39.4 37.8*  MCV 91.4 90.6  PLT 137* 136*   No results for input(s): CKTOTAL, CKMB, TROPONINI in the last 72 hours. Invalid input(s): POCBNP No results for input(s): HGBA1C in the last 72 hours.   Weights: Filed Weights   10/14/19 0423 10/15/19 0507 10/15/19 0842  Weight: 98.6 kg 99.3 kg 99.3 kg     Radiology/Studies:  DG Chest 2 View  Result Date: 10/12/2019 CLINICAL DATA:  Chest pain EXAM: CHEST - 2 VIEW COMPARISON:  None. FINDINGS: Heart is normal size. Lungs are clear. No effusion or pneumothorax. Concern for left lateral rib fracture involving the  left 6th rib. Recommend correlation for pain in this area. This is age indeterminate. IMPRESSION: No acute cardiopulmonary disease. Concern for left lateral 6th rib fracture, age indeterminate. Recommend correlation for pain in this area. Electronically Signed    By: Rolm Baptise M.D.   On: 10/12/2019 18:22   ECHOCARDIOGRAM COMPLETE  Result Date: 10/14/2019    ECHOCARDIOGRAM REPORT   Patient Name:   Philip Mack Date of Exam: 10/13/2019 Medical Rec #:  [id number]        Height:       76.0 in Accession #:    [id number]       Weight:       220.7 lb Date of Birth:  06/08/1951       BSA:          2.31 m Patient Age:    69 years         BP:           124/78 mmHg Patient Gender: M                HR:           55 bpm. Exam Location:  [facility] Procedure: 2D Echo Indications:     NSTEMI I21.4  History:         Patient has no prior history of Echocardiogram examinations.                  Risk Factors:Dyslipidemia.  Sonographer:     Avanell Shackleton Referring Phys:  [place] Diagnosing Phys: [name] MD IMPRESSIONS  1. Left ventricular ejection fraction, by estimation, is 40 to 45%. The left ventricle has mildly decreased function. The left ventricle demonstrates regional wall motion abnormalities (see scoring diagram/findings for description). Left ventricular diastolic parameters were normal.  2. Right ventricular systolic function is normal. The right ventricular size is normal.  3. The mitral valve is normal in structure and function. Mild mitral valve regurgitation.  4. The aortic valve is normal in structure and function. Aortic valve regurgitation is trivial. FINDINGS  Left Ventricle: Left ventricular ejection fraction, by estimation, is 40 to 45%. The left ventricle has mildly decreased function. The left ventricle demonstrates regional wall motion abnormalities. Mild hypokinesis of the left ventricular, apical apical segment. There is no left ventricular hypertrophy. Left ventricular diastolic parameters were normal.  LV Wall Scoring: The apical septal segment is hypokinetic. Right Ventricle: The right ventricular size is normal. No increase in right ventricular wall thickness. Right ventricular systolic function is normal. Left Atrium: Left atrial size was  normal in size. Right Atrium: Right atrial size was normal in size. Pericardium: There is no evidence of pericardial effusion. Mitral Valve: The mitral valve is normal in structure and function. Mild mitral valve regurgitation. Tricuspid Valve: The tricuspid valve is normal in structure. Tricuspid valve regurgitation is trivial. Aortic Valve: The aortic valve is normal in structure and function. Aortic valve regurgitation is trivial. Pulmonic Valve: The pulmonic valve was grossly normal. Pulmonic valve regurgitation is not visualized. Aorta: The aortic root and ascending aorta are structurally normal, with no evidence of dilitation. IAS/Shunts: No atrial level shunt detected by color flow Doppler.  LEFT VENTRICLE PLAX 2D LVIDd:         5.98 cm  Diastology LVIDs:         4.24 cm  LV e' lateral:   6.96 cm/s LV PW:         0.89 cm  LV E/e' lateral: 6.4  LV IVS:        0.85 cm  LV e' medial:    5.77 cm/s LVOT diam:     2.30 cm  LV E/e' medial:  7.7 LV SV Index:   42.23 LVOT Area:     4.15 cm  RIGHT VENTRICLE             IVC RV S prime:     13.70 cm/s  IVC diam: 2.71 cm LEFT ATRIUM             Index       RIGHT ATRIUM           Index LA diam:        4.20 cm 1.82 cm/m  RA Area:     21.40 cm LA Vol (A2C):   57.5 ml 24.89 ml/m RA Volume:   62.10 ml  26.88 ml/m LA Vol (A4C):   47.8 ml 20.69 ml/m LA Biplane Vol: 53.3 ml 23.07 ml/m   AORTA Ao Root diam: 3.30 cm MITRAL VALVE MV Area (PHT): 2.66 cm    SHUNTS MV Decel Time: 285 msec    Systemic Diam: 2.30 cm MV E velocity: 44.20 cm/s MV A velocity: 64.10 cm/s MV E/A ratio:  0.69 [name] MD Electronically signed by [name] MD Signature Date/Time: 10/14/2019/6:22:25 AM    Final      Assessment and Recommendation  69 y.o. 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  Continue heparin and aspirin for further risk reduction myocardial infarction 2.  Continue metoprolol watching closely for bradycardia for myocardial infarction 3.  High intensity cholesterol therapy 4.  Proceed to PCI and stent placement left anterior descending artery 5.  Cardiac rehabilitation Signed, [name] M.D. FACC

## 2019-10-16 ENCOUNTER — Encounter: Payer: Self-pay | Admitting: Cardiology

## 2019-10-16 ENCOUNTER — Telehealth: Payer: Self-pay

## 2019-10-16 LAB — BASIC METABOLIC PANEL
Anion gap: 10 (ref 5–15)
BUN: 11 mg/dL (ref 8–23)
CO2: 23 mmol/L (ref 22–32)
Calcium: 8.8 mg/dL — ABNORMAL LOW (ref 8.9–10.3)
Chloride: 106 mmol/L (ref 98–111)
Creatinine, Ser: 0.7 mg/dL (ref 0.61–1.24)
GFR calc Af Amer: >60 mL/min (ref >60)
GFR calc non Af Amer: >60 mL/min (ref >60)
Glucose, Bld: 89 mg/dL (ref 70–99)
Potassium: 3.9 mmol/L (ref 3.5–5.1)
Sodium: 139 mmol/L (ref 135–145)

## 2019-10-16 LAB — CBC
HCT: 39 % (ref 39.0–52.0)
Hemoglobin: 13 g/dL (ref 13.0–17.0)
MCH: 30 pg (ref 26.0–34.0)
MCHC: 33.3 g/dL (ref 30.0–36.0)
MCV: 89.9 fL (ref 80.0–100.0)
Platelets: 147 10*3/uL — ABNORMAL LOW (ref 150–400)
RBC: 4.34 MIL/uL (ref 4.22–5.81)
RDW: 12.8 % (ref 11.5–15.5)
WBC: 6.7 10*3/uL (ref 4.0–10.5)
nRBC: 0 % (ref 0.0–0.2)

## 2019-10-16 MED ORDER — ATORVASTATIN CALCIUM 40 MG PO TABS: 40.0000 mg | ORAL_TABLET | Freq: Every day | ORAL | 0 refills | Status: DC

## 2019-10-16 MED ORDER — ASPIRIN 81 MG PO CHEW: 81.0000 mg | CHEWABLE_TABLET | Freq: Every day | ORAL | Status: AC

## 2019-10-16 MED ORDER — TICAGRELOR 90 MG PO TABS: 90.0000 mg | ORAL_TABLET | Freq: Two times a day (BID) | ORAL | 0 refills | Status: DC

## 2019-10-16 MED ORDER — METOPROLOL TARTRATE 25 MG PO TABS: 12.5000 mg | ORAL_TABLET | Freq: Two times a day (BID) | ORAL | 0 refills | Status: DC

## 2019-10-16 NOTE — Progress Notes (Signed)
[facility] Encounter Note  Patient: Philip Mack / Admit Date: 10/12/2019 / Date of Encounter: 10/16/2019, 8:40 AM   Subjective: Patient feels much better today.  No evidence of chest discomfort or shortness of breath.  No evidence of congestive heart failure symptoms.  Troponin level elevated consistent with non-ST elevation myocardial infarction with EKG suggesting anterior wall distribution Echocardiogram shows apical hypokinesis with ejection fraction of 40% Cardiac catheterization showing continued anteroapical hypokinesis with ejection fraction of 40% 99% proximal left anterior descending artery as a culprit lesion causing non-ST elevation myocardial infarction Successful PCI and stent placement percent of left anterior descending artery without complication Review of Systems: Positive for: None Negative for: Vision change, hearing change, syncope, dizziness, nausea, vomiting,diarrhea, bloody stool, stomach pain, cough, congestion, diaphoresis, urinary frequency, urinary pain,skin lesions, skin rashes Others previously listed  Objective: Telemetry: Normal sinus rhythm Physical Exam: Blood pressure 124/79, pulse (!) 57, temperature 97.8 F (36.6 C), temperature source Oral, resp. rate 16, height 6\' 4"  (1.93 m), weight 99.3 kg, SpO2 99 %. Body mass index is 26.65 kg/m. General: Well developed, well nourished, in no acute distress. Head: Normocephalic, atraumatic, sclera non-icteric, no xanthomas, nares are without discharge. Neck: No apparent masses Lungs: Normal respirations with no wheezes, no rhonchi, no rales , no crackles   Heart: Regular rate and rhythm, normal S1 S2, no murmur, no rub, no gallop, PMI is normal size and placement, carotid upstroke normal without bruit, jugular venous pressure normal Abdomen: Soft, non-tender, non-distended with normoactive bowel sounds. No hepatosplenomegaly. Abdominal aorta is normal size without bruit Extremities: No  edema, no clubbing, no cyanosis, no ulcers,  Peripheral: 2+ radial, 2+ femoral, 2+ dorsal pedal pulses Neuro: Alert and oriented. Moves all extremities spontaneously. Psych:  Responds to questions appropriately with a normal affect.   Intake/Output Summary (Last 24 hours) at 10/16/2019 0840 Last data filed at 10/16/2019 0452 Gross per 24 hour  Intake 1977.05 ml  Output 3975 ml  Net -1997.95 ml    Inpatient Medications:  . aspirin  81 mg Oral Daily  . atorvastatin  40 mg Oral q1800  . metoprolol tartrate  12.5 mg Oral BID  . sodium chloride flush  3 mL Intravenous Q12H  . sodium chloride flush  3 mL Intravenous Q12H  . ticagrelor  90 mg Oral BID   Infusions:  . sodium chloride Stopped (10/15/19 0832)  . sodium chloride      Labs: Recent Labs    10/15/19 0506 10/16/19 0515  NA 139 139  K 4.3 3.9  CL 109 106  CO2 26 23  GLUCOSE 100* 89  BUN 11 11  CREATININE 0.81 0.70  CALCIUM 8.7* 8.8*   No results for input(s): AST, ALT, ALKPHOS, BILITOT, PROT, ALBUMIN in the last 72 hours. Recent Labs    10/15/19 0506 10/16/19 0515  WBC 5.4 6.7  HGB 12.7* 13.0  HCT 37.8* 39.0  MCV 90.6 89.9  PLT 136* 147*   No results for input(s): CKTOTAL, CKMB, TROPONINI in the last 72 hours. Invalid input(s): POCBNP No results for input(s): HGBA1C in the last 72 hours.   Weights: Filed Weights   10/14/19 0423 10/15/19 0507 10/15/19 0842  Weight: 98.6 kg 99.3 kg 99.3 kg     Radiology/Studies:  DG Chest 2 View  Result Date: 10/12/2019 CLINICAL DATA:  Chest pain EXAM: CHEST - 2 VIEW COMPARISON:  None. FINDINGS: Heart is normal size. Lungs are clear. No effusion or pneumothorax. Concern for left lateral rib fracture involving  the left 6th rib. Recommend correlation for pain in this area. This is age indeterminate. IMPRESSION: No acute cardiopulmonary disease. Concern for left lateral 6th rib fracture, age indeterminate. Recommend correlation for pain in this area. Electronically Signed    By: Rolm Baptise M.D.   On: 10/12/2019 18:22   CARDIAC CATHETERIZATION  Result Date: 10/15/2019  Prox LAD to Mid LAD lesion is 99% stenosed.  Mid LAD lesion is 45% stenosed.  Ramus lesion is 70% stenosed.  Mid RCA lesion is 50% stenosed.  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 and ramus artery Plan PCI and stent placement left anterior descending artery High intensity cholesterol therapy Dual antiplatelet therapy Cardiac rehabilitation Beta-blocker and ACE inhibitor as able depending on ability for myocardial infarction   ECHOCARDIOGRAM COMPLETE  Result Date: 10/14/2019    ECHOCARDIOGRAM REPORT   Patient Name:   Philip Mack Date of Exam: 10/13/2019 Medical Rec #:  [id number]        Height:       76.0 in Accession #:    [id number]       Weight:       220.7 lb Date of Birth:  1951-02-13       BSA:          2.31 m Patient Age:    69 years         BP:           124/78 mmHg Patient Gender: M                HR:           55 bpm. Exam Location:  [facility] Procedure: 2D Echo Indications:     NSTEMI I21.4  History:         Patient has no prior history of Echocardiogram examinations.                  Risk Factors:Dyslipidemia.  Sonographer:     Avanell Shackleton Referring Phys:  [place] Diagnosing Phys: [name] MD IMPRESSIONS  1. Left ventricular ejection fraction, by estimation, is 40 to 45%. The left ventricle has mildly decreased function. The left ventricle demonstrates regional wall motion abnormalities (see scoring diagram/findings for description). Left ventricular diastolic parameters were normal.  2. Right ventricular systolic function is normal. The right ventricular size is normal.  3. The mitral valve is normal in structure and function. Mild  mitral valve regurgitation.  4. The aortic valve is normal in structure and function. Aortic valve regurgitation is trivial. FINDINGS  Left Ventricle: Left ventricular ejection fraction, by estimation, is 40 to 45%. The left ventricle has mildly decreased function. The left ventricle demonstrates regional wall motion abnormalities. Mild hypokinesis of the left ventricular, apical apical segment. There is no left ventricular hypertrophy. Left ventricular diastolic parameters were normal.  LV Wall Scoring: The apical septal segment is hypokinetic. Right Ventricle: The right ventricular size is normal. No increase in right ventricular wall thickness. Right ventricular systolic function is normal. Left Atrium: Left atrial size was normal in size. Right Atrium: Right atrial size was normal in size. Pericardium: There is no evidence of pericardial effusion. Mitral Valve: The mitral valve is normal in structure and function. Mild mitral valve regurgitation. Tricuspid Valve: The tricuspid valve is normal in structure. Tricuspid valve regurgitation is trivial. Aortic Valve:  The aortic valve is normal in structure and function. Aortic valve regurgitation is trivial. Pulmonic Valve: The pulmonic valve was grossly normal. Pulmonic valve regurgitation is not visualized. Aorta: The aortic root and ascending aorta are structurally normal, with no evidence of dilitation. IAS/Shunts: No atrial level shunt detected by color flow Doppler.  LEFT VENTRICLE PLAX 2D LVIDd:         5.98 cm  Diastology LVIDs:         4.24 cm  LV e' lateral:   6.96 cm/s LV PW:         0.89 cm  LV E/e' lateral: 6.4 LV IVS:        0.85 cm  LV e' medial:    5.77 cm/s LVOT diam:     2.30 cm  LV E/e' medial:  7.7 LV SV Index:   42.23 LVOT Area:     4.15 cm  RIGHT VENTRICLE             IVC RV S prime:     13.70 cm/s  IVC diam: 2.71 cm LEFT ATRIUM             Index       RIGHT ATRIUM           Index LA diam:        4.20 cm 1.82 cm/m  RA Area:     21.40 cm LA Vol  (A2C):   57.5 ml 24.89 ml/m RA Volume:   62.10 ml  26.88 ml/m LA Vol (A4C):   47.8 ml 20.69 ml/m LA Biplane Vol: 53.3 ml 23.07 ml/m   AORTA Ao Root diam: 3.30 cm MITRAL VALVE MV Area (PHT): 2.66 cm    SHUNTS MV Decel Time: 285 msec    Systemic Diam: 2.30 cm MV E velocity: 44.20 cm/s MV A velocity: 64.10 cm/s MV E/A ratio:  0.69 [name] MD Electronically signed by [name] MD Signature Date/Time: 10/14/2019/6:22:25 AM    Final      Assessment and Recommendation  69 y.o. male with hyperlipidemia hypertension having acute anterior apical myocardial infarction without evidence of congestive heart failure Cardiac catheterization showing critical proximal left anterior descending artery atherosclerosis of 99 % now with successful PCI and stent placement without complication 1.  Dual antiplatelet therapy including Brilinta and aspirin for 1 year 2.  Continue metoprolol watching closely for bradycardia for myocardial infarction 3.  High intensity cholesterol therapy with atorvastatin 4.  No further cardiac diagnostics necessary at this time 5.  Cardiac rehabilitation 6.  Begin ambulation today and if ambulating well without evidence of cervical further significant symptoms okay for discharged home from cardiac standpoint with follow-up in the next 1 to 2 weeks Signed, [name] M.D. FACC

## 2019-10-16 NOTE — Discharge Summary (Signed)
Physician Discharge Summary  Philip Mack [id number] DOB: 1951-05-21 DOA: 10/12/2019  PCP: Venia Carbon, MD  Admit date: 10/12/2019 Discharge date: 10/16/2019  Admitted From: home Disposition:  home  Recommendations for Outpatient Follow-up:  1. Follow up with PCP in 1-2 weeks 2. Cardio 11/09/19 1:30pm  Home Health: no Equipment/Devices:  Discharge Condition: stable CODE STATUS: full  Diet recommendation: Heart Healthy  Brief/Interim Summary: HPI was taken from Dr. Jonelle Sidle: Philip Mack is a 69 y.o. male with medical history significant of hyperlipidemia, gastric and hepatitis C with no prior cardiac history who presented with substernal chest pain that started today. Pain was rated as 6 out of 10. Was heavy on his chest. Worsened with activities not relieved by anything. It radiates to his left shoulder and upper arm. Patient has noted recurrent chest pain whenever he was doing his yard work. This is however the most intense. His chest pain is now resolved with treatment in the ER including nitroglycerin and morphine. He is otherwise stable. Denied any strong family history of coronary artery disease. While in the ER EKG was noted to be normal however he has markedly elevated troponins. At this point non-ST elevation MI suspected. Patient being admitted to the hospital for evaluation and treatment..  ED Course: CBC and chemistry literally within normal. Glucose is 132. Troponin initially 1204, second 1585 and third 3039. Lipid panel showed an LDL of 156 and HDL of 67. Chest x-ray showed no acute findings. EKG showed normal sinus rhythm with mild ST depression in the lateral leads.  Hospital Course from Dr. Lenise Herald 2/13-2/16/21: Pt was found to have a NSTEMI so cardio was consulted. Cardic cath was done and showed 99% stenosis of LAD & a stent a placed. Pt was d/c home w/ aspirin, brilinta, metoprolol, atorvastatin as per cardio. Cardiac rehab was ordered by cardio. Pt  did not require therapy as pt was ambulating and transferring independently.     Discharge Diagnoses:  Principal Problem:   NSTEMI (non-ST elevated myocardial infarction) ([place]) Active Problems:   Hyperlipidemia   BPH with obstruction/lower urinary tract symptoms   NSTEMI: continue on metoprolol, aspirin, atorvastatin, & brilinta. Nitro, morphine prn for pain. D/C IV heparin drip. Continue on tele. Troponins are elevated. Echo shows EF 40-45%, LV shows regional wall motion abnormalities, diastolic function was normal. S/p cardiac cath 10/15/19 w/ stent to LAD. Cardio following and recs apprec   Thrombocytopenia: etiology unclear. Will continue to monitor   HLD: will continue on atorvastatin  Hyperglycemia: no hx of DM. Will continue to monitor    Discharge Instructions  Discharge Instructions    AMB Referral to Cardiac Rehabilitation - Phase II   Complete by: As directed    Diagnosis:  Coronary Stents NSTEMI     After initial evaluation and assessments completed: Virtual Based Care may be provided alone or in conjunction with Phase 2 Cardiac Rehab based on patient barriers.: Yes   Diet - low sodium heart healthy   Complete by: As directed    Discharge instructions   Complete by: As directed    F/U PCP in 1-2 weeks; F/U cardio in 1-2 weeks   Increase activity slowly   Complete by: As directed      Allergies as of 10/16/2019   No Known Allergies     Medication List    STOP taking these medications   meloxicam 15 MG tablet Commonly known as: MOBIC     TAKE these medications   aspirin 81 MG  chewable tablet Chew 1 tablet (81 mg total) by mouth daily.   atorvastatin 40 MG tablet Commonly known as: LIPITOR Take 1 tablet (40 mg total) by mouth daily at 6 PM.   metoprolol tartrate 25 MG tablet Commonly known as: LOPRESSOR Take 0.5 tablets (12.5 mg total) by mouth 2 (two) times daily.   ticagrelor 90 MG Tabs tablet Commonly known as: BRILINTA Take 1 tablet (90 mg  total) by mouth 2 (two) times daily.       No Known Allergies  Consultations:  Cardio, Dr. Nehemiah Massed   Procedures/Studies: DG Chest 2 View  Result Date: 10/12/2019 CLINICAL DATA:  Chest pain EXAM: CHEST - 2 VIEW COMPARISON:  None. FINDINGS: Heart is normal size. Lungs are clear. No effusion or pneumothorax. Concern for left lateral rib fracture involving the left 6th rib. Recommend correlation for pain in this area. This is age indeterminate. IMPRESSION: No acute cardiopulmonary disease. Concern for left lateral 6th rib fracture, age indeterminate. Recommend correlation for pain in this area. Electronically Signed   By: Rolm Baptise M.D.   On: 10/12/2019 18:22   CARDIAC CATHETERIZATION  Result Date: 10/15/2019  Prox LAD to Mid LAD lesion is 99% stenosed.  Mid LAD lesion is 45% stenosed.  Ramus lesion is 70% stenosed.  Mid RCA lesion is 50% stenosed.  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 and ramus artery Plan PCI and stent placement left anterior descending artery High intensity cholesterol therapy Dual antiplatelet therapy Cardiac rehabilitation Beta-blocker and ACE inhibitor as able depending on ability for myocardial infarction   ECHOCARDIOGRAM COMPLETE  Result Date: 10/14/2019    ECHOCARDIOGRAM REPORT   Patient Name:   Philip Mack Date of Exam: 10/13/2019 Medical Rec #:  [id number]        Height:       76.0 in Accession #:    [id number]       Weight:       220.7 lb Date of Birth:  [birth year]       BSA:          2.31 m Patient Age:    49 years         BP:           124/78 mmHg Patient Gender: M                HR:           55 bpm. Exam Location:  [facility] Procedure: 2D Echo Indications:     NSTEMI I21.4  History:         Patient has no prior  history of Echocardiogram examinations.                  Risk Factors:Dyslipidemia.  Sonographer:     Avanell Shackleton Referring Phys:  Ferris Diagnosing Phys: [name] MD IMPRESSIONS  1. Left ventricular ejection fraction, by estimation, is 40 to 45%. The left ventricle has mildly decreased function. The left ventricle demonstrates regional wall motion abnormalities (see scoring diagram/findings for description). Left ventricular diastolic parameters were normal.  2. Right ventricular systolic function is normal. The right ventricular size is normal.  3. The mitral valve is normal in structure and function. Mild mitral valve regurgitation.  4. The aortic valve is normal in structure and function. Aortic valve regurgitation is trivial. FINDINGS  Left Ventricle: Left ventricular ejection fraction, by estimation, is 40 to 45%. The left ventricle has mildly decreased function. The left ventricle demonstrates regional wall motion abnormalities. Mild hypokinesis of the left ventricular, apical apical segment. There is no left ventricular hypertrophy. Left ventricular diastolic parameters were normal.  LV Wall Scoring: The apical septal segment is hypokinetic. Right Ventricle: The right ventricular size is normal. No increase in right ventricular wall thickness. Right ventricular systolic function is normal. Left Atrium: Left atrial size was normal in size. Right Atrium: Right atrial size was normal in size. Pericardium: There is no evidence of pericardial effusion. Mitral Valve: The mitral valve is normal in structure and function. Mild mitral valve regurgitation. Tricuspid Valve: The tricuspid valve is normal in structure. Tricuspid valve regurgitation is trivial. Aortic Valve: The aortic valve is normal in structure and function. Aortic valve regurgitation is trivial. Pulmonic Valve: The pulmonic valve was grossly normal. Pulmonic valve regurgitation is not visualized. Aorta: The aortic root and  ascending aorta are structurally normal, with no evidence of dilitation. IAS/Shunts: No atrial level shunt detected by color flow Doppler.  LEFT VENTRICLE PLAX 2D LVIDd:         5.98 cm  Diastology LVIDs:         4.24 cm  LV e' lateral:   6.96 cm/s LV PW:         0.89 cm  LV E/e' lateral: 6.4 LV IVS:        0.85 cm  LV e' medial:    5.77 cm/s LVOT diam:     2.30 cm  LV E/e' medial:  7.7 LV SV Index:   42.23 LVOT Area:     4.15 cm  RIGHT VENTRICLE             IVC RV S prime:     13.70 cm/s  IVC diam: 2.71 cm LEFT ATRIUM             Index       RIGHT ATRIUM           Index LA diam:        4.20 cm 1.82 cm/m  RA Area:     21.40 cm LA Vol (A2C):   57.5 ml 24.89 ml/m RA Volume:   62.10 ml  26.88 ml/m LA Vol (A4C):   47.8 ml 20.69 ml/m LA Biplane Vol: 53.3 ml 23.07 ml/m   AORTA Ao Root diam: 3.30 cm MITRAL VALVE MV Area (PHT): 2.66 cm    SHUNTS MV Decel Time: 285 msec    Systemic Diam: 2.30 cm MV E velocity: 44.20 cm/s MV A velocity: 64.10 cm/s MV E/A ratio:  0.69 [name] MD Electronically signed by [name] MD Signature Date/Time: 10/14/2019/6:22:25 AM    Final        Subjective: Pt c/o malaise    Discharge Exam: Vitals:   10/16/19 0504 10/16/19 0745  BP:  124/79  Pulse: (!) 54 (!) 57  Resp:  16  Temp:  97.8 F (36.6 C)  SpO2:  99%   Vitals:   10/15/19 1923 10/16/19 0449 10/16/19 0504 10/16/19 0745  BP: 113/66 121/70  124/79  Pulse: 61 (!) 111 (!) 54 (!) 57  Resp:    16  Temp: 97.8 F (36.6 C) 98.5 F (36.9 C)  97.8 F (36.6 C)  TempSrc: Oral Oral  Oral  SpO2: 97% 96%  99%  Weight:      Height:        General:  Pt is alert, awake, not in acute distress Cardiovascular:  S1/S2 +, no rubs, no gallops Respiratory: CTA bilaterally, no wheezing, no rhonchi Abdominal: Soft, NT, ND, bowel sounds + Extremities: no edema, no cyanosis    The results of significant diagnostics from this hospitalization (including imaging, microbiology, ancillary and laboratory) are listed  below for reference.     Microbiology: Recent Results (from the past 240 hour(s))  Respiratory Panel by RT PCR (Flu A&B, Covid) - Nasopharyngeal Swab     Status: None   Collection Time: 10/12/19  7:05 PM   Specimen: Nasopharyngeal Swab  Result Value Ref Range Status   SARS Coronavirus 2 by RT PCR NEGATIVE NEGATIVE Final    Comment: (NOTE) SARS-CoV-2 target nucleic acids are NOT DETECTED. The SARS-CoV-2 RNA is generally detectable in upper respiratoy specimens during the acute phase of infection. The lowest concentration of SARS-CoV-2 viral copies this assay can detect is 131 copies/mL. A negative result does not preclude SARS-Cov-2 infection and should not be used as the sole basis for treatment or other patient management decisions. A negative result may occur with  improper specimen collection/handling, submission of specimen other than nasopharyngeal swab, presence of viral mutation(s) within the areas targeted by this assay, and inadequate number of viral copies (<131 copies/mL). A negative result must be combined with clinical observations, patient history, and epidemiological information. The expected result is Negative. Fact Sheet for Patients:  PinkCheek.be Fact Sheet for Healthcare Providers:  GravelBags.it This test is not yet ap proved or cleared by the [place] FDA and  has been authorized for detection and/or diagnosis of SARS-CoV-2 by FDA under an Emergency Use Authorization (EUA). This EUA will remain  in effect (meaning this test can be used) for the duration of the COVID-19 declaration under Section 564(b)(1) of the Act, 21 U.S.C. section 360bbb-3(b)(1), unless the authorization is terminated or revoked sooner.    Influenza A by PCR NEGATIVE NEGATIVE Final   Influenza B by PCR NEGATIVE NEGATIVE Final    Comment: (NOTE) The Xpert Xpress SARS-CoV-2/FLU/RSV assay is intended as an aid in  the diagnosis  of influenza from Nasopharyngeal swab specimens and  should not be used as a sole basis for treatment. Nasal washings and  aspirates are unacceptable for Xpert Xpress SARS-CoV-2/FLU/RSV  testing. Fact Sheet for Patients: PinkCheek.be Fact Sheet for Healthcare Providers: GravelBags.it This test is not yet approved or cleared by the [place] FDA and  has been authorized for detection and/or diagnosis of SARS-CoV-2 by  FDA under an Emergency Use Authorization (EUA). This EUA will remain  in effect (meaning this test can be used) for the duration of the  Covid-19 declaration under Section 564(b)(1) of the Act, 21  U.S.C. section 360bbb-3(b)(1), unless the authorization is  terminated or revoked. Performed at [facility], [address] [place], [place] [address]      Labs: BNP (last 3 results) No results for input(s): BNP in the last 8760 hours. Basic Metabolic Panel: Recent Labs  Lab 10/12/19 1803 10/13/19 1020 10/14/19 0559 10/15/19 0506 10/16/19 0515  NA 139 138 139 139 139  K 4.1 4.0 4.2 4.3 3.9  CL 102 105 109 109 106  CO2 26 28 25 26 23   GLUCOSE 132* 116* 96 100* 89  BUN 18 12 10 11 11   CREATININE 1.03 0.84 0.86 0.81 0.70  CALCIUM 9.5 8.6* 8.5* 8.7* 8.8*   Liver Function Tests: No results for input(s): AST, ALT, ALKPHOS, BILITOT, PROT, ALBUMIN in the last  168 hours. No results for input(s): LIPASE, AMYLASE in the last 168 hours. No results for input(s): AMMONIA in the last 168 hours. CBC: Recent Labs  Lab 10/12/19 1803 10/13/19 0201 10/14/19 0555 10/15/19 0506 10/16/19 0515  WBC 7.4 8.4 5.7 5.4 6.7  HGB 15.1 14.3 12.8* 12.7* 13.0  HCT 45.7 42.4 39.4 37.8* 39.0  MCV 90.7 90.8 91.4 90.6 89.9  PLT 190 158 137* 136* 147*   Cardiac Enzymes: No results for input(s): CKTOTAL, CKMB, CKMBINDEX, TROPONINI in the last 168 hours. BNP: Invalid input(s): POCBNP CBG: No results for input(s):  GLUCAP in the last 168 hours. D-Dimer No results for input(s): DDIMER in the last 72 hours. Hgb A1c No results for input(s): HGBA1C in the last 72 hours. Lipid Profile No results for input(s): CHOL, HDL, LDLCALC, TRIG, CHOLHDL, LDLDIRECT in the last 72 hours. Thyroid function studies No results for input(s): TSH, T4TOTAL, T3FREE, THYROIDAB in the last 72 hours.  Invalid input(s): FREET3 Anemia work up No results for input(s): VITAMINB12, FOLATE, FERRITIN, TIBC, IRON, RETICCTPCT in the last 72 hours. Urinalysis    Component Value Date/Time   COLORURINE straw 09/02/2008 1127   APPEARANCEUR Hazy 09/02/2008 1127   LABSPEC 1.020 09/02/2008 1127   PHURINE 5.0 09/02/2008 1127   HGBUR negative 09/02/2008 1127   BILIRUBINUR negative 09/02/2008 1127   UROBILINOGEN 0.2 09/02/2008 1127   NITRITE negative 09/02/2008 1127   Sepsis Labs Invalid input(s): PROCALCITONIN,  WBC,  LACTICIDVEN Microbiology Recent Results (from the past 240 hour(s))  Respiratory Panel by RT PCR (Flu A&B, Covid) - Nasopharyngeal Swab     Status: None   Collection Time: 10/12/19  7:05 PM   Specimen: Nasopharyngeal Swab  Result Value Ref Range Status   SARS Coronavirus 2 by RT PCR NEGATIVE NEGATIVE Final    Comment: (NOTE) SARS-CoV-2 target nucleic acids are NOT DETECTED. The SARS-CoV-2 RNA is generally detectable in upper respiratoy specimens during the acute phase of infection. The lowest concentration of SARS-CoV-2 viral copies this assay can detect is 131 copies/mL. A negative result does not preclude SARS-Cov-2 infection and should not be used as the sole basis for treatment or other patient management decisions. A negative result may occur with  improper specimen collection/handling, submission of specimen other than nasopharyngeal swab, presence of viral mutation(s) within the areas targeted by this assay, and inadequate number of viral copies (<131 copies/mL). A negative result must be combined with  clinical observations, patient history, and epidemiological information. The expected result is Negative. Fact Sheet for Patients:  PinkCheek.be Fact Sheet for Healthcare Providers:  GravelBags.it This test is not yet ap proved or cleared by the [place] FDA and  has been authorized for detection and/or diagnosis of SARS-CoV-2 by FDA under an Emergency Use Authorization (EUA). This EUA will remain  in effect (meaning this test can be used) for the duration of the COVID-19 declaration under Section 564(b)(1) of the Act, 21 U.S.C. section 360bbb-3(b)(1), unless the authorization is terminated or revoked sooner.    Influenza A by PCR NEGATIVE NEGATIVE Final   Influenza B by PCR NEGATIVE NEGATIVE Final    Comment: (NOTE) The Xpert Xpress SARS-CoV-2/FLU/RSV assay is intended as an aid in  the diagnosis of influenza from Nasopharyngeal swab specimens and  should not be used as a sole basis for treatment. Nasal washings and  aspirates are unacceptable for Xpert Xpress SARS-CoV-2/FLU/RSV  testing. Fact Sheet for Patients: PinkCheek.be Fact Sheet for Healthcare Providers: GravelBags.it This test is not yet approved or cleared  by the [place] and  has been authorized for detection and/or diagnosis of SARS-CoV-2 by  FDA under an Emergency Use Authorization (EUA). This EUA will remain  in effect (meaning this test can be used) for the duration of the  Covid-19 declaration under Section 564(b)(1) of the Act, 21  U.S.C. section 360bbb-3(b)(1), unless the authorization is  terminated or revoked. Performed at [facility], [address] [place], [place] [address]      Time coordinating discharge: Over 30 minutes  SIGNED:   Wyvonnia Dusky, MD  [facility] 10/16/2019, 9:42 AM Pager   If 7PM-7AM, please contact  night-coverage [email or web address]

## 2019-10-16 NOTE — Telephone Encounter (Signed)
Transition Care Management Follow-up Telephone Call  Date of discharge and from where: 10/16/2019, [facility]  How have you been since you were released from the hospital? Patient states that he is feeling much better.   Any questions or concerns? No   Items Reviewed:  Did the pt receive and understand the discharge instructions provided? Yes   Medications obtained and verified? Yes   Any new allergies since your discharge? No   Dietary orders reviewed? Yes  Do you have support at home? Yes   Functional Questionnaire: (I = Independent and D = Dependent) ADLs: I  Bathing/Dressing- I  Meal Prep- I  Eating- I  Maintaining continence- I  Transferring/Ambulation- I  Managing Meds- I  Follow up appointments reviewed:   PCP Hospital f/u appt confirmed? No  Patient stated that he had already spoken with Dr. Silvio Pate and is aware that he will be out of the office for a while. He is going to follow up with cardiology currently and schedule an appointment with Dr. Silvio Pate at a later date upon his return.   [facility] f/u appt confirmed? Yes  Scheduled to see cardiology.  Are transportation arrangements needed? No   If their condition worsens, is the pt aware to call PCP or go to the Emergency Dept.? Yes  Was the patient provided with contact information for the PCP's office or ED? Yes  Was to pt encouraged to call back with questions or concerns? Yes

## 2019-10-16 NOTE — Plan of Care (Signed)

## 2019-10-17 NOTE — Telephone Encounter (Signed)
Yes--- I spoke to him when he was in the hospital, and he knew I would be out for a while

## 2019-10-19 ENCOUNTER — Encounter: Payer: Self-pay | Admitting: *Deleted

## 2019-10-19 ENCOUNTER — Other Ambulatory Visit: Payer: Self-pay | Admitting: *Deleted

## 2019-10-19 NOTE — Patient Outreach (Signed)
[name]) Care Management [facility] CM Telephone Outreach, EMMI Red Alert notification- General Discharge PCP completes Transition of Care follow up post-hospital discharge Post-hospital discharge day # 3  10/19/2019  Philip Mack August 29, 1951 [id number]  EMMI Red Alert notification/ General Discharge EMMI call date/ day #: Thursday, October 18, 2019; day # 1 Red Alert reason(s): "no scheduled follow up"  Telephone outreach to Philip Mack, 69 y/o male referred to [facility] RN CM by [facility] CMA after EMMI Red Alert notification received; patient was recently hospitalized February 12-16, 2021 with chest pain and NSTEMI; patient had cardiac catherization which revealed 99% LAD blockage; PCI/ stenting was completed.  Patient was discharged home to self-care without home health services in place.  HIPAA/ identity verified with patient today; purpose of call and [facility] CM services discussed with patient; patient provides verbal consent to complete EMMI screening call, but he declines ongoing [facility] CM follow up, stating no needs.  Patient appreciative of follow up letter, stating he will contact [facility] CM should needs arise in the future.  Screening call successfully completed.  Patient denies pain today and confirms that he was visited by cardiologist while he was in hospital; stated that the cardiologist told him to wait on a call from his staff to schedule appointment; patient has not yet heard from cardiology team- encouraged him to place call to cardiology office early next week if he has not been contacted by then, and her verbalizes agreement and confirms that he has contact information for cardiology and PCP teams.  -- hospital discharge instructions reviewed with patient -- Patient was recently discharged from hospital and all medications were reviewed with him during phone call today: patient denies current medication needs and confirms that he has obtained all medications and  is taking as prescribed; self-manages medications- no medication concerns identified -- SDOH completed for: depression/ transportation/ food insecurity: no concerns identified -- patient independent in ADL/ iADL's; drives self to appointments -- has received both doses of his corona virus vaccine  Patient denies further issues, concerns, or problems today.  I provided patient with my direct phone number, the main [facility] CM office phone number, and the [facility] CM 24-hour nurse advice phone number should issues arise in the future and he wish to participate in the [facility] CM program.  Plan:  Will close [facility] CM EMMI case, as no ongoing needs were identified during screening call today, and will make patient's PCP aware of same.  Oneta Rack, RN, BSN, [facility] Care Management  [phone]

## 2019-10-23 ENCOUNTER — Encounter: Payer: Self-pay | Admitting: Emergency Medicine

## 2019-10-23 ENCOUNTER — Emergency Department: Payer: Medicare Other

## 2019-10-23 ENCOUNTER — Other Ambulatory Visit: Payer: Self-pay

## 2019-10-23 DIAGNOSIS — I252 Old myocardial infarction: Secondary | ICD-10-CM | POA: Insufficient documentation

## 2019-10-23 DIAGNOSIS — N132 Hydronephrosis with renal and ureteral calculous obstruction: Secondary | ICD-10-CM | POA: Diagnosis not present

## 2019-10-23 DIAGNOSIS — Z87891 Personal history of nicotine dependence: Secondary | ICD-10-CM | POA: Insufficient documentation

## 2019-10-23 DIAGNOSIS — N23 Unspecified renal colic: Secondary | ICD-10-CM | POA: Diagnosis not present

## 2019-10-23 DIAGNOSIS — R109 Unspecified abdominal pain: Secondary | ICD-10-CM | POA: Diagnosis not present

## 2019-10-23 LAB — BASIC METABOLIC PANEL
Anion gap: 10 (ref 5–15)
BUN: 20 mg/dL (ref 8–23)
CO2: 25 mmol/L (ref 22–32)
Calcium: 9.5 mg/dL (ref 8.9–10.3)
Chloride: 103 mmol/L (ref 98–111)
Creatinine, Ser: 1.57 mg/dL — ABNORMAL HIGH (ref 0.61–1.24)
GFR calc Af Amer: 52 mL/min — ABNORMAL LOW (ref >60)
GFR calc non Af Amer: 45 mL/min — ABNORMAL LOW (ref >60)
Glucose, Bld: 128 mg/dL — ABNORMAL HIGH (ref 70–99)
Potassium: 4.2 mmol/L (ref 3.5–5.1)
Sodium: 138 mmol/L (ref 135–145)

## 2019-10-23 LAB — CBC
HCT: 43.6 % (ref 39.0–52.0)
Hemoglobin: 14.8 g/dL (ref 13.0–17.0)
MCH: 30.3 pg (ref 26.0–34.0)
MCHC: 33.9 g/dL (ref 30.0–36.0)
MCV: 89.3 fL (ref 80.0–100.0)
Platelets: 202 10*3/uL (ref 150–400)
RBC: 4.88 MIL/uL (ref 4.22–5.81)
RDW: 12.6 % (ref 11.5–15.5)
WBC: 10.4 10*3/uL (ref 4.0–10.5)
nRBC: 0 % (ref 0.0–0.2)

## 2019-10-23 LAB — URINALYSIS, COMPLETE (UACMP) WITH MICROSCOPIC
Bacteria, UA: NONE SEEN
Bilirubin Urine: NEGATIVE
Glucose, UA: NEGATIVE mg/dL
Hgb urine dipstick: NEGATIVE
Ketones, ur: 5 mg/dL — AB
Leukocytes,Ua: NEGATIVE
Nitrite: NEGATIVE
Protein, ur: NEGATIVE mg/dL
Specific Gravity, Urine: 1.026 (ref 1.005–1.030)
pH: 5 (ref 5.0–8.0)

## 2019-10-23 MED ORDER — IOHEXOL 300 MG/ML  SOLN
75.0000 mL | Freq: Once | INTRAMUSCULAR | Status: AC | PRN
  Administered 2019-10-23: 23:00:00 75 mL via INTRAVENOUS

## 2019-10-23 NOTE — ED Triage Notes (Addendum)
Pt arrived via POV with reports of L flank pain that started this afternoon.  Pt reports some feelings of nausea as well.  Pt reports some difficulty urinating today, pt reports decreased output today and has urgency, but states he dribbles.  Pt had MI on 2/12 with stent placement 2/15.

## 2019-10-23 NOTE — ED Notes (Signed)
Discussed with Dr. Kerman Passey, new orders received for CT AP with contrast, but no oral contrast and basic labs.

## 2019-10-24 ENCOUNTER — Other Ambulatory Visit: Payer: Self-pay | Admitting: Urology

## 2019-10-24 ENCOUNTER — Ambulatory Visit
Admission: RE | Admit: 2019-10-24 | Discharge: 2019-10-24 | Disposition: A | Discharge status: Home / Self Care | Payer: Medicare Other | Source: Ambulatory Visit | Attending: Urology | Admitting: Urology

## 2019-10-24 ENCOUNTER — Other Ambulatory Visit: Payer: Self-pay | Admitting: Radiology

## 2019-10-24 ENCOUNTER — Emergency Department
Admission: EM | Admit: 2019-10-24 | Discharge: 2019-10-24 | Disposition: A | Discharge status: Home / Self Care | Payer: Medicare Other | Attending: Emergency Medicine | Admitting: Emergency Medicine

## 2019-10-24 ENCOUNTER — Ambulatory Visit (INDEPENDENT_AMBULATORY_CARE_PROVIDER_SITE_OTHER): Payer: Medicare Other | Admitting: Urology

## 2019-10-24 ENCOUNTER — Encounter: Payer: Self-pay | Admitting: Urology

## 2019-10-24 VITALS — BP 104/63 | HR 64 | Ht 72.0 in | Wt 218.0 lb

## 2019-10-24 DIAGNOSIS — N201 Calculus of ureter: Secondary | ICD-10-CM

## 2019-10-24 DIAGNOSIS — N200 Calculus of kidney: Secondary | ICD-10-CM | POA: Diagnosis not present

## 2019-10-24 DIAGNOSIS — N23 Unspecified renal colic: Secondary | ICD-10-CM | POA: Diagnosis not present

## 2019-10-24 DIAGNOSIS — N132 Hydronephrosis with renal and ureteral calculous obstruction: Secondary | ICD-10-CM | POA: Diagnosis not present

## 2019-10-24 DIAGNOSIS — R109 Unspecified abdominal pain: Secondary | ICD-10-CM

## 2019-10-24 LAB — URINALYSIS, COMPLETE
Bilirubin, UA: NEGATIVE
Glucose, UA: NEGATIVE
Ketones, UA: NEGATIVE
Leukocytes,UA: NEGATIVE
Nitrite, UA: NEGATIVE
Protein,UA: NEGATIVE
RBC, UA: NEGATIVE
Specific Gravity, UA: 1.02 (ref 1.005–1.030)
Urobilinogen, Ur: 0.2 mg/dL (ref 0.2–1.0)
pH, UA: 5.5 (ref 5.0–7.5)

## 2019-10-24 LAB — MICROSCOPIC EXAMINATION
Bacteria, UA: NONE SEEN
RBC, Urine: NONE SEEN /hpf (ref 0–2)

## 2019-10-24 MED ORDER — TAMSULOSIN HCL 0.4 MG PO CAPS
0.4000 mg | ORAL_CAPSULE | Freq: Once | ORAL | Status: AC
  Administered 2019-10-24: 0.4 mg via ORAL
  Filled 2019-10-24: qty 1

## 2019-10-24 MED ORDER — SODIUM CHLORIDE 0.9 % IV BOLUS
1000.0000 mL | Freq: Once | INTRAVENOUS | Status: AC
  Administered 2019-10-24: 1000 mL via INTRAVENOUS

## 2019-10-24 MED ORDER — TAMSULOSIN HCL 0.4 MG PO CAPS: 0.4000 mg | ORAL_CAPSULE | Freq: Every day | ORAL | 0 refills | Status: DC

## 2019-10-24 MED ORDER — ONDANSETRON 4 MG PO TBDP: 4.0000 mg | ORAL_TABLET | Freq: Three times a day (TID) | ORAL | 0 refills | Status: DC | PRN

## 2019-10-24 MED ORDER — SODIUM CHLORIDE 0.9 % IV BOLUS: 1000.0000 mL | Freq: Once | INTRAVENOUS | Status: DC

## 2019-10-24 MED ORDER — OXYCODONE-ACETAMINOPHEN 5-325 MG PO TABS
1.0000 | ORAL_TABLET | Freq: Once | ORAL | Status: AC
  Administered 2019-10-24: 1 via ORAL
  Filled 2019-10-24: qty 1

## 2019-10-24 MED ORDER — OXYCODONE-ACETAMINOPHEN 5-325 MG PO TABS: 1.0000 | ORAL_TABLET | ORAL | 0 refills | Status: DC | PRN

## 2019-10-24 NOTE — ED Provider Notes (Signed)
[facility] Emergency Department Provider Note   ____________________________________________   First MD Initiated Contact with Patient 10/24/19 0041     (approximate)  I have reviewed the triage vital signs and the nursing notes.   HISTORY  Chief Complaint Flank Pain    HPI Philip Mack is a 69 y.o. male who presents to the ED from home with a chief complaint of left flank pain.  Patient reports a 2 to 3-day history of left flank pain which worsened yesterday afternoon.  Symptoms associated with nausea, no vomiting.  Also reports some difficulty urinating yesterday, dribbling.  Most recently patient had an MI on 2/12 with stent placement 2/15.  Currently is taking Brilinta.  Denies fever, chills, chest pain, shortness of breath, abdominal pain, hematuria.  Denies recent trauma.  No prior history of kidney stones.       Past Medical History:  Diagnosis Date  . Cataract   . Hepatitis   . Hyperlipidemia     Patient Active Problem List   Diagnosis Date Noted  . NSTEMI (non-ST elevated myocardial infarction) ([place]) 10/12/2019  . Right facial numbness 07/31/2019  . 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 Peyronie's disease 07/15/2017  . Advance directive discussed with patient 07/14/2016  . Right shoulder pain 07/08/2014  . Hyperlipidemia   . Preventative health care 01/26/2011    Past Surgical History:  Procedure Laterality Date  . Arm injury  1975  . COLONOSCOPY    . CORONARY STENT INTERVENTION N/A 10/15/2019   Procedure: CORONARY STENT INTERVENTION;  Surgeon: Yolonda Kida, MD;  Location: [facility];  Service: Cardiovascular;  Laterality: N/A;  . HERNIA REPAIR  1959   Bilateral, inguinal  . LEFT HEART CATH AND CORONARY ANGIOGRAPHY N/A 10/15/2019   Procedure: LEFT HEART CATH AND CORONARY ANGIOGRAPHY;  Surgeon: Corey Skains, MD;  Location: [facility] [place] [facility];  Service: Cardiovascular;  Laterality:  N/A;  . POLYPECTOMY    . TONSILLECTOMY AND ADENOIDECTOMY  1961    Prior to Admission medications   Medication Sig Start Date End Date Taking? Authorizing Provider  aspirin 81 MG chewable tablet Chew 1 tablet (81 mg total) by mouth daily. 10/16/19   Wyvonnia Dusky, MD  atorvastatin (LIPITOR) 40 MG tablet Take 1 tablet (40 mg total) by mouth daily at 6 PM. 10/16/19 11/15/19  Wyvonnia Dusky, MD  metoprolol tartrate (LOPRESSOR) 25 MG tablet Take 0.5 tablets (12.5 mg total) by mouth 2 (two) times daily. 10/16/19 11/15/19  Wyvonnia Dusky, MD  ondansetron (ZOFRAN ODT) 4 MG disintegrating tablet Take 1 tablet (4 mg total) by mouth every 8 (eight) hours as needed for nausea or vomiting. 10/24/19   Paulette Blanch, MD  oxyCODONE-acetaminophen (PERCOCET/ROXICET) 5-325 MG tablet Take 1 tablet by mouth every 4 (four) hours as needed for severe pain. 10/24/19   Paulette Blanch, MD  tamsulosin (FLOMAX) 0.4 MG CAPS capsule Take 1 capsule (0.4 mg total) by mouth daily. 10/24/19   Paulette Blanch, MD  ticagrelor (BRILINTA) 90 MG TABS tablet Take 1 tablet (90 mg total) by mouth 2 (two) times daily. 10/16/19 11/15/19  Wyvonnia Dusky, MD    Allergies Patient has no known allergies.  Family History  Problem Relation Age of Onset  . Cancer Mother        Lung, (surgically resected)  . Heart disease Mother   . Hearing loss Other        On Mom's side  .  Hypertension Neg Hx   . Diabetes Neg Hx   . Colon cancer Neg Hx   . Colon polyps Neg Hx   . Stomach cancer Neg Hx   . Rectal cancer Neg Hx     Social History Social History   Tobacco Use  . Smoking status: Former Smoker    Types: Cigarettes    Quit date: 08/30/1980    Years since quitting: 39.1  . Smokeless tobacco: Never Used  Substance Use Topics  . Alcohol use: Yes    Comment: Occasionally  . Drug use: No    Review of Systems  Constitutional: No fever/chills Eyes: No visual changes. ENT: No sore throat. Cardiovascular: Denies chest  pain. Respiratory: Denies shortness of breath. Gastrointestinal: Positive for left flank pain and nausea.  No abdominal pain.  No vomiting.  No diarrhea.  No constipation. Genitourinary: Positive for hesitancy.  Negative for dysuria. Musculoskeletal: Negative for back pain. Skin: Negative for rash. Neurological: Negative for headaches, focal weakness or numbness.   ____________________________________________   PHYSICAL EXAM:  VITAL SIGNS: ED Triage Vitals [10/23/19 2209]  Enc Vitals Group     BP (!) 151/84     Pulse Rate (!) 56     Resp 18     Temp 98.1 F (36.7 C)     Temp Source Oral     SpO2 98 %     Weight 218 lb 14.7 oz (99.3 kg)     Height 6\' 4"  (1.93 m)     Head Circumference      Peak Flow      Pain Score 8     Pain Loc      Pain Edu?      Excl. in [place]?     Constitutional: Alert and oriented. Well appearing and in no acute distress. Eyes: Conjunctivae are normal. PERRL. EOMI. Head: Atraumatic. Nose: No congestion/rhinnorhea. Mouth/Throat: Mucous membranes are moist.  Oropharynx non-erythematous. Neck: No stridor.   Cardiovascular: Normal rate, regular rhythm. Grossly normal heart sounds.  Good peripheral circulation. Respiratory: Normal respiratory effort.  No retractions. Lungs CTAB. Gastrointestinal: Soft and nontender. No distention. No abdominal bruits.  Mild left CVA tenderness. Musculoskeletal: No lower extremity tenderness nor edema.  No joint effusions. Neurologic:  Normal speech and language. No gross focal neurologic deficits are appreciated. No gait instability. Skin:  Skin is warm, dry and intact. No rash noted. Psychiatric: Mood and affect are normal. Speech and behavior are normal.  ____________________________________________   LABS (all labs ordered are listed, but only abnormal results are displayed)  Labs Reviewed  URINALYSIS, COMPLETE (UACMP) WITH MICROSCOPIC - Abnormal; Notable for the following components:      Result Value   Color,  Urine YELLOW (*)    APPearance CLEAR (*)    Ketones, ur 5 (*)    All other components within normal limits  BASIC METABOLIC PANEL - Abnormal; Notable for the following components:   Glucose, Bld 128 (*)    Creatinine, Ser 1.57 (*)    GFR calc non Af Amer 45 (*)    GFR calc Af Amer 52 (*)    All other components within normal limits  CBC   ____________________________________________  EKG  None ____________________________________________  RADIOLOGY  ED MD interpretation: 7 mm distal left ureteral stone with mild left hydronephrosis, sigmoid diverticulosis, indeterminate lucent lesion in posterior right iliac bone  Official radiology report(s): CT ABDOMEN PELVIS W CONTRAST  Result Date: 10/23/2019 CLINICAL DATA:  69 year old male with left flank pain and  nausea. EXAM: CT ABDOMEN AND PELVIS WITH CONTRAST TECHNIQUE: Multidetector CT imaging of the abdomen and pelvis was performed using the standard protocol following bolus administration of intravenous contrast. CONTRAST:  59mL OMNIPAQUE IOHEXOL 300 MG/ML  SOLN COMPARISON:  None. FINDINGS: Lower chest: The visualized lung bases are clear. No intra-abdominal free air or free fluid. Hepatobiliary: There is fatty infiltration of the liver. No intrahepatic biliary ductal dilatation. The gallbladder is unremarkable. Pancreas: Unremarkable. No pancreatic ductal dilatation or surrounding inflammatory changes. Spleen: Normal in size without focal abnormality. Adrenals/Urinary Tract: The adrenal glands are unremarkable. There is a 7 mm distal left ureteral calculus with mild left hydronephrosis. Several additional punctate nonobstructing left renal inferior pole calculi noted. Probable punctate nonobstructing stone in the upper pole of the right kidney. There is no hydronephrosis on the right. There is delayed enhancement and excretion of contrast by the left kidney. Left perinephric stranding noted. Correlation with urinalysis recommended to exclude  superimposed UTI. The right ureter and urinary bladder appear unremarkable. Stomach/Bowel: There is sigmoid diverticulosis without active inflammatory changes. There is no bowel obstruction or active inflammation. The appendix is normal. Vascular/Lymphatic: Mild aortoiliac atherosclerotic disease. The IVC is unremarkable. No portal venous gas. There is no adenopathy. Reproductive: The prostate and seminal vesicles are grossly unremarkable. Other: None Musculoskeletal: Degenerative changes of the spine. No acute osseous pathology. Indeterminate 2.1 x 1.4 cm lucent lesion in the right iliac bone. This can be better evaluated with MRI on a nonemergent basis. IMPRESSION: 1. A 7 mm distal left ureteral calculus with mild left hydronephrosis. Correlation with urinalysis recommended to exclude superimposed UTI. 2. Sigmoid diverticulosis. No bowel obstruction or active inflammation. Normal appendix. 3. Fatty liver. 4. Mild Aortic Atherosclerosis (ICD10-I70.0). 5. Indeterminate 2 cm lucent lesion in the posterior right iliac bone. This can be better evaluated with MRI on a nonemergent/outpatient basis. Electronically Signed   By: Anner Crete M.D.   On: 10/23/2019 23:11    ____________________________________________   PROCEDURES  Procedure(s) performed (including Critical Care):  Procedures   ____________________________________________   INITIAL IMPRESSION / ASSESSMENT AND PLAN / ED COURSE  As part of my medical decision making, I reviewed the following data within the [facility] notes reviewed and incorporated, Labs reviewed, Old chart reviewed, Radiograph reviewed, Notes from prior ED visits and [place] Controlled Substance Database     Carwyn Lacina was evaluated in Emergency Department on 10/24/2019 for the symptoms described in the history of present illness. He was evaluated in the context of the global COVID-19 pandemic, which necessitated consideration that the patient  might be at risk for infection with the SARS-CoV-2 virus that causes COVID-19. Institutional protocols and algorithms that pertain to the evaluation of patients at risk for COVID-19 are in a state of rapid change based on information released by regulatory bodies including the CDC and federal and state organizations. These policies and algorithms were followed during the patient's care in the ED.    69 year old male who presents with left flank pain. Differential diagnosis includes, but is not limited to, acute appendicitis, renal colic, testicular torsion, urinary tract infection/pyelonephritis, prostatitis,  epididymitis, diverticulitis, small bowel obstruction or ileus, colitis, abdominal aortic aneurysm, gastroenteritis, hernia, etc.  Laboratory results reveal mild AKI.  CT demonstrates 7 mm stone.  Patient reports he was able to urinate freely while awaiting treatment room and subsequently pain is almost gone.  Will hydrate with IV fluids, start oral Flomax and Percocet.  Will discharge home on Flomax, Percocet and  Zofran to use as needed and patient will follow up closely with urology.  Strict return precautions given.  Patient verbalizes understanding agrees with plan of care.      ____________________________________________   FINAL CLINICAL IMPRESSION(S) / ED DIAGNOSES  Final diagnoses:  Left flank pain  Ureteral colic     ED Discharge Orders         Ordered    tamsulosin (FLOMAX) 0.4 MG CAPS capsule  Daily     10/24/19 0117    oxyCODONE-acetaminophen (PERCOCET/ROXICET) 5-325 MG tablet  Every 4 hours PRN     10/24/19 0117    ondansetron (ZOFRAN ODT) 4 MG disintegrating tablet  Every 8 hours PRN     10/24/19 0117           Note:  This document was prepared using Dragon voice recognition software and may include unintentional dictation errors.   Paulette Blanch, MD 10/24/19 [phone]

## 2019-10-24 NOTE — Discharge Instructions (Addendum)
1. Take pain & nausea medicines as needed (Percocet/Zofran #30). Make sure to take a stool softener while taking narcotic pain medicines. 2. Take Flomax 0.4mg  daily x 14 days. 3. Drink plenty of bottled or filtered water daily. 4. There is a dark spot on your right iliac bone found incidentally on CT scan. This may be further evaluated by your doctor with MRI on an outpatient basis. 5. Return to the ER for worsening symptoms, persistent vomiting, fever, difficulty breathing or other concerns.

## 2019-10-24 NOTE — ED Notes (Signed)
Pt ambulatory to the restroom with steady gait.

## 2019-10-24 NOTE — ED Notes (Signed)
Pt resting on recliner chair. States he is feeling a little better at this time.

## 2019-10-25 ENCOUNTER — Encounter: Payer: Self-pay | Admitting: Urology

## 2019-10-25 NOTE — Progress Notes (Signed)
10/24/2019 6:58 AM   Philip Mack 30-Jun-1951 [id number]  Referring provider: Venia Carbon, MD [address] [place],  [place] [address]  Chief Complaint  Patient presents with  . Nephrolithiasis    HPI: 69 y.o. male previously seen for Peyronie's disease.  He states approximately 1 week ago he had intermittent episodes of left flank pain.  He presented to the ED early 10/23/2019 with a 2-3-day history of left flank pain which acutely worsened that day.  The pain was in the left flank and was nonradiating.  No precipitating, aggravating or alleviating factors.  He had nausea without vomiting.  Denied fever or chills.  A CT of the abdomen pelvis with contrast was performed which was remarkable for a left distal ureteral calculus with mild hydronephrosis/hydroureter.  Punctate, bilateral renal calculi were also noted.  He received parenteral analgesics in the ED with pain control and was discharged on tamsulosin, hydrocodone and Zofran. Denies prior history of stone disease.  Since his ED visit he has had minimal pain.  He was hospitalized on 10/12/2019 with chest pain and NSTEMI and underwent angioplasty with placement of a drug-eluting stent.  Dual antiplatelet therapy with Brilinta and ASA recommended x12 months.   PMH: Past Medical History:  Diagnosis Date  . Cataract   . Hepatitis   . Hyperlipidemia     Surgical History: Past Surgical History:  Procedure Laterality Date  . Arm injury  1975  . COLONOSCOPY    . CORONARY STENT INTERVENTION N/A 10/15/2019   Procedure: CORONARY STENT INTERVENTION;  Surgeon: Yolonda Kida, MD;  Location: [facility];  Service: Cardiovascular;  Laterality: N/A;  . HERNIA REPAIR  1959   Bilateral, inguinal  . LEFT HEART CATH AND CORONARY ANGIOGRAPHY N/A 10/15/2019   Procedure: LEFT HEART CATH AND CORONARY ANGIOGRAPHY;  Surgeon: Corey Skains, MD;  Location: [facility];  Service: Cardiovascular;   Laterality: N/A;  . POLYPECTOMY    . TONSILLECTOMY AND ADENOIDECTOMY  1961    Home Medications:  Allergies as of 10/24/2019   No Known Allergies     Medication List       Accurate as of October 24, 2019 11:59 PM. If you have any questions, ask your nurse or doctor.        aspirin 81 MG chewable tablet Chew 1 tablet (81 mg total) by mouth daily.   atorvastatin 40 MG tablet Commonly known as: LIPITOR Take 1 tablet (40 mg total) by mouth daily at 6 PM.   metoprolol tartrate 25 MG tablet Commonly known as: LOPRESSOR Take 0.5 tablets (12.5 mg total) by mouth 2 (two) times daily.   ondansetron 4 MG disintegrating tablet Commonly known as: Zofran ODT Take 1 tablet (4 mg total) by mouth every 8 (eight) hours as needed for nausea or vomiting.   oxyCODONE-acetaminophen 5-325 MG tablet Commonly known as: PERCOCET/ROXICET Take 1 tablet by mouth every 4 (four) hours as needed for severe pain.   tamsulosin 0.4 MG Caps capsule Commonly known as: Flomax Take 1 capsule (0.4 mg total) by mouth daily.   ticagrelor 90 MG Tabs tablet Commonly known as: BRILINTA Take 1 tablet (90 mg total) by mouth 2 (two) times daily.       Allergies: No Known Allergies  Family History: Family History  Problem Relation Age of Onset  . Cancer Mother        Lung, (surgically resected)  . Heart disease Mother   . Hearing loss Other  On Mom's side  . Hypertension Neg Hx   . Diabetes Neg Hx   . Colon cancer Neg Hx   . Colon polyps Neg Hx   . Stomach cancer Neg Hx   . Rectal cancer Neg Hx     Social History:  reports that he quit smoking about 39 years ago. His smoking use included cigarettes. He has never used smokeless tobacco. He reports current alcohol use. He reports that he does not use drugs.   Physical Exam: BP 104/63   Pulse 64   Ht 6' (1.829 m)   Wt 218 lb (98.9 kg)   BMI 29.57 kg/m   Constitutional:  Alert and oriented, No acute distress. HEENT: [place] AT, moist mucus  membranes.  Trachea midline, no masses. Cardiovascular: No clubbing, cyanosis, or edema. Respiratory: Normal respiratory effort, no increased work of breathing. Skin: No rashes, bruises or suspicious lesions. Neurologic: Grossly intact, no focal deficits, moving all 4 extremities. Psychiatric: Normal mood and affect.   Pertinent Imaging: CT and KUB personally reviewed Results for orders placed during the hospital encounter of 10/24/19  Abdomen 1 view (KUB)   Narrative CLINICAL DATA:  Kidney stone follow-up, LEFT-sided pain which has resolved  EXAM: ABDOMEN - 1 VIEW  COMPARISON:  CT abdomen pelvis 10/23/2019  FINDINGS: Excreted contrast identified within the urinary bladder and the distal LEFT ureter.  Distal LEFT ureter appears dilated extending to a point just above the ureterovesical junction where the calculus was seen on the scout image of the prior CT, unchanged.  No additional urinary tract calcifications.  Bowel gas pattern normal.  No acute osseous findings.  Degenerative disc disease changes lumbar spine.  IMPRESSION: Mildly dilated distal LEFT ureter terminating at the previously identified distal LEFT ureteral calculus, unchanged since scout image of prior CT.   Electronically Signed   By: Lavonia Dana M.D.   On: 10/24/2019 14:17     Assessment & Plan:    - Left distal ureteral calculus with renal colic KUB performed today does show remaining contrast coming to the 7 mm calculus in the distal ureter.  He presently is minimally symptomatic.  We discussed various treatment options for urolithiasis including observation with or without medical expulsive therapy, shockwave lithotripsy (SWL), ureteroscopy and laser lithotripsy with stent placement.  We discussed that management is based on stone size, location, density, patient co-morbidities, and patient preference.   Stones <27mm in size have a >80% spontaneous passage rate. Data surrounding the use of  tamsulosin for medical expulsive therapy is controversial, but meta analyses suggests it is most efficacious for distal stones between 5-47mm in size.   SWL has a lower stone free rate in a single procedure, but also a lower complication rate compared to ureteroscopy and avoids a stent and associated stent related symptoms. Possible complications include renal hematoma, steinstrasse, and need for additional treatment.  Ureteroscopy with laser lithotripsy and stent placement has a higher stone free rate than SWL in a single procedure, however increased complication rate including possible infection, ureteral injury, bleeding, and stent related morbidity. Common stent related symptoms include dysuria, urgency/frequency, and flank pain.  After an extensive discussion of the risks and benefits of the above treatment options, the patient would initially prefer a limited trial of passage with medical expulsion therapy.  If he does require intervention will need cardiology clearance for anesthesia.  Ureteroscopy could be performed on dual antiplatelet therapy.  We will need to check the latest [place] guidelines for treatment of distal  ureteral stones on antiplatelet therapy.  A KUB for early next week was ordered to assess for distal stone progression.   Abbie Sons, [place] [address], [place], [place] [address] [phone]

## 2019-10-27 LAB — CULTURE, URINE COMPREHENSIVE

## 2019-10-29 ENCOUNTER — Ambulatory Visit
Admission: RE | Admit: 2019-10-29 | Discharge: 2019-10-29 | Disposition: A | Discharge status: Home / Self Care | Payer: Medicare Other | Source: Ambulatory Visit | Attending: Urology | Admitting: Urology

## 2019-10-29 ENCOUNTER — Other Ambulatory Visit: Payer: Self-pay

## 2019-10-29 DIAGNOSIS — N201 Calculus of ureter: Secondary | ICD-10-CM | POA: Insufficient documentation

## 2019-10-30 ENCOUNTER — Telehealth: Payer: Self-pay | Admitting: Urology

## 2019-10-30 NOTE — Telephone Encounter (Signed)
Pt LMOM asking about x-ray results.

## 2019-10-31 ENCOUNTER — Telehealth: Payer: Self-pay

## 2019-10-31 NOTE — Telephone Encounter (Signed)
Spoke to pt to see how he is doing after recent ER visit. He has been to urology. Had a recent Xray that shows the stone has not moved.  Felt a little.

## 2019-11-05 ENCOUNTER — Other Ambulatory Visit: Payer: Self-pay

## 2019-11-05 ENCOUNTER — Other Ambulatory Visit: Payer: Self-pay | Admitting: *Deleted

## 2019-11-05 MED ORDER — TAMSULOSIN HCL 0.4 MG PO CAPS: 0.4000 mg | ORAL_CAPSULE | Freq: Every day | ORAL | 1 refills | Status: DC

## 2019-11-05 NOTE — Telephone Encounter (Signed)
Pt calls triage line, states that he would like refill on Flomax. He was given RX in the ED. Will be out today. Has yet to pass any stones.

## 2019-11-05 NOTE — Telephone Encounter (Signed)
Patient left a voicemail stating that he went to the ER because of a kidney stone. Patient stated that he was given Flomax and will run out tomorrow. Patient stated that he has not passed the stone and needs another refill on the Flomax. Patient stated that he has seen the urologist and has also left a message for him regarding the refill.

## 2019-11-05 NOTE — Telephone Encounter (Signed)
Please let him know I sent the refill 

## 2019-11-06 NOTE — Telephone Encounter (Signed)
Looks like Dr. Silvio Pate sent in a prescription to [facility] yesterday

## 2019-11-07 NOTE — Telephone Encounter (Signed)
Advised pt. Pt verbalized understanding.

## 2019-11-09 ENCOUNTER — Encounter: Payer: Medicare Other | Attending: Internal Medicine | Admitting: *Deleted

## 2019-11-09 ENCOUNTER — Other Ambulatory Visit: Payer: Self-pay

## 2019-11-09 DIAGNOSIS — Z955 Presence of coronary angioplasty implant and graft: Secondary | ICD-10-CM

## 2019-11-09 DIAGNOSIS — I10 Essential (primary) hypertension: Secondary | ICD-10-CM | POA: Diagnosis not present

## 2019-11-09 DIAGNOSIS — E782 Mixed hyperlipidemia: Secondary | ICD-10-CM | POA: Diagnosis not present

## 2019-11-09 DIAGNOSIS — I214 Non-ST elevation (NSTEMI) myocardial infarction: Secondary | ICD-10-CM | POA: Insufficient documentation

## 2019-11-09 DIAGNOSIS — I2510 Atherosclerotic heart disease of native coronary artery without angina pectoris: Secondary | ICD-10-CM | POA: Insufficient documentation

## 2019-11-09 DIAGNOSIS — I25118 Atherosclerotic heart disease of native coronary artery with other forms of angina pectoris: Secondary | ICD-10-CM | POA: Diagnosis not present

## 2019-11-09 NOTE — Progress Notes (Signed)
Initial phone orientation completed. Diagnosis can be found in [facility] 2/12. EP orientation scheduled for 3/15 at 8am

## 2019-11-12 ENCOUNTER — Other Ambulatory Visit: Payer: Self-pay

## 2019-11-12 ENCOUNTER — Encounter: Payer: Medicare Other | Admitting: *Deleted

## 2019-11-12 VITALS — Ht 76.4 in | Wt 211.6 lb

## 2019-11-12 DIAGNOSIS — I214 Non-ST elevation (NSTEMI) myocardial infarction: Secondary | ICD-10-CM

## 2019-11-12 DIAGNOSIS — Z955 Presence of coronary angioplasty implant and graft: Secondary | ICD-10-CM

## 2019-11-12 NOTE — Patient Instructions (Signed)
Patient Instructions  Patient Details  Name: Philip Mack MRN: [id number] Date of Birth: 1950/09/06 Referring Provider:  Yolonda Kida, MD  Below are your personal goals for exercise, nutrition, and risk factors. Our goal is to help you stay on track towards obtaining and maintaining these goals. We will be discussing your progress on these goals with you throughout the program.  Initial Exercise Prescription: Initial Exercise Prescription - 11/12/19 1000      Date of Initial Exercise RX and Referring Provider   Date  11/12/19    Referring Provider  [name]   Primary Cardiologist Dr. [name]     Treadmill   MPH  2.9    Grade  1    Minutes  15    METs  3.6      Elliptical   Level  2    Speed  3.5    Minutes  15    METs  3      REL-XR   Level  3    Speed  50    Minutes  15    METs  3      Prescription Details   Frequency (times per week)  3    Duration  Progress to 30 minutes of continuous aerobic without signs/symptoms of physical distress      Intensity   THRR 40-80% of Max Heartrate  101-135    Ratings of Perceived Exertion  11-13    Perceived Dyspnea  0-4      Progression   Progression  Continue to progress workloads to maintain intensity without signs/symptoms of physical distress.      Resistance Training   Training Prescription  Yes    Weight  3 lb    Reps  10-15       Exercise Goals: Frequency: Be able to perform aerobic exercise two to three times per week in program working toward 2-5 days per week of home exercise.  Intensity: Work with a perceived exertion of 11 (fairly light) - 15 (hard) while following your exercise prescription.  We will make changes to your prescription with you as you progress through the program.   Duration: Be able to do 30 to 45 minutes of continuous aerobic exercise in addition to a 5 minute warm-up and a 5 minute cool-down routine.   Nutrition Goals: Your personal nutrition goals will be  established when you do your nutrition analysis with the dietician.  The following are general nutrition guidelines to follow: Cholesterol < 200mg /day Sodium < 1500mg /day Fiber: Men over 50 yrs - 30 grams per day  Personal Goals: Personal Goals and Risk Factors at Admission - 11/12/19 1042      Core Components/Risk Factors/Patient Goals on Admission    Weight Management  Yes;Weight Loss    Intervention  Weight Management: Develop a combined nutrition and exercise program designed to reach desired caloric intake, while maintaining appropriate intake of nutrient and fiber, sodium and fats, and appropriate energy expenditure required for the weight goal.;Weight Management: Provide education and appropriate resources to help participant work on and attain dietary goals.    Admit Weight  211 lb 9.6 oz (96 kg)    Goal Weight: Short Term  206 lb (93.4 kg)    Goal Weight: Long Term  200 lb (90.7 kg)    Expected Outcomes  Long Term: Adherence to nutrition and physical activity/exercise program aimed toward attainment of established weight goal;Short Term: Continue to assess and modify interventions until short term  weight is achieved;Weight Loss: Understanding of general recommendations for a balanced deficit meal plan, which promotes 1-2 lb weight loss per week and includes a negative energy balance of [id number] kcal/d;Understanding of distribution of calorie intake throughout the day with the consumption of 4-5 meals/snacks;Understanding recommendations for meals to include 15-35% energy as protein, 25-35% energy from fat, 35-60% energy from carbohydrates, less than 200mg  of dietary cholesterol, 20-35 gm of total fiber daily    Hypertension  Yes    Intervention  Provide education on lifestyle modifcations including regular physical activity/exercise, weight management, moderate sodium restriction and increased consumption of fresh fruit, vegetables, and low fat dairy, alcohol moderation, and smoking  cessation.;Monitor prescription use compliance.    Expected Outcomes  Long Term: Maintenance of blood pressure at goal levels.;Short Term: Continued assessment and intervention until BP is < 140/9mm HG in hypertensive participants. < 130/33mm HG in hypertensive participants with diabetes, heart failure or chronic kidney disease.    Lipids  Yes    Intervention  Provide education and support for participant on nutrition & aerobic/resistive exercise along with prescribed medications to achieve LDL 70mg , HDL >40mg .    Expected Outcomes  Short Term: Participant states understanding of desired cholesterol values and is compliant with medications prescribed. Participant is following exercise prescription and nutrition guidelines.;Long Term: Cholesterol controlled with medications as prescribed, with individualized exercise RX and with personalized nutrition plan. Value goals: LDL < 70mg , HDL > 40 mg.       Tobacco Use Initial Evaluation: Social History   Tobacco Use  Smoking Status Former Smoker  . Types: Cigarettes  . Quit date: 08/30/1980  . Years since quitting: 39.2  Smokeless Tobacco Never Used    Exercise Goals and Review: Exercise Goals    Row Name 11/12/19 1038             Exercise Goals   Increase Physical Activity  Yes       Intervention  Provide advice, education, support and counseling about physical activity/exercise needs.;Develop an individualized exercise prescription for aerobic and resistive training based on initial evaluation findings, risk stratification, comorbidities and participant's personal goals.       Expected Outcomes  Short Term: Attend rehab on a regular basis to increase amount of physical activity.;Long Term: Add in home exercise to make exercise part of routine and to increase amount of physical activity.;Long Term: Exercising regularly at least 3-5 days a week.       Increase Strength and Stamina  Yes       Intervention  Provide advice, education, support  and counseling about physical activity/exercise needs.;Develop an individualized exercise prescription for aerobic and resistive training based on initial evaluation findings, risk stratification, comorbidities and participant's personal goals.       Expected Outcomes  Short Term: Increase workloads from initial exercise prescription for resistance, speed, and METs.;Short Term: Perform resistance training exercises routinely during rehab and add in resistance training at home;Long Term: Improve cardiorespiratory fitness, muscular endurance and strength as measured by increased METs and functional capacity (6MWT)       Able to understand and use rate of perceived exertion (RPE) scale  Yes       Intervention  Provide education and explanation on how to use RPE scale       Expected Outcomes  Short Term: Able to use RPE daily in rehab to express subjective intensity level;Long Term:  Able to use RPE to guide intensity level when exercising independently  Able to understand and use Dyspnea scale  Yes       Intervention  Provide education and explanation on how to use Dyspnea scale       Expected Outcomes  Short Term: Able to use Dyspnea scale daily in rehab to express subjective sense of shortness of breath during exertion;Long Term: Able to use Dyspnea scale to guide intensity level when exercising independently       Knowledge and understanding of Target Heart Rate Range (THRR)  Yes       Intervention  Provide education and explanation of THRR including how the numbers were predicted and where they are located for reference       Expected Outcomes  Short Term: Able to state/look up THRR;Short Term: Able to use daily as guideline for intensity in rehab;Long Term: Able to use THRR to govern intensity when exercising independently       Able to check pulse independently  Yes       Intervention  Provide education and demonstration on how to check pulse in carotid and radial arteries.;Review the importance  of being able to check your own pulse for safety during independent exercise       Expected Outcomes  Short Term: Able to explain why pulse checking is important during independent exercise;Long Term: Able to check pulse independently and accurately       Understanding of Exercise Prescription  Yes       Intervention  Provide education, explanation, and written materials on patient's individual exercise prescription       Expected Outcomes  Short Term: Able to explain program exercise prescription;Long Term: Able to explain home exercise prescription to exercise independently          Copy of goals given to participant.

## 2019-11-12 NOTE — Progress Notes (Signed)
Daily Session Note  Patient Details  Name: Yousof Alderman MRN: [id number] Date of Birth: 19-Feb-1951 Referring Provider:    Encounter Date: 11/12/2019  Check In: Session Check In - 11/12/19 0851      Check-In   Supervising physician immediately available to respond to emergencies  See telemetry face sheet for immediately available ER MD    Location  [facility]    Staff Present  [name], RN, BSN, Laveda Norman, BS, ACSM CEP, Exercise Physiologist;Joseph Tessie Fass RCP,RRT,BSRT    Virtual Visit  No    Medication changes reported      No    Fall or balance concerns reported     No    Warm-up and Cool-down  Performed on first and last piece of equipment    Resistance Training Performed  Yes    VAD Patient?  No    PAD/SET Patient?  No      Pain Assessment   Currently in Pain?  No/denies          Social History   Tobacco Use  Smoking Status Former Smoker  . Types: Cigarettes  . Quit date: 08/30/1980  . Years since quitting: 39.2  Smokeless Tobacco Never Used    Goals Met:  Independence with exercise equipment Exercise tolerated well No report of cardiac concerns or symptoms  Goals Unmet:  Not Applicable  Comments: Pt able to follow exercise prescription today without complaint.  Will continue to monitor for progression.    [name] is Medical Director for [place] and [facility].

## 2019-11-12 NOTE — Progress Notes (Signed)
Cardiac Individual Treatment Plan  Patient Details  Name: Philip Mack MRN: [id number] Date of Birth: 06/30/1951 Referring Provider:     Cardiac Rehab from 11/12/2019 in [facility] Cardiac and Pulmonary Rehab  Referring Provider  [name] [facility] Cardiologist Dr. [name]      Initial Encounter Date:    Cardiac Rehab from 11/12/2019 in [facility] Cardiac and Pulmonary Rehab  Date  11/12/19      Visit Diagnosis: NSTEMI (non-ST elevated myocardial infarction) [facility])  Status post coronary artery stent placement  Patient's Home Medications on Admission:  Current Outpatient Medications:  .  aspirin 81 MG chewable tablet, Chew 1 tablet (81 mg total) by mouth daily., Disp:  , Rfl:  .  atorvastatin (LIPITOR) 40 MG tablet, Take 1 tablet (40 mg total) by mouth daily at 6 PM., Disp: 30 tablet, Rfl: 0 .  metoprolol tartrate (LOPRESSOR) 25 MG tablet, Take 0.5 tablets (12.5 mg total) by mouth 2 (two) times daily., Disp: 30 tablet, Rfl: 0 .  ondansetron (ZOFRAN ODT) 4 MG disintegrating tablet, Take 1 tablet (4 mg total) by mouth every 8 (eight) hours as needed for nausea or vomiting. (Patient not taking: Reported on 11/09/2019), Disp: 30 tablet, Rfl: 0 .  oxyCODONE-acetaminophen (PERCOCET/ROXICET) 5-325 MG tablet, Take 1 tablet by mouth every 4 (four) hours as needed for severe pain. (Patient not taking: Reported on 11/09/2019), Disp: 30 tablet, Rfl: 0 .  tamsulosin (FLOMAX) 0.4 MG CAPS capsule, Take 1 capsule (0.4 mg total) by mouth daily., Disp: 30 capsule, Rfl: 1 .  ticagrelor (BRILINTA) 90 MG TABS tablet, Take 1 tablet (90 mg total) by mouth 2 (two) times daily., Disp: 60 tablet, Rfl: 0  Past Medical History: Past Medical History:  Diagnosis Date  . Cataract   . Hepatitis   . Hyperlipidemia     Tobacco Use: Social History   Tobacco Use  Smoking Status Former Smoker  . Types: Cigarettes  . Quit date: 08/30/1980  . Years since quitting: 39.2  Smokeless Tobacco Never Used     Labs: Recent Chemical engineer    Labs for ITP Cardiac and Pulmonary Rehab Latest Ref Rng & Units 07/14/2015 07/14/2016 07/19/2018 07/31/2019 10/12/2019   Cholestrol 0 - 200 mg/dL 232(H) 213(H) 218(H) 233(H) 231(H)   LDLCALC 0 - 99 mg/dL 151(H) 130(H) 144(H) 157(H) 156(H)   LDLDIRECT mg/dL - - - - -   HDL >40 mg/dL 61.30 67.90 61.60 62.50 67   Trlycerides <150 mg/dL 98.0 76.0 64.0 70.0 41       Exercise Target Goals: Exercise Program Goal: Individual exercise prescription set using results from initial 6 min walk test and THRR while considering  patient's activity barriers and safety.   Exercise Prescription Goal: Initial exercise prescription builds to 30-45 minutes a day of aerobic activity, 2-3 days per week.  Home exercise guidelines will be given to patient during program as part of exercise prescription that the participant will acknowledge.  Activity Barriers & Risk Stratification: Activity Barriers & Cardiac Risk Stratification - 11/12/19 1035      Activity Barriers & Cardiac Risk Stratification   Activity Barriers  Other (comment);Joint Problems;Balance Concerns;Deconditioning;Muscular Weakness    Comments  current kidney stone pain; shoulder pain    Cardiac Risk Stratification  Moderate       6 Minute Walk: 6 Minute Walk    Row Name 11/12/19 1032         6 Minute Walk   Phase  Initial     Distance  1540 feet     Walk Time  6 minutes     # of Rest Breaks  0     MPH  2.92     METS  3.65     RPE  11     VO2 Peak  12.77     Symptoms  No     Resting HR  67 bpm     Resting BP  122/64     Resting Oxygen Saturation   98 %     Exercise Oxygen Saturation  during 6 min walk  95 %     Max Ex. HR  84 bpm     Max Ex. BP  146/74     2 Minute Post BP  104/66        Oxygen Initial Assessment:   Oxygen Re-Evaluation:   Oxygen Discharge (Final Oxygen Re-Evaluation):   Initial Exercise Prescription: Initial Exercise Prescription - 11/12/19 1000       Date of Initial Exercise RX and Referring Provider   Date  11/12/19    Referring Provider  [name]   Primary Cardiologist Dr. [name]     Treadmill   MPH  2.9    Grade  1    Minutes  15    METs  3.6      Elliptical   Level  2    Speed  3.5    Minutes  15    METs  3      REL-XR   Level  3    Speed  50    Minutes  15    METs  3      Prescription Details   Frequency (times per week)  3    Duration  Progress to 30 minutes of continuous aerobic without signs/symptoms of physical distress      Intensity   THRR 40-80% of Max Heartrate  101-135    Ratings of Perceived Exertion  11-13    Perceived Dyspnea  0-4      Progression   Progression  Continue to progress workloads to maintain intensity without signs/symptoms of physical distress.      Resistance Training   Training Prescription  Yes    Weight  3 lb    Reps  10-15       Perform Capillary Blood Glucose checks as needed.  Exercise Prescription Changes: Exercise Prescription Changes    Row Name 11/12/19 1000             Response to Exercise   Blood Pressure (Admit)  122/64       Blood Pressure (Exercise)  146/74       Blood Pressure (Exit)  104/44       Heart Rate (Admit)  67 bpm       Heart Rate (Exercise)  84 bpm       Heart Rate (Exit)  63 bpm       Oxygen Saturation (Admit)  98 %       Oxygen Saturation (Exercise)  95 %       Rating of Perceived Exertion (Exercise)  11       Symptoms  none       Comments  walk test results          Exercise Comments:   Exercise Goals and Review: Exercise Goals    Row Name 11/12/19 1038             Exercise Goals   Increase  Physical Activity  Yes       Intervention  Provide advice, education, support and counseling about physical activity/exercise needs.;Develop an individualized exercise prescription for aerobic and resistive training based on initial evaluation findings, risk stratification, comorbidities and participant's personal  goals.       Expected Outcomes  Short Term: Attend rehab on a regular basis to increase amount of physical activity.;Long Term: Add in home exercise to make exercise part of routine and to increase amount of physical activity.;Long Term: Exercising regularly at least 3-5 days a week.       Increase Strength and Stamina  Yes       Intervention  Provide advice, education, support and counseling about physical activity/exercise needs.;Develop an individualized exercise prescription for aerobic and resistive training based on initial evaluation findings, risk stratification, comorbidities and participant's personal goals.       Expected Outcomes  Short Term: Increase workloads from initial exercise prescription for resistance, speed, and METs.;Short Term: Perform resistance training exercises routinely during rehab and add in resistance training at home;Long Term: Improve cardiorespiratory fitness, muscular endurance and strength as measured by increased METs and functional capacity (6MWT)       Able to understand and use rate of perceived exertion (RPE) scale  Yes       Intervention  Provide education and explanation on how to use RPE scale       Expected Outcomes  Short Term: Able to use RPE daily in rehab to express subjective intensity level;Long Term:  Able to use RPE to guide intensity level when exercising independently       Able to understand and use Dyspnea scale  Yes       Intervention  Provide education and explanation on how to use Dyspnea scale       Expected Outcomes  Short Term: Able to use Dyspnea scale daily in rehab to express subjective sense of shortness of breath during exertion;Long Term: Able to use Dyspnea scale to guide intensity level when exercising independently       Knowledge and understanding of Target Heart Rate Range (THRR)  Yes       Intervention  Provide education and explanation of THRR including how the numbers were predicted and where they are located for reference        Expected Outcomes  Short Term: Able to state/look up THRR;Short Term: Able to use daily as guideline for intensity in rehab;Long Term: Able to use THRR to govern intensity when exercising independently       Able to check pulse independently  Yes       Intervention  Provide education and demonstration on how to check pulse in carotid and radial arteries.;Review the importance of being able to check your own pulse for safety during independent exercise       Expected Outcomes  Short Term: Able to explain why pulse checking is important during independent exercise;Long Term: Able to check pulse independently and accurately       Understanding of Exercise Prescription  Yes       Intervention  Provide education, explanation, and written materials on patient's individual exercise prescription       Expected Outcomes  Short Term: Able to explain program exercise prescription;Long Term: Able to explain home exercise prescription to exercise independently          Exercise Goals Re-Evaluation :   Discharge Exercise Prescription (Final Exercise Prescription Changes): Exercise Prescription Changes - 11/12/19 1000  Response to Exercise   Blood Pressure (Admit)  122/64    Blood Pressure (Exercise)  146/74    Blood Pressure (Exit)  104/44    Heart Rate (Admit)  67 bpm    Heart Rate (Exercise)  84 bpm    Heart Rate (Exit)  63 bpm    Oxygen Saturation (Admit)  98 %    Oxygen Saturation (Exercise)  95 %    Rating of Perceived Exertion (Exercise)  11    Symptoms  none    Comments  walk test results       Nutrition:  Target Goals: Understanding of nutrition guidelines, daily intake of sodium '1500mg'$ , cholesterol '200mg'$ , calories 30% from fat and 7% or less from saturated fats, daily to have 5 or more servings of fruits and vegetables.  Biometrics: Pre Biometrics - 11/12/19 1038      Pre Biometrics   Height  6' 4.4" (1.941 m)    Weight  211 lb 9.6 oz (96 kg)    BMI (Calculated)  25.48     Single Leg Stand  4.5 seconds        Nutrition Therapy Plan and Nutrition Goals:   Nutrition Assessments: Nutrition Assessments - 11/12/19 1041      MEDFICTS Scores   Pre Score  42       Nutrition Goals Re-Evaluation:   Nutrition Goals Discharge (Final Nutrition Goals Re-Evaluation):   Psychosocial: Target Goals: Acknowledge presence or absence of significant depression and/or stress, maximize coping skills, provide positive support system. Participant is able to verbalize types and ability to use techniques and skills needed for reducing stress and depression.   Initial Review & Psychosocial Screening: Initial Psych Review & Screening - 11/09/19 1011      Initial Review   Current issues with  Current Stress Concerns    Comments  current kidney stone, president of church council, sons out state      [place]?  Yes   wife     Barriers   Psychosocial barriers to participate in program  There are no identifiable barriers or psychosocial needs.;The patient should benefit from training in stress management and relaxation.      Screening Interventions   Interventions  Encouraged to exercise;To provide support and resources with identified psychosocial needs    Expected Outcomes  Short Term goal: Utilizing psychosocial counselor, staff and physician to assist with identification of specific Stressors or current issues interfering with healing process. Setting desired goal for each stressor or current issue identified.;Long Term Goal: Stressors or current issues are controlled or eliminated.;Short Term goal: Identification and review with participant of any Quality of Life or Depression concerns found by scoring the questionnaire.;Long Term goal: The participant improves quality of Life and PHQ9 Scores as seen by post scores and/or verbalization of changes       Quality of Life Scores:  Quality of Life - 11/12/19 1041      Quality of Life   Select   Quality of Life      Quality of Life Scores   Health/Function Pre  23 %    Socioeconomic Pre  24.67 %    Psych/Spiritual Pre  22.86 %    Family Pre  27.6 %    GLOBAL Pre  23.97 %      Scores of 19 and below usually indicate a poorer quality of life in these areas.  A difference of  2-3 points is a clinically meaningful difference.  A difference of 2-3 points in the total score of the Quality of Life Index has been associated with significant improvement in overall quality of life, self-image, physical symptoms, and general health in studies assessing change in quality of life.  PHQ-9: Recent Review Flowsheet Data    Depression screen [facility] 2/9 11/12/2019 10/19/2019 07/31/2019 07/19/2018 07/15/2017   Decreased Interest 1 0 0 0 0   Down, Depressed, Hopeless 0 0 0 0 0   PHQ - 2 Score 1 0 0 0 0   Altered sleeping 0 - - - -   Tired, decreased energy 1 - - - -   Change in appetite 0 - - - -   Feeling bad or failure about yourself  0 - - - -   Trouble concentrating 0 - - - -   Moving slowly or fidgety/restless 0 - - - -   Suicidal thoughts 0 - - - -   PHQ-9 Score 2 - - - -   Difficult doing work/chores Not difficult at all - - - -     Interpretation of Total Score  Total Score Depression Severity:  1-4 = Minimal depression, 5-9 = Mild depression, 10-14 = Moderate depression, 15-19 = Moderately severe depression, 20-27 = Severe depression   Psychosocial Evaluation and Intervention: Psychosocial Evaluation - 11/09/19 1017      Psychosocial Evaluation & Interventions   Comments  Edric is a very active person. He is wanting to get back to [facility] [employer]. His wife is very supportive. He reports sleeping well, although he is currently dealing with a kidney stone that he has not passed for a few weeks. Due to his blood thinner post MI, he can't have surgery to remove it. 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    Expected Outcomes  Short: attend [facility] for exercise and education. Long: maintain positive self care habits    Continue Psychosocial Services   Follow up required by staff       Psychosocial Re-Evaluation:   Psychosocial Discharge (Final Psychosocial Re-Evaluation):   Vocational Rehabilitation: Provide vocational rehab assistance to qualifying candidates.   Vocational Rehab Evaluation & Intervention: Vocational Rehab - 11/09/19 1011      Initial Vocational Rehab Evaluation & Intervention   Assessment shows need for Vocational Rehabilitation  No       Education: Education Goals: Education classes will be provided on a variety of topics geared toward better understanding of heart health and risk factor modification. Participant will state understanding/return demonstration of topics presented as noted by education test scores.  Learning Barriers/Preferences: Learning Barriers/Preferences - 11/09/19 1011      Learning Barriers/Preferences   Learning Barriers  None    Learning Preferences  None       Education Topics:  AED/CPR: - Group verbal and written instruction with the use of models to demonstrate the basic use of the AED with the basic ABC's of resuscitation.   General Nutrition Guidelines/Fats and Fiber: -Group instruction provided by verbal, written material, models and posters to present the general guidelines for heart healthy nutrition. Gives an explanation and review of dietary fats and fiber.   Controlling Sodium/Reading Food Labels: -Group verbal and written material supporting the discussion of sodium use in heart healthy nutrition. Review and explanation with models, verbal and written materials for utilization of the food label.   Exercise  Physiology & General Exercise Guidelines: - Group verbal and written instruction with models to review the exercise physiology of the cardiovascular system and associated critical  values. Provides general exercise guidelines with specific guidelines to those with heart or lung disease.    Aerobic Exercise & Resistance Training: - Gives group verbal and written instruction on the various components of exercise. Focuses on aerobic and resistive training programs and the benefits of this training and how to safely progress through these programs..   Flexibility, Balance, Mind/Body Relaxation: Provides group verbal/written instruction on the benefits of flexibility and balance training, including mind/body exercise modes such as yoga, pilates and tai chi.  Demonstration and skill practice provided.   Stress and Anxiety: - Provides group verbal and written instruction about the health risks of elevated stress and causes of high stress.  Discuss the correlation between heart/lung disease and anxiety and treatment options. Review healthy ways to manage with stress and anxiety.   Depression: - Provides group verbal and written instruction on the correlation between heart/lung disease and depressed mood, treatment options, and the stigmas associated with seeking treatment.   Anatomy & Physiology of the Heart: - Group verbal and written instruction and models provide basic cardiac anatomy and physiology, with the coronary electrical and arterial systems. Review of Valvular disease and Heart Failure   Cardiac Procedures: - Group verbal and written instruction to review commonly prescribed medications for heart disease. Reviews the medication, class of the drug, and side effects. Includes the steps to properly store meds and maintain the prescription regimen. (beta blockers and nitrates)   Cardiac Medications I: - Group verbal and written instruction to review commonly prescribed medications for heart disease. Reviews the medication, class of the drug, and side effects. Includes the steps to properly store meds and maintain the prescription regimen.   Cardiac Medications  II: -Group verbal and written instruction to review commonly prescribed medications for heart disease. Reviews the medication, class of the drug, and side effects. (all other drug classes)    Go Sex-Intimacy & Heart Disease, Get SMART - Goal Setting: - Group verbal and written instruction through game format to discuss heart disease and the return to sexual intimacy. Provides group verbal and written material to discuss and apply goal setting through the application of the S.M.A.R.T. Method.   Other Matters of the Heart: - Provides group verbal, written materials and models to describe Stable Angina and Peripheral Artery. Includes description of the disease process and treatment options available to the cardiac patient.   Exercise & Equipment Safety: - Individual verbal instruction and demonstration of equipment use and safety with use of the equipment.   Infection Prevention: - Provides verbal and written material to individual with discussion of infection control including proper hand washing and proper equipment cleaning during exercise session.   Falls Prevention: - Provides verbal and written material to individual with discussion of falls prevention and safety.   Diabetes: - Individual verbal and written instruction to review signs/symptoms of diabetes, desired ranges of glucose level fasting, after meals and with exercise. Acknowledge that pre and post exercise glucose checks will be done for 3 sessions at entry of program.   Know Your Numbers and Risk Factors: -Group verbal and written instruction about important numbers in your health.  Discussion of what are risk factors and how they play a role in the disease process.  Review of Cholesterol, Blood Pressure, Diabetes, and BMI and the role they play in your overall health.  Sleep Hygiene: -Provides group verbal and written instruction about how sleep can affect your health.  Define sleep hygiene, discuss sleep cycles and  impact of sleep habits. Review good sleep hygiene tips.    Other: -Provides group and verbal instruction on various topics (see comments)   Knowledge Questionnaire Score: Knowledge Questionnaire Score - 11/12/19 1041      Knowledge Questionnaire Score   Pre Score  25/26 only missed pulse question       Core Components/Risk Factors/Patient Goals at Admission: Personal Goals and Risk Factors at Admission - 11/12/19 1042      Core Components/Risk Factors/Patient Goals on Admission    Weight Management  Yes;Weight Loss    Intervention  Weight Management: Develop a combined nutrition and exercise program designed to reach desired caloric intake, while maintaining appropriate intake of nutrient and fiber, sodium and fats, and appropriate energy expenditure required for the weight goal.;Weight Management: Provide education and appropriate resources to help participant work on and attain dietary goals.    Admit Weight  211 lb 9.6 oz (96 kg)    Goal Weight: Short Term  206 lb (93.4 kg)    Goal Weight: Long Term  200 lb (90.7 kg)    Expected Outcomes  Long Term: Adherence to nutrition and physical activity/exercise program aimed toward attainment of established weight goal;Short Term: Continue to assess and modify interventions until short term weight is achieved;Weight Loss: Understanding of general recommendations for a balanced deficit meal plan, which promotes 1-2 lb weight loss per week and includes a negative energy balance of [phone] kcal/d;Understanding of distribution of calorie intake throughout the day with the consumption of 4-5 meals/snacks;Understanding recommendations for meals to include 15-35% energy as protein, 25-35% energy from fat, 35-60% energy from carbohydrates, less than 267m of dietary cholesterol, 20-35 gm of total fiber daily    Hypertension  Yes    Intervention  Provide education on lifestyle modifcations including regular physical activity/exercise, weight management,  moderate sodium restriction and increased consumption of fresh fruit, vegetables, and low fat dairy, alcohol moderation, and smoking cessation.;Monitor prescription use compliance.    Expected Outcomes  Long Term: Maintenance of blood pressure at goal levels.;Short Term: Continued assessment and intervention until BP is < 140/925mHG in hypertensive participants. < 130/808mG in hypertensive participants with diabetes, heart failure or chronic kidney disease.    Lipids  Yes    Intervention  Provide education and support for participant on nutrition & aerobic/resistive exercise along with prescribed medications to achieve LDL <62m49mDL >40mg6m Expected Outcomes  Short Term: Participant states understanding of desired cholesterol values and is compliant with medications prescribed. Participant is following exercise prescription and nutrition guidelines.;Long Term: Cholesterol controlled with medications as prescribed, with individualized exercise RX and with personalized nutrition plan. Value goals: LDL < 62mg,21m > 40 mg.       Core Components/Risk Factors/Patient Goals Review:    Core Components/Risk Factors/Patient Goals at Discharge (Final Review):    ITP Comments: ITP Comments    Row Name 11/09/19 1008 11/12/19 1029         ITP Comments  Initial phone orientation completed. Diagnosis can be found in [facility] EP orientation scheduled for 3/15 at 8am  Completed 6MWT and gym orientation.  Initial ITP created and sent for review to Dr. Mark MEmily Filbertcal Director.         Comments: Initial ITP

## 2019-11-14 ENCOUNTER — Ambulatory Visit (INDEPENDENT_AMBULATORY_CARE_PROVIDER_SITE_OTHER): Payer: Medicare Other | Admitting: Internal Medicine

## 2019-11-14 ENCOUNTER — Encounter: Payer: Medicare Other | Admitting: *Deleted

## 2019-11-14 ENCOUNTER — Encounter: Payer: Self-pay | Admitting: Internal Medicine

## 2019-11-14 ENCOUNTER — Other Ambulatory Visit: Payer: Self-pay

## 2019-11-14 VITALS — BP 108/66 | HR 66 | Temp 98.3°F | Wt 214.0 lb

## 2019-11-14 DIAGNOSIS — N201 Calculus of ureter: Secondary | ICD-10-CM

## 2019-11-14 DIAGNOSIS — E782 Mixed hyperlipidemia: Secondary | ICD-10-CM | POA: Insufficient documentation

## 2019-11-14 DIAGNOSIS — Z955 Presence of coronary angioplasty implant and graft: Secondary | ICD-10-CM

## 2019-11-14 DIAGNOSIS — I2510 Atherosclerotic heart disease of native coronary artery without angina pectoris: Secondary | ICD-10-CM | POA: Diagnosis not present

## 2019-11-14 DIAGNOSIS — I10 Essential (primary) hypertension: Secondary | ICD-10-CM | POA: Insufficient documentation

## 2019-11-14 DIAGNOSIS — I214 Non-ST elevation (NSTEMI) myocardial infarction: Secondary | ICD-10-CM

## 2019-11-14 DIAGNOSIS — R9389 Abnormal findings on diagnostic imaging of other specified body structures: Secondary | ICD-10-CM | POA: Diagnosis not present

## 2019-11-14 MED ORDER — TICAGRELOR 90 MG PO TABS: 90.0000 mg | ORAL_TABLET | Freq: Two times a day (BID) | ORAL | 1 refills | Status: AC

## 2019-11-14 NOTE — Assessment & Plan Note (Signed)
Symptoms resolved but he hasn't clearly passed the stone Dr Bernardo Heater will be rechecking soon On the flomax

## 2019-11-14 NOTE — Progress Notes (Signed)
Daily Session Note  Patient Details  Name: Philip Mack MRN: [id number] Date of Birth: 10-06-1950 Referring Provider:     Cardiac Rehab from 11/12/2019 in [facility] Cardiac and Pulmonary Rehab  Referring Provider  [name] [facility] Cardiologist Dr. [name]      Encounter Date: 11/14/2019  Check In: Session Check In - 11/14/19 0858      Check-In   Supervising physician immediately available to respond to emergencies  See telemetry face sheet for immediately available ER MD    Location  [facility]-Cardiac & Pulmonary Rehab    Staff Present  [name], RN, BSN, CCRP;Joseph Foy [place], [place], ACSM CEP, Exercise Physiologist    Virtual Visit  No    Medication changes reported      No    Fall or balance concerns reported     No    Warm-up and Cool-down  Performed on first and last piece of equipment    Resistance Training Performed  Yes    VAD Patient?  No    PAD/SET Patient?  No      Pain Assessment   Currently in Pain?  No/denies          Social History   Tobacco Use  Smoking Status Former Smoker  . Types: Cigarettes  . Quit date: 08/30/1980  . Years since quitting: 39.2  Smokeless Tobacco Never Used    Goals Met:  Exercise tolerated well Personal goals reviewed No report of cardiac concerns or symptoms  Goals Unmet:  Not Applicable  Comments: First full day of exercise!  Patient was oriented to gym and equipment including functions, settings, policies, and procedures.  Patient's individual exercise prescription and treatment plan were reviewed.  All starting workloads were established based on the results of the 6 minute walk test done at initial orientation visit.  The plan for exercise progression was also introduced and progression will be customized based on patient's performance and goals.    [name] is Medical Director for [place] and [facility].

## 2019-11-14 NOTE — Assessment & Plan Note (Signed)
Indeterminate iliac lesion Probably incidental finding Recent normal PSA Recommended MRI--will hold off and reconsider (or just recheck x-ray at next visit)

## 2019-11-14 NOTE — Progress Notes (Signed)
Subjective:    Patient ID: Philip Mack, male    DOB: 1951-04-29, 69 y.o.   MRN: [id number]  HPI Here for hospital follow up This visit occurred during the SARS-CoV-2 public health emergency.  Safety protocols were in place, including screening questions prior to the visit, additional usage of staff PPE, and extensive cleaning of exam room while observing appropriate contact time as indicated for disinfecting solutions.   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 aspirin 81 MG chewable tablet Chew 1 tablet (81 mg total) by mouth daily.    Marland Kitchen atorvastatin (LIPITOR) 80 MG tablet Take 1 tablet by mouth daily.    . metoprolol tartrate (LOPRESSOR) 25 MG tablet Take 0.5 tablets (12.5 mg total) by mouth 2 (two) times daily. 30 tablet 0  . oxyCODONE-acetaminophen (PERCOCET/ROXICET) 5-325 MG tablet Take 1 tablet by mouth every 4 (four) hours as needed for severe pain. 30 tablet 0  . tamsulosin (FLOMAX) 0.4 MG CAPS capsule Take 1 capsule (0.4 mg total) by mouth daily. 30 capsule 1  . ticagrelor (BRILINTA) 90 MG TABS tablet Take 1 tablet (90 mg total) by mouth 2 (two) times daily. 60 tablet 0  . ondansetron (ZOFRAN ODT) 4 MG disintegrating tablet Take 1 tablet (4 mg total) by mouth every 8 (eight) hours as needed for nausea or vomiting. (Patient not taking: Reported on 11/14/2019) 30 tablet 0   No current facility-administered medications on file prior to visit.    No Known Allergies  Past Medical History:  Diagnosis Date  . Cataract   . Hepatitis   . Hyperlipidemia     Past  Surgical History:  Procedure Laterality Date  . Arm injury  1975  . COLONOSCOPY    . CORONARY STENT INTERVENTION N/A 10/15/2019   Procedure: CORONARY STENT INTERVENTION;  Surgeon: Yolonda Kida, MD;  Location: [facility];  Service: Cardiovascular;  Laterality: N/A;  . HERNIA REPAIR  1959   Bilateral, inguinal  . LEFT HEART CATH AND CORONARY ANGIOGRAPHY N/A 10/15/2019   Procedure: LEFT HEART CATH AND CORONARY ANGIOGRAPHY;  Surgeon: Corey Skains, MD;  Location: [facility];  Service: Cardiovascular;  Laterality: N/A;  . POLYPECTOMY    . TONSILLECTOMY AND ADENOIDECTOMY  1961    Family History  Problem Relation Age of Onset  . Cancer Mother        Lung, (surgically resected)  . Heart disease Mother   . Hearing loss Other        On Mom's side  . Hypertension Neg Hx   . Diabetes Neg Hx   . Colon cancer Neg Hx   . Colon polyps Neg Hx   . Stomach cancer Neg Hx   . Rectal cancer Neg Hx     Social History   Socioeconomic History  . Marital status: Married    Spouse name: Not on file  . Number of children: 2  . Years of education: Not on file  . Highest education level: Not on file  Occupational History  . Occupation: Retired Psychologist, sport and exercise  Tobacco Use  . Smoking status: Former Smoker    Types: Cigarettes    Quit date: 08/30/1980    Years since quitting: 39.2  . Smokeless tobacco: Never Used  Substance and Sexual Activity  . Alcohol use: Yes    Comment: Occasionally  . Drug use: No  . Sexual activity: Not on file  Other Topics Concern  . Not on file  Social History Narrative   Has living will   Wife is health care POA-- then son Randall Hiss   Would accept resuscitation   No tube feeds if cognitively unaware   Social Determinants of Health   Financial Resource Strain:   . Difficulty of Paying Living Expenses:   Food Insecurity: No Food Insecurity  . Worried About Charity fundraiser in the Last Year: Never true  . Ran Out of Food in the Last  Year: Never true  Transportation Needs: No Transportation Needs  . Lack of Transportation (Medical): No  . Lack of Transportation (Non-Medical): No  Physical Activity:   . Days of Exercise per Week:   . Minutes of Exercise per Session:   Stress:   . Feeling of Stress :   Social Connections:   . Frequency of Communication with Friends and Family:   . Frequency of Social Gatherings with Friends and Family:   . Attends Religious Services:   . Active Member of Clubs or Organizations:   . Attends Archivist Meetings:   Marland Kitchen Marital Status:   Intimate Partner Violence:   . Fear of Current or Ex-Partner:   . Emotionally Abused:   Marland Kitchen Physically Abused:   . Sexually Abused:    Review of Systems     Objective:   Physical Exam  Constitutional: He appears well-developed. No distress.  Neck: No thyromegaly present.  Cardiovascular: Normal rate, regular rhythm and normal heart sounds. Exam reveals no gallop.  No murmur heard. Respiratory: Effort normal and breath sounds normal. No respiratory distress. He has no wheezes. He has no rales.  GI: Soft. He exhibits no distension. There is no abdominal tenderness. There is no rebound and no guarding.  Musculoskeletal:        General: No edema.     Comments: No CVA tenderness  Lymphadenopathy:    He has no cervical adenopathy.  Psychiatric: He has a normal mood and affect. His behavior is normal.           Assessment & Plan:

## 2019-11-14 NOTE — Assessment & Plan Note (Signed)
Doing well after LAD stent Just started cardiac rehab On brilinta--will refill for Dr Nehemiah Massed since he can't get response from him Diona Fanti, statin

## 2019-11-15 NOTE — Telephone Encounter (Signed)
-----   Message from Abbie Sons, MD sent at 11/14/2019  3:56 PM EDT ----- Regarding: RE: ESWL Unfortunately our hands are tied until he can be cleared by cardiology.  If he is having pain he should be okay to stent under sedation. ----- Message ----- From: Ranell Patrick, RN Sent: 11/13/2019   9:19 AM EDT To: Abbie Sons, MD Subject: ESWL                                           Wanted to make sure you saw this note. ----- Message ----- From: Ranell Patrick, RN Sent: 11/12/2019   1:20 PM EDT To: Abbie Sons, MD Subject: ESWL                                           This patient had a recent MI and is being seen by Dr Nehemiah Massed. The patient told me he had an appt with Dr Nehemiah Massed on 3/12 and could be cleared after that appt. That was actually an appt with Cardiac Rehab. He is scheduled for testing on 4/8 with f/u with Dr Nehemiah Massed on 4/22. What should we do about his kidney stone? I didn't want to call the patient until I talked to you.

## 2019-11-15 NOTE — Telephone Encounter (Signed)
Patient also states Dr Nehemiah Massed recommends continuing blood thinners for at least 3 months. Will follow up with patient near that time.

## 2019-11-15 NOTE — Telephone Encounter (Signed)
Patient denies pain in the last 2 weeks but has been straining urine and hasn't seen a stone pass. Also denies nausea, vomiting or fever. Advised patient that if he develops symptoms to call office or go to the emergency room. Explained that per Dr Bernardo Heater unfortunately our hands are tied until he can be cleared by cardiology.  If he is having pain he should be okay to stent under sedation. Questions answered to patient's satisfaction and he verbalized understanding.

## 2019-11-15 NOTE — Telephone Encounter (Signed)
If he can get cleared for IV sedation we could potentially do lithotripsy since his stone is in the distal ureter.  Would recommend another KUB to see if there has been any stone progression.

## 2019-11-16 ENCOUNTER — Encounter: Payer: Medicare Other | Admitting: *Deleted

## 2019-11-16 ENCOUNTER — Other Ambulatory Visit: Payer: Self-pay

## 2019-11-16 DIAGNOSIS — I214 Non-ST elevation (NSTEMI) myocardial infarction: Secondary | ICD-10-CM

## 2019-11-16 DIAGNOSIS — Z955 Presence of coronary angioplasty implant and graft: Secondary | ICD-10-CM | POA: Diagnosis not present

## 2019-11-16 NOTE — Telephone Encounter (Signed)
Per Claiborne Billings, Dr Nehemiah Massed would like to do further testing before clearing for moderate sedation. He is scheduled for testing and results with Dr Nehemiah Massed on 12/06/2019. Their office will be in contact after testing. Dr Bernardo Heater - Do you recommend doing the KUB now or after he is cleared?

## 2019-11-16 NOTE — Progress Notes (Signed)
Daily Session Note  Patient Details  Name: Argil Mahl MRN: [id number] Date of Birth: 12-18-1950 Referring Provider:     Cardiac Rehab from 11/12/2019 in [facility] Cardiac and Pulmonary Rehab  Referring Provider  [name] [facility] Cardiologist Dr. [name]      Encounter Date: 11/16/2019  Check In: Session Check In - 11/16/19 1105      Check-In   Supervising physician immediately available to respond to emergencies  See telemetry face sheet for immediately available ER MD    Location  [facility]-Cardiac & Pulmonary Rehab    Staff Present  [name], RN, BSN, CCRP;Joseph Hood RCP,RRT,BSRT;Jessica [place], [place], [place], [place], CCET    Virtual Visit  No    Medication changes reported      No    Fall or balance concerns reported     No    Warm-up and Cool-down  Performed on first and last piece of equipment    Resistance Training Performed  Yes    VAD Patient?  No    PAD/SET Patient?  No      Pain Assessment   Currently in Pain?  No/denies          Social History   Tobacco Use  Smoking Status Former Smoker  . Types: Cigarettes  . Quit date: 08/30/1980  . Years since quitting: 39.2  Smokeless Tobacco Never Used    Goals Met:  Independence with exercise equipment Exercise tolerated well No report of cardiac concerns or symptoms Strength training completed today  Goals Unmet:  Not Applicable  Comments: Pt able to follow exercise prescription today without complaint.  Will continue to monitor for progression.    [name] is Medical Director for [place] and [facility].

## 2019-11-19 ENCOUNTER — Other Ambulatory Visit: Payer: Self-pay

## 2019-11-19 ENCOUNTER — Encounter: Payer: Medicare Other | Admitting: *Deleted

## 2019-11-19 DIAGNOSIS — Z955 Presence of coronary angioplasty implant and graft: Secondary | ICD-10-CM

## 2019-11-19 DIAGNOSIS — I214 Non-ST elevation (NSTEMI) myocardial infarction: Secondary | ICD-10-CM

## 2019-11-19 NOTE — Progress Notes (Signed)
Daily Session Note  Patient Details  Name: Philip Mack MRN: [id number] Date of Birth: [birth year] Referring Provider:     Cardiac Rehab from 11/12/2019 in [facility] Cardiac and Pulmonary Rehab  Referring Provider  [name] [facility] Cardiologist Dr. [name]      Encounter Date: 11/19/2019  Check In: Session Check In - 11/19/19 0815      Check-In   Supervising physician immediately available to respond to emergencies  See telemetry face sheet for immediately available ER MD    Location  [facility]-Cardiac & Pulmonary Rehab    Staff Present  [name], RN, BSN, CCRP;Joseph Hood RCP,RRT,BSRT;Kelly [place], [place], ACSM CEP, Exercise Physiologist    Virtual Visit  No    Medication changes reported      No    Fall or balance concerns reported     No    Warm-up and Cool-down  Performed on first and last piece of equipment    Resistance Training Performed  Yes    VAD Patient?  No    PAD/SET Patient?  No      Pain Assessment   Currently in Pain?  No/denies          Social History   Tobacco Use  Smoking Status Former Smoker  . Types: Cigarettes  . Quit date: 08/30/1980  . Years since quitting: 39.2  Smokeless Tobacco Never Used    Goals Met:  Independence with exercise equipment Exercise tolerated well No report of cardiac concerns or symptoms  Goals Unmet:  Not Applicable  Comments: Pt able to follow exercise prescription today without complaint.  Will continue to monitor for progression.    [name] is Medical Director for [place] and [facility].

## 2019-11-21 ENCOUNTER — Encounter: Payer: Medicare Other | Admitting: *Deleted

## 2019-11-21 ENCOUNTER — Encounter: Payer: Self-pay | Admitting: *Deleted

## 2019-11-21 ENCOUNTER — Other Ambulatory Visit: Payer: Self-pay

## 2019-11-21 DIAGNOSIS — I214 Non-ST elevation (NSTEMI) myocardial infarction: Secondary | ICD-10-CM | POA: Diagnosis not present

## 2019-11-21 DIAGNOSIS — Z955 Presence of coronary angioplasty implant and graft: Secondary | ICD-10-CM | POA: Diagnosis not present

## 2019-11-21 NOTE — Progress Notes (Signed)
Cardiac Individual Treatment Plan  Patient Details  Name: Philip Mack MRN: [id number] Date of Birth: 09-May-1951 Referring Provider:     Cardiac Rehab from 11/12/2019 in [facility] Cardiac and Pulmonary Rehab  Referring Provider  [name] [facility] Cardiologist Dr. [name]      Initial Encounter Date:    Cardiac Rehab from 11/12/2019 in [facility] Cardiac and Pulmonary Rehab  Date  11/12/19      Visit Diagnosis: NSTEMI (non-ST elevated myocardial infarction) [facility])  Status post coronary artery stent placement  Patient's Home Medications on Admission:  Current Outpatient Medications:  .  aspirin 81 MG chewable tablet, Chew 1 tablet (81 mg total) by mouth daily., Disp:  , Rfl:  .  atorvastatin (LIPITOR) 80 MG tablet, Take 1 tablet by mouth daily., Disp: , Rfl:  .  metoprolol tartrate (LOPRESSOR) 25 MG tablet, Take 0.5 tablets (12.5 mg total) by mouth 2 (two) times daily., Disp: 30 tablet, Rfl: 0 .  ondansetron (ZOFRAN ODT) 4 MG disintegrating tablet, Take 1 tablet (4 mg total) by mouth every 8 (eight) hours as needed for nausea or vomiting. (Patient not taking: Reported on 11/14/2019), Disp: 30 tablet, Rfl: 0 .  oxyCODONE-acetaminophen (PERCOCET/ROXICET) 5-325 MG tablet, Take 1 tablet by mouth every 4 (four) hours as needed for severe pain., Disp: 30 tablet, Rfl: 0 .  tamsulosin (FLOMAX) 0.4 MG CAPS capsule, Take 1 capsule (0.4 mg total) by mouth daily., Disp: 30 capsule, Rfl: 1 .  ticagrelor (BRILINTA) 90 MG TABS tablet, Take 1 tablet (90 mg total) by mouth 2 (two) times daily., Disp: 60 tablet, Rfl: 1  Past Medical History: Past Medical History:  Diagnosis Date  . Cataract   . Hepatitis   . Hyperlipidemia     Tobacco Use: Social History   Tobacco Use  Smoking Status Former Smoker  . Types: Cigarettes  . Quit date: 08/30/1980  . Years since quitting: 39.2  Smokeless Tobacco Never Used    Labs: Recent Chemical engineer    Labs for ITP Cardiac and Pulmonary  Rehab Latest Ref Rng & Units 07/14/2015 07/14/2016 07/19/2018 07/31/2019 10/12/2019   Cholestrol 0 - 200 mg/dL 232(H) 213(H) 218(H) 233(H) 231(H)   LDLCALC 0 - 99 mg/dL 151(H) 130(H) 144(H) 157(H) 156(H)   LDLDIRECT mg/dL - - - - -   HDL >40 mg/dL 61.30 67.90 61.60 62.50 67   Trlycerides <150 mg/dL 98.0 76.0 64.0 70.0 41       Exercise Target Goals: Exercise Program Goal: Individual exercise prescription set using results from initial 6 min walk test and THRR while considering  patient's activity barriers and safety.   Exercise Prescription Goal: Initial exercise prescription builds to 30-45 minutes a day of aerobic activity, 2-3 days per week.  Home exercise guidelines will be given to patient during program as part of exercise prescription that the participant will acknowledge.  Activity Barriers & Risk Stratification: Activity Barriers & Cardiac Risk Stratification - 11/12/19 1035      Activity Barriers & Cardiac Risk Stratification   Activity Barriers  Other (comment);Joint Problems;Balance Concerns;Deconditioning;Muscular Weakness    Comments  current kidney stone pain; shoulder pain    Cardiac Risk Stratification  Moderate       6 Minute Walk: 6 Minute Walk    Row Name 11/12/19 1032         6 Minute Walk   Phase  Initial     Distance  1540 feet     Walk Time  6 minutes     #  of Rest Breaks  0     MPH  2.92     METS  3.65     RPE  11     VO2 Peak  12.77     Symptoms  No     Resting HR  67 bpm     Resting BP  122/64     Resting Oxygen Saturation   98 %     Exercise Oxygen Saturation  during 6 min walk  95 %     Max Ex. HR  84 bpm     Max Ex. BP  146/74     2 Minute Post BP  104/66        Oxygen Initial Assessment:   Oxygen Re-Evaluation:   Oxygen Discharge (Final Oxygen Re-Evaluation):   Initial Exercise Prescription: Initial Exercise Prescription - 11/12/19 1000      Date of Initial Exercise RX and Referring Provider   Date  11/12/19    Referring  Provider  [name]   Primary Cardiologist Dr. [name]     Treadmill   MPH  2.9    Grade  1    Minutes  15    METs  3.6      Elliptical   Level  2    Speed  3.5    Minutes  15    METs  3      REL-XR   Level  3    Speed  50    Minutes  15    METs  3      Prescription Details   Frequency (times per week)  3    Duration  Progress to 30 minutes of continuous aerobic without signs/symptoms of physical distress      Intensity   THRR 40-80% of Max Heartrate  101-135    Ratings of Perceived Exertion  11-13    Perceived Dyspnea  0-4      Progression   Progression  Continue to progress workloads to maintain intensity without signs/symptoms of physical distress.      Resistance Training   Training Prescription  Yes    Weight  3 lb    Reps  10-15       Perform Capillary Blood Glucose checks as needed.  Exercise Prescription Changes: Exercise Prescription Changes    Row Name 11/12/19 1000             Response to Exercise   Blood Pressure (Admit)  122/64       Blood Pressure (Exercise)  146/74       Blood Pressure (Exit)  104/44       Heart Rate (Admit)  67 bpm       Heart Rate (Exercise)  84 bpm       Heart Rate (Exit)  63 bpm       Oxygen Saturation (Admit)  98 %       Oxygen Saturation (Exercise)  95 %       Rating of Perceived Exertion (Exercise)  11       Symptoms  none       Comments  walk test results          Exercise Comments: Exercise Comments    Row Name 11/14/19 0900           Exercise Comments  First full day of exercise!  Patient was oriented to gym and equipment including functions, settings, policies, and procedures.  Patient's individual exercise prescription and treatment  plan were reviewed.  All starting workloads were established based on the results of the 6 minute walk test done at initial orientation visit.  The plan for exercise progression was also introduced and progression will be customized based on patient's  performance and goals.          Exercise Goals and Review: Exercise Goals    Row Name 11/12/19 1038             Exercise Goals   Increase Physical Activity  Yes       Intervention  Provide advice, education, support and counseling about physical activity/exercise needs.;Develop an individualized exercise prescription for aerobic and resistive training based on initial evaluation findings, risk stratification, comorbidities and participant's personal goals.       Expected Outcomes  Short Term: Attend rehab on a regular basis to increase amount of physical activity.;Long Term: Add in home exercise to make exercise part of routine and to increase amount of physical activity.;Long Term: Exercising regularly at least 3-5 days a week.       Increase Strength and Stamina  Yes       Intervention  Provide advice, education, support and counseling about physical activity/exercise needs.;Develop an individualized exercise prescription for aerobic and resistive training based on initial evaluation findings, risk stratification, comorbidities and participant's personal goals.       Expected Outcomes  Short Term: Increase workloads from initial exercise prescription for resistance, speed, and METs.;Short Term: Perform resistance training exercises routinely during rehab and add in resistance training at home;Long Term: Improve cardiorespiratory fitness, muscular endurance and strength as measured by increased METs and functional capacity (6MWT)       Able to understand and use rate of perceived exertion (RPE) scale  Yes       Intervention  Provide education and explanation on how to use RPE scale       Expected Outcomes  Short Term: Able to use RPE daily in rehab to express subjective intensity level;Long Term:  Able to use RPE to guide intensity level when exercising independently       Able to understand and use Dyspnea scale  Yes       Intervention  Provide education and explanation on how to use Dyspnea  scale       Expected Outcomes  Short Term: Able to use Dyspnea scale daily in rehab to express subjective sense of shortness of breath during exertion;Long Term: Able to use Dyspnea scale to guide intensity level when exercising independently       Knowledge and understanding of Target Heart Rate Range (THRR)  Yes       Intervention  Provide education and explanation of THRR including how the numbers were predicted and where they are located for reference       Expected Outcomes  Short Term: Able to state/look up THRR;Short Term: Able to use daily as guideline for intensity in rehab;Long Term: Able to use THRR to govern intensity when exercising independently       Able to check pulse independently  Yes       Intervention  Provide education and demonstration on how to check pulse in carotid and radial arteries.;Review the importance of being able to check your own pulse for safety during independent exercise       Expected Outcomes  Short Term: Able to explain why pulse checking is important during independent exercise;Long Term: Able to check pulse independently and accurately       Understanding  of Exercise Prescription  Yes       Intervention  Provide education, explanation, and written materials on patient's individual exercise prescription       Expected Outcomes  Short Term: Able to explain program exercise prescription;Long Term: Able to explain home exercise prescription to exercise independently          Exercise Goals Re-Evaluation : Exercise Goals Re-Evaluation    Row Name 11/14/19 0900             Exercise Goal Re-Evaluation   Exercise Goals Review  Able to understand and use rate of perceived exertion (RPE) scale;Knowledge and understanding of Target Heart Rate Range (THRR);Understanding of Exercise Prescription       Comments  Reviewed RPE scale, THR and program prescription with pt today.  Pt voiced understanding and was given a copy of goals to take home.       Expected  Outcomes  Short: Use RPE daily to regulate intensity. Long: Follow program prescription in THR.          Discharge Exercise Prescription (Final Exercise Prescription Changes): Exercise Prescription Changes - 11/12/19 1000      Response to Exercise   Blood Pressure (Admit)  122/64    Blood Pressure (Exercise)  146/74    Blood Pressure (Exit)  104/44    Heart Rate (Admit)  67 bpm    Heart Rate (Exercise)  84 bpm    Heart Rate (Exit)  63 bpm    Oxygen Saturation (Admit)  98 %    Oxygen Saturation (Exercise)  95 %    Rating of Perceived Exertion (Exercise)  11    Symptoms  none    Comments  walk test results       Nutrition:  Target Goals: Understanding of nutrition guidelines, daily intake of sodium <1567m, cholesterol <2035m calories 30% from fat and 7% or less from saturated fats, daily to have 5 or more servings of fruits and vegetables.  Biometrics: Pre Biometrics - 11/12/19 1038      Pre Biometrics   Height  6' 4.4" (1.941 m)    Weight  211 lb 9.6 oz (96 kg)    BMI (Calculated)  25.48    Single Leg Stand  4.5 seconds        Nutrition Therapy Plan and Nutrition Goals:   Nutrition Assessments: Nutrition Assessments - 11/12/19 1041      MEDFICTS Scores   Pre Score  42       Nutrition Goals Re-Evaluation:   Nutrition Goals Discharge (Final Nutrition Goals Re-Evaluation):   Psychosocial: Target Goals: Acknowledge presence or absence of significant depression and/or stress, maximize coping skills, provide positive support system. Participant is able to verbalize types and ability to use techniques and skills needed for reducing stress and depression.   Initial Review & Psychosocial Screening: Initial Psych Review & Screening - 11/09/19 1011      Initial Review   Current issues with  Current Stress Concerns    Comments  current kidney stone, president of church council, [place]      [place]   [place]     Barriers    Psychosocial barriers to participate in program  There are no identifiable barriers or psychosocial needs.;The patient should benefit from training in stress management and relaxation.      Screening Interventions   Interventions  Encouraged to exercise;To provide support and resources with identified psychosocial needs  Expected Outcomes  Short Term goal: Utilizing psychosocial counselor, staff and physician to assist with identification of specific Stressors or current issues interfering with healing process. Setting desired goal for each stressor or current issue identified.;Long Term Goal: Stressors or current issues are controlled or eliminated.;Short Term goal: Identification and review with participant of any Quality of Life or Depression concerns found by scoring the questionnaire.;Long Term goal: The participant improves quality of Life and PHQ9 Scores as seen by post scores and/or verbalization of changes       Quality of Life Scores:  Quality of Life - 11/12/19 1041      Quality of Life   Select  Quality of Life      Quality of Life Scores   Health/Function Pre  23 %    Socioeconomic Pre  24.67 %    Psych/Spiritual Pre  22.86 %    Family Pre  27.6 %    GLOBAL Pre  23.97 %      Scores of 19 and below usually indicate a poorer quality of life in these areas.  A difference of  2-3 points is a clinically meaningful difference.  A difference of 2-3 points in the total score of the Quality of Life Index has been associated with significant improvement in overall quality of life, self-image, physical symptoms, and general health in studies assessing change in quality of life.  PHQ-9: Recent Review Flowsheet Data    Depression screen [facility] 2/9 11/12/2019 10/19/2019 07/31/2019 07/19/2018 07/15/2017   Decreased Interest 1 0 0 0 0   Down, Depressed, Hopeless 0 0 0 0 0   PHQ - 2 Score 1 0 0 0 0   Altered sleeping 0 - - - -   Tired, decreased energy 1 - - - -   Change in appetite 0 - -  - -   Feeling bad or failure about yourself  0 - - - -   Trouble concentrating 0 - - - -   Moving slowly or fidgety/restless 0 - - - -   Suicidal thoughts 0 - - - -   PHQ-9 Score 2 - - - -   Difficult doing work/chores Not difficult at all - - - -     Interpretation of Total Score  Total Score Depression Severity:  1-4 = Minimal depression, 5-9 = Mild depression, 10-14 = Moderate depression, 15-19 = Moderately severe depression, 20-27 = Severe depression   Psychosocial Evaluation and Intervention: Psychosocial Evaluation - 11/09/19 1017      Psychosocial Evaluation & Interventions   Comments  Maxson is a very active person. He is wanting to get back to his [facility] [employer]. His wife is very supportive. He reports sleeping well, although he is currently dealing with a kidney stone that he has not passed for a few weeks. Due to his blood thinner post MI, he can't have surgery to remove it. 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    Expected Outcomes  Short: attend [facility] for exercise and education. Long: maintain positive self care habits    Continue Psychosocial Services   Follow up required by staff       Psychosocial Re-Evaluation:   Psychosocial Discharge (Final Psychosocial Re-Evaluation):   Vocational Rehabilitation: Provide vocational rehab assistance to qualifying candidates.   Vocational Rehab Evaluation & Intervention: Vocational Rehab - 11/09/19 1011  Initial Vocational Rehab Evaluation & Intervention   Assessment shows need for Vocational Rehabilitation  No       Education: Education Goals: Education classes will be provided on a variety of topics geared toward better understanding of heart health and risk factor modification. Participant will state understanding/return demonstration of topics presented as noted by education test  scores.  Learning Barriers/Preferences: Learning Barriers/Preferences - 11/09/19 1011      Learning Barriers/Preferences   Learning Barriers  None    Learning Preferences  None       Education Topics:  AED/CPR: - Group verbal and written instruction with the use of models to demonstrate the basic use of the AED with the basic ABC's of resuscitation.   General Nutrition Guidelines/Fats and Fiber: -Group instruction provided by verbal, written material, models and posters to present the general guidelines for heart healthy nutrition. Gives an explanation and review of dietary fats and fiber.   Controlling Sodium/Reading Food Labels: -Group verbal and written material supporting the discussion of sodium use in heart healthy nutrition. Review and explanation with models, verbal and written materials for utilization of the food label.   Exercise Physiology & General Exercise Guidelines: - Group verbal and written instruction with models to review the exercise physiology of the cardiovascular system and associated critical values. Provides general exercise guidelines with specific guidelines to those with heart or lung disease.    Aerobic Exercise & Resistance Training: - Gives group verbal and written instruction on the various components of exercise. Focuses on aerobic and resistive training programs and the benefits of this training and how to safely progress through these programs..   Flexibility, Balance, Mind/Body Relaxation: Provides group verbal/written instruction on the benefits of flexibility and balance training, including mind/body exercise modes such as yoga, pilates and tai chi.  Demonstration and skill practice provided.   Stress and Anxiety: - Provides group verbal and written instruction about the health risks of elevated stress and causes of high stress.  Discuss the correlation between heart/lung disease and anxiety and treatment options. Review healthy ways to  manage with stress and anxiety.   Depression: - Provides group verbal and written instruction on the correlation between heart/lung disease and depressed mood, treatment options, and the stigmas associated with seeking treatment.   Anatomy & Physiology of the Heart: - Group verbal and written instruction and models provide basic cardiac anatomy and physiology, with the coronary electrical and arterial systems. Review of Valvular disease and Heart Failure   Cardiac Procedures: - Group verbal and written instruction to review commonly prescribed medications for heart disease. Reviews the medication, class of the drug, and side effects. Includes the steps to properly store meds and maintain the prescription regimen. (beta blockers and nitrates)   Cardiac Medications I: - Group verbal and written instruction to review commonly prescribed medications for heart disease. Reviews the medication, class of the drug, and side effects. Includes the steps to properly store meds and maintain the prescription regimen.   Cardiac Medications II: -Group verbal and written instruction to review commonly prescribed medications for heart disease. Reviews the medication, class of the drug, and side effects. (all other drug classes)    Go Sex-Intimacy & Heart Disease, Get SMART - Goal Setting: - Group verbal and written instruction through game format to discuss heart disease and the return to sexual intimacy. Provides group verbal and written material to discuss and apply goal setting through the application of the S.M.A.R.T. Method.   Other Matters of the  Heart: - Provides group verbal, written materials and models to describe Stable Angina and Peripheral Artery. Includes description of the disease process and treatment options available to the cardiac patient.   Exercise & Equipment Safety: - Individual verbal instruction and demonstration of equipment use and safety with use of the equipment.    Cardiac Rehab from 11/12/2019 in [facility] Cardiac and Pulmonary Rehab  Date  11/12/19  Educator  [facility]  Instruction Review Code  1- Verbalizes Understanding      Infection Prevention: - Provides verbal and written material to individual with discussion of infection control including proper hand washing and proper equipment cleaning during exercise session.   Cardiac Rehab from 11/12/2019 in [facility] Cardiac and Pulmonary Rehab  Date  11/12/19  Educator  [facility]  Instruction Review Code  1- Verbalizes Understanding      Falls Prevention: - Provides verbal and written material to individual with discussion of falls prevention and safety.   Cardiac Rehab from 11/12/2019 in [facility] Cardiac and Pulmonary Rehab  Date  11/12/19  Educator  [facility]  Instruction Review Code  1- Verbalizes Understanding      Diabetes: - Individual verbal and written instruction to review signs/symptoms of diabetes, desired ranges of glucose level fasting, after meals and with exercise. Acknowledge that pre and post exercise glucose checks will be done for 3 sessions at entry of program.   Know Your Numbers and Risk Factors: -Group verbal and written instruction about important numbers in your health.  Discussion of what are risk factors and how they play a role in the disease process.  Review of Cholesterol, Blood Pressure, Diabetes, and BMI and the role they play in your overall health.   Sleep Hygiene: -Provides group verbal and written instruction about how sleep can affect your health.  Define sleep hygiene, discuss sleep cycles and impact of sleep habits. Review good sleep hygiene tips.    Other: -Provides group and verbal instruction on various topics (see comments)   Knowledge Questionnaire Score: Knowledge Questionnaire Score - 11/12/19 1041      Knowledge Questionnaire Score   Pre Score  25/26 only missed pulse question       Core Components/Risk Factors/Patient Goals at Admission: Personal Goals and Risk  Factors at Admission - 11/12/19 1042      Core Components/Risk Factors/Patient Goals on Admission    Weight Management  Yes;Weight Loss    Intervention  Weight Management: Develop a combined nutrition and exercise program designed to reach desired caloric intake, while maintaining appropriate intake of nutrient and fiber, sodium and fats, and appropriate energy expenditure required for the weight goal.;Weight Management: Provide education and appropriate resources to help participant work on and attain dietary goals.    Admit Weight  211 lb 9.6 oz (96 kg)    Goal Weight: Short Term  206 lb (93.4 kg)    Goal Weight: Long Term  200 lb (90.7 kg)    Expected Outcomes  Long Term: Adherence to nutrition and physical activity/exercise program aimed toward attainment of established weight goal;Short Term: Continue to assess and modify interventions until short term weight is achieved;Weight Loss: Understanding of general recommendations for a balanced deficit meal plan, which promotes 1-2 lb weight loss per week and includes a negative energy balance of [phone] kcal/d;Understanding of distribution of calorie intake throughout the day with the consumption of 4-5 meals/snacks;Understanding recommendations for meals to include 15-35% energy as protein, 25-35% energy from fat, 35-60% energy from carbohydrates, less than 2101m of dietary cholesterol,  20-35 gm of total fiber daily    Hypertension  Yes    Intervention  Provide education on lifestyle modifcations including regular physical activity/exercise, weight management, moderate sodium restriction and increased consumption of fresh fruit, vegetables, and low fat dairy, alcohol moderation, and smoking cessation.;Monitor prescription use compliance.    Expected Outcomes  Long Term: Maintenance of blood pressure at goal levels.;Short Term: Continued assessment and intervention until BP is < 140/51m HG in hypertensive participants. < 130/886mHG in hypertensive  participants with diabetes, heart failure or chronic kidney disease.    Lipids  Yes    Intervention  Provide education and support for participant on nutrition & aerobic/resistive exercise along with prescribed medications to achieve LDL <7056mHDL >29m33m  Expected Outcomes  Short Term: Participant states understanding of desired cholesterol values and is compliant with medications prescribed. Participant is following exercise prescription and nutrition guidelines.;Long Term: Cholesterol controlled with medications as prescribed, with individualized exercise RX and with personalized nutrition plan. Value goals: LDL < 70mg80mL > 40 mg.       Core Components/Risk Factors/Patient Goals Review:    Core Components/Risk Factors/Patient Goals at Discharge (Final Review):    ITP Comments: ITP Comments    Row Name 11/09/19 1008 11/12/19 1029 11/14/19 0859 11/21/19 0622     ITP Comments  Initial phone orientation completed. Diagnosis can be found in [facility]. EP orientation scheduled for 3/15 at 8am  Completed 6MWT and gym orientation.  Initial ITP created and sent for review to Dr. Mark Emily Filbertical Director.  First full day of exercise!  Patient was oriented to gym and equipment including functions, settings, policies, and procedures.  Patient's individual exercise prescription and treatment plan were reviewed.  All starting workloads were established based on the results of the 6 minute walk test done at initial orientation visit.  The plan for exercise progression was also introduced and progression will be customized based on patient's performance and goals.  30 day chart review completed. ITP sent to Dr M MilZachery Dakinscal Director, for review,changes as needed and signature. Continue with ITP if no changes requested       Comments:

## 2019-11-21 NOTE — Progress Notes (Signed)
Daily Session Note  Patient Details  Name: Philip Mack MRN: [id number] Date of Birth: Aug 14, 1951 Referring Provider:     Cardiac Rehab from 11/12/2019 in [facility] Cardiac and Pulmonary Rehab  Referring Provider  [name] [facility] Cardiologist Dr. [name]      Encounter Date: 11/21/2019  Check In: Session Check In - 11/21/19 0909      Check-In   Supervising physician immediately available to respond to emergencies  See telemetry face sheet for immediately available ER MD    Location  [facility]-Cardiac & Pulmonary Rehab    Staff Present  [name], RN, BSN, CCRP;Amanda Sommer, BA, ACSM CEP, Exercise Physiologist;Joseph Hood RCP,RRT,BSRT    Virtual Visit  No    Medication changes reported      No    Fall or balance concerns reported     No    Warm-up and Cool-down  Performed on first and last piece of equipment    Resistance Training Performed  Yes    VAD Patient?  No    PAD/SET Patient?  No      Pain Assessment   Currently in Pain?  No/denies          Social History   Tobacco Use  Smoking Status Former Smoker  . Types: Cigarettes  . Quit date: 08/30/1980  . Years since quitting: 39.2  Smokeless Tobacco Never Used    Goals Met:  Independence with exercise equipment Exercise tolerated well No report of cardiac concerns or symptoms  Goals Unmet:  Not Applicable  Comments: Pt able to follow exercise prescription today without complaint.  Will continue to monitor for progression.    [name] is Medical Director for [place] and [facility].

## 2019-11-23 ENCOUNTER — Encounter: Payer: Medicare Other | Admitting: *Deleted

## 2019-11-23 ENCOUNTER — Other Ambulatory Visit: Payer: Self-pay

## 2019-11-23 DIAGNOSIS — I214 Non-ST elevation (NSTEMI) myocardial infarction: Secondary | ICD-10-CM

## 2019-11-23 DIAGNOSIS — Z955 Presence of coronary angioplasty implant and graft: Secondary | ICD-10-CM

## 2019-11-23 NOTE — Progress Notes (Signed)
Daily Session Note  Patient Details  Name: Philip Mack MRN: [id number] Date of Birth: 10-05-1950 Referring Provider:     Cardiac Rehab from 11/12/2019 in [facility] Cardiac and Pulmonary Rehab  Referring Provider  [name] [facility] Cardiologist Dr. [name]      Encounter Date: 11/23/2019  Check In: Session Check In - 11/23/19 0927      Check-In   Supervising physician immediately available to respond to emergencies  See telemetry face sheet for immediately available ER MD    Location  [facility]-Cardiac & Pulmonary Rehab    Staff Present  Justin Mend RCP,RRT,BSRT;[name], RN, BSN, CCRP;Jessica Stonewall, MA, RCEP, CCRP, CCET    Virtual Visit  No    Medication changes reported      No    Fall or balance concerns reported     No    Warm-up and Cool-down  Performed on first and last piece of equipment    Resistance Training Performed  Yes    VAD Patient?  No    PAD/SET Patient?  No      Pain Assessment   Currently in Pain?  No/denies          Social History   Tobacco Use  Smoking Status Former Smoker  . Types: Cigarettes  . Quit date: 08/30/1980  . Years since quitting: 39.2  Smokeless Tobacco Never Used    Goals Met:  Independence with exercise equipment Exercise tolerated well No report of cardiac concerns or symptoms Strength training completed today  Goals Unmet:  Not Applicable  Comments: Pt able to follow exercise prescription today without complaint.  Will continue to monitor for progression.    [name] is Medical Director for [place] and [facility].

## 2019-11-26 ENCOUNTER — Encounter: Payer: Medicare Other | Admitting: *Deleted

## 2019-11-26 ENCOUNTER — Other Ambulatory Visit: Payer: Self-pay

## 2019-11-26 DIAGNOSIS — I214 Non-ST elevation (NSTEMI) myocardial infarction: Secondary | ICD-10-CM

## 2019-11-26 DIAGNOSIS — Z955 Presence of coronary angioplasty implant and graft: Secondary | ICD-10-CM | POA: Diagnosis not present

## 2019-11-26 NOTE — Progress Notes (Signed)
Daily Session Note  Patient Details  Name: Philip Mack MRN: [id number] Date of Birth: 07-29-1951 Referring Provider:     Cardiac Rehab from 11/12/2019 in [facility] Cardiac and Pulmonary Rehab  Referring Provider  [name] [facility] Cardiologist Dr. [name]      Encounter Date: 11/26/2019  Check In: Session Check In - 11/26/19 0805      Check-In   Supervising physician immediately available to respond to emergencies  See telemetry face sheet for immediately available ER MD    Location  [facility]-Cardiac & Pulmonary Rehab    Staff Present  [name], RN, BSN, CCRP;Joseph Hood RCP,RRT,BSRT;Kelly [place], [place], ACSM CEP, Exercise Physiologist    Virtual Visit  No    Medication changes reported      No    Fall or balance concerns reported     No    Warm-up and Cool-down  Performed on first and last piece of equipment    Resistance Training Performed  Yes    VAD Patient?  No    PAD/SET Patient?  No      Pain Assessment   Currently in Pain?  No/denies          Social History   Tobacco Use  Smoking Status Former Smoker  . Types: Cigarettes  . Quit date: 08/30/1980  . Years since quitting: 39.2  Smokeless Tobacco Never Used    Goals Met:  Independence with exercise equipment Exercise tolerated well No report of cardiac concerns or symptoms  Goals Unmet:    Comments: Pt able to follow exercise prescription today without complaint.  Will continue to monitor for progression.    [name] is Medical Director for [place] and [facility].

## 2019-11-28 ENCOUNTER — Encounter: Payer: Medicare Other | Admitting: *Deleted

## 2019-11-28 ENCOUNTER — Other Ambulatory Visit: Payer: Self-pay

## 2019-11-28 DIAGNOSIS — I214 Non-ST elevation (NSTEMI) myocardial infarction: Secondary | ICD-10-CM | POA: Diagnosis not present

## 2019-11-28 DIAGNOSIS — Z955 Presence of coronary angioplasty implant and graft: Secondary | ICD-10-CM

## 2019-11-28 NOTE — Progress Notes (Signed)
Daily Session Note  Patient Details  Name: Philip Mack MRN: [id number] Date of Birth: Mar 03, 1951 Referring Provider:     Cardiac Rehab from 11/12/2019 in [facility] Cardiac and Pulmonary Rehab  Referring Provider  [name] [facility] Cardiologist Dr. [name]      Encounter Date: 11/28/2019  Check In: Session Check In - 11/28/19 0824      Check-In   Supervising physician immediately available to respond to emergencies  See telemetry face sheet for immediately available ER MD    Location  [facility]-Cardiac & Pulmonary Rehab    Staff Present  [name], RN, BSN, CCRP;Melissa Caiola RDN, LDN;Joseph Hood RCP,RRT,BSRT    Virtual Visit  No    Medication changes reported      No    Fall or balance concerns reported     No    Warm-up and Cool-down  Performed on first and last piece of equipment    Resistance Training Performed  Yes    VAD Patient?  No    PAD/SET Patient?  No      Pain Assessment   Currently in Pain?  No/denies          Social History   Tobacco Use  Smoking Status Former Smoker  . Types: Cigarettes  . Quit date: 08/30/1980  . Years since quitting: 39.2  Smokeless Tobacco Never Used    Goals Met:  Independence with exercise equipment Exercise tolerated well No report of cardiac concerns or symptoms  Goals Unmet:  Not Applicable  Comments: Pt able to follow exercise prescription today without complaint.  Will continue to monitor for progression.    [name] is Medical Director for [place] and [facility].

## 2019-11-30 ENCOUNTER — Encounter: Payer: Medicare Other | Attending: Internal Medicine | Admitting: *Deleted

## 2019-11-30 ENCOUNTER — Other Ambulatory Visit: Payer: Self-pay | Admitting: Internal Medicine

## 2019-11-30 ENCOUNTER — Other Ambulatory Visit: Payer: Self-pay

## 2019-11-30 DIAGNOSIS — I214 Non-ST elevation (NSTEMI) myocardial infarction: Secondary | ICD-10-CM

## 2019-11-30 DIAGNOSIS — Z955 Presence of coronary angioplasty implant and graft: Secondary | ICD-10-CM | POA: Insufficient documentation

## 2019-11-30 NOTE — Progress Notes (Signed)
Daily Session Note  Patient Details  Name: Philip Mack MRN: [id number] Date of Birth: Oct 31, 1950 Referring Provider:     Cardiac Rehab from 11/12/2019 in [facility] Cardiac and Pulmonary Rehab  Referring Provider  [name] [facility] Cardiologist Dr. [name]      Encounter Date: 11/30/2019  Check In: Session Check In - 11/30/19 0909      Check-In   Supervising physician immediately available to respond to emergencies  See telemetry face sheet for immediately available ER MD    Location  [facility]-Cardiac & Pulmonary Rehab    Staff Present  [name], RN, BSN, CCRP;Meredith Sherryll Burger, RN BSN;Joseph Hood RCP,RRT,BSRT    Virtual Visit  No    Medication changes reported      No    Fall or balance concerns reported     No    Warm-up and Cool-down  Performed on first and last piece of equipment    Resistance Training Performed  Yes    VAD Patient?  No    PAD/SET Patient?  No      Pain Assessment   Currently in Pain?  No/denies          Social History   Tobacco Use  Smoking Status Former Smoker  . Types: Cigarettes  . Quit date: 08/30/1980  . Years since quitting: 39.2  Smokeless Tobacco Never Used    Goals Met:  Independence with exercise equipment Exercise tolerated well No report of cardiac concerns or symptoms  Goals Unmet:  Not Applicable  Comments: Pt able to follow exercise prescription today without complaint.  Will continue to monitor for progression.    [name] is Medical Director for [place] and [facility].

## 2019-12-03 ENCOUNTER — Other Ambulatory Visit: Payer: Self-pay | Admitting: *Deleted

## 2019-12-03 ENCOUNTER — Encounter: Payer: Medicare Other | Admitting: *Deleted

## 2019-12-03 ENCOUNTER — Other Ambulatory Visit: Payer: Self-pay

## 2019-12-03 ENCOUNTER — Telehealth: Payer: Self-pay | Admitting: Urology

## 2019-12-03 DIAGNOSIS — Z955 Presence of coronary angioplasty implant and graft: Secondary | ICD-10-CM

## 2019-12-03 DIAGNOSIS — I214 Non-ST elevation (NSTEMI) myocardial infarction: Secondary | ICD-10-CM

## 2019-12-03 MED ORDER — TAMSULOSIN HCL 0.4 MG PO CAPS: 0.4000 mg | ORAL_CAPSULE | Freq: Every day | ORAL | 1 refills | Status: DC

## 2019-12-03 NOTE — Progress Notes (Signed)
Daily Session Note  Patient Details  Name: Harald Quevedo MRN: [id number] Date of Birth: [birth year] Referring Provider:     Cardiac Rehab from 11/12/2019 in [facility] Cardiac and Pulmonary Rehab  Referring Provider  [name] [facility] Cardiologist Dr. [name]      Encounter Date: 12/03/2019  Check In: Session Check In - 12/03/19 0807      Check-In   Supervising physician immediately available to respond to emergencies  See telemetry face sheet for immediately available ER MD    Location  [facility]-Cardiac & Pulmonary Rehab    Staff Present  [name], RN, BSN, CCRP;Joseph Hood RCP,RRT,BSRT;Kelly [place], [place], ACSM CEP, Exercise Physiologist    Virtual Visit  No    Medication changes reported      No    Fall or balance concerns reported     No    Warm-up and Cool-down  Performed on first and last piece of equipment    Resistance Training Performed  Yes    VAD Patient?  No    PAD/SET Patient?  No          Social History   Tobacco Use  Smoking Status Former Smoker  . Types: Cigarettes  . Quit date: 08/30/1980  . Years since quitting: 39.2  Smokeless Tobacco Never Used    Goals Met:  Independence with exercise equipment Exercise tolerated well No report of cardiac concerns or symptoms  Goals Unmet:  Not Applicable  Comments: Pt able to follow exercise prescription today without complaint.  Will continue to monitor for progression.    [name] is Medical Director for [place] [facility] and [facility].

## 2019-12-03 NOTE — Telephone Encounter (Signed)
PT stopped by office and needs a new RX for Tamsulosin to treat kidney stones.  The RX that he has was prescribed by pcp and pt only has 3 left.  He uses [facility] on [address]  He is an established pt of [employer]

## 2019-12-05 ENCOUNTER — Other Ambulatory Visit: Payer: Self-pay

## 2019-12-05 ENCOUNTER — Encounter: Payer: Medicare Other | Admitting: *Deleted

## 2019-12-05 DIAGNOSIS — I214 Non-ST elevation (NSTEMI) myocardial infarction: Secondary | ICD-10-CM | POA: Diagnosis not present

## 2019-12-05 DIAGNOSIS — Z955 Presence of coronary angioplasty implant and graft: Secondary | ICD-10-CM

## 2019-12-05 NOTE — Progress Notes (Signed)
Daily Session Note  Patient Details  Name: Philip Mack MRN: [id number] Date of Birth: 05-29-1951 Referring Provider:     Cardiac Rehab from 11/12/2019 in [facility] Cardiac and Pulmonary Rehab  Referring Provider  [name] [facility] Cardiologist Dr. [name]      Encounter Date: 12/05/2019  Check In: Session Check In - 12/05/19 0914      Check-In   Staff Present  Nyoka Cowden, RN, BSN, MA;Susanne Bice, RN, BSN, CCRP;Joseph Hood RCP,RRT,BSRT    Virtual Visit  No    Medication changes reported      No    Fall or balance concerns reported     No    Warm-up and Cool-down  Performed on first and last piece of equipment    Resistance Training Performed  Yes    VAD Patient?  No    PAD/SET Patient?  No      Pain Assessment   Currently in Pain?  No/denies          Social History   Tobacco Use  Smoking Status Former Smoker  . Types: Cigarettes  . Quit date: 08/30/1980  . Years since quitting: 39.2  Smokeless Tobacco Never Used    Goals Met:  Independence with exercise equipment Exercise tolerated well No report of cardiac concerns or symptoms  Goals Unmet:  Not Applicable  Comments: Pt able to follow exercise prescription today without complaint.  Will continue to monitor for progression.   [name] is Medical Director for [place] and [facility].

## 2019-12-06 DIAGNOSIS — I6523 Occlusion and stenosis of bilateral carotid arteries: Secondary | ICD-10-CM | POA: Diagnosis not present

## 2019-12-06 DIAGNOSIS — I25118 Atherosclerotic heart disease of native coronary artery with other forms of angina pectoris: Secondary | ICD-10-CM | POA: Diagnosis not present

## 2019-12-06 DIAGNOSIS — I10 Essential (primary) hypertension: Secondary | ICD-10-CM | POA: Diagnosis not present

## 2019-12-06 DIAGNOSIS — I2510 Atherosclerotic heart disease of native coronary artery without angina pectoris: Secondary | ICD-10-CM | POA: Diagnosis not present

## 2019-12-06 DIAGNOSIS — E782 Mixed hyperlipidemia: Secondary | ICD-10-CM | POA: Diagnosis not present

## 2019-12-07 ENCOUNTER — Encounter: Payer: Medicare Other | Admitting: *Deleted

## 2019-12-07 ENCOUNTER — Other Ambulatory Visit: Payer: Self-pay

## 2019-12-07 DIAGNOSIS — Z955 Presence of coronary angioplasty implant and graft: Secondary | ICD-10-CM

## 2019-12-07 DIAGNOSIS — I214 Non-ST elevation (NSTEMI) myocardial infarction: Secondary | ICD-10-CM

## 2019-12-07 NOTE — Progress Notes (Signed)
Daily Session Note  Patient Details  Name: Philip Mack MRN: [id number] Date of Birth: [birth year] Referring Provider:     Cardiac Rehab from 11/12/2019 in [facility] Cardiac and Pulmonary Rehab  Referring Provider  [name] [facility] Cardiologist Dr. [name]      Encounter Date: 12/07/2019  Check In: Session Check In - 12/07/19 0824      Check-In   Supervising physician immediately available to respond to emergencies  See telemetry face sheet for immediately available ER MD    Location  [facility]-Cardiac & Pulmonary Rehab    Staff Present  [name], RN, BSN, Lance Sell, BA, ACSM CEP, Exercise Physiologist;Joseph Tessie Fass RCP,RRT,BSRT    Virtual Visit  No    Medication changes reported      No    Fall or balance concerns reported     No    Warm-up and Cool-down  Performed on first and last piece of equipment    Resistance Training Performed  Yes    VAD Patient?  No    PAD/SET Patient?  No      Pain Assessment   Currently in Pain?  No/denies          Social History   Tobacco Use  Smoking Status Former Smoker  . Types: Cigarettes  . Quit date: 08/30/1980  . Years since quitting: 39.2  Smokeless Tobacco Never Used    Goals Met:  Independence with exercise equipment Exercise tolerated well No report of cardiac concerns or symptoms  Goals Unmet:  Not Applicable  Comments: Pt able to follow exercise prescription today without complaint.  Will continue to monitor for progression.    [name] is Medical Director for [place] and [facility].

## 2019-12-10 ENCOUNTER — Encounter: Payer: Medicare Other | Admitting: *Deleted

## 2019-12-10 ENCOUNTER — Other Ambulatory Visit: Payer: Self-pay

## 2019-12-10 DIAGNOSIS — I214 Non-ST elevation (NSTEMI) myocardial infarction: Secondary | ICD-10-CM | POA: Diagnosis not present

## 2019-12-10 DIAGNOSIS — Z955 Presence of coronary angioplasty implant and graft: Secondary | ICD-10-CM | POA: Diagnosis not present

## 2019-12-10 NOTE — Progress Notes (Signed)
Daily Session Note  Patient Details  Name: Philip Mack MRN: [id number] Date of Birth: 10-24-1950 Referring Provider:     Cardiac Rehab from 11/12/2019 in [facility] Cardiac and Pulmonary Rehab  Referring Provider  [name] [facility] Cardiologist Dr. [name]      Encounter Date: 12/10/2019  Check In: Session Check In - 12/10/19 0943      Check-In   Supervising physician immediately available to respond to emergencies  See telemetry face sheet for immediately available ER MD    Location  [facility]-Cardiac & Pulmonary Rehab    Staff Present  [name], RN, BSN, Laveda Norman, BS, ACSM CEP, Exercise Physiologist;Joseph Tessie Fass RCP,RRT,BSRT    Virtual Visit  No    Medication changes reported      No    Fall or balance concerns reported     No    Warm-up and Cool-down  Performed on first and last piece of equipment    Resistance Training Performed  Yes    VAD Patient?  No      Pain Assessment   Currently in Pain?  No/denies          Social History   Tobacco Use  Smoking Status Former Smoker  . Types: Cigarettes  . Quit date: 08/30/1980  . Years since quitting: 39.3  Smokeless Tobacco Never Used    Goals Met:  Independence with exercise equipment Exercise tolerated well Personal goals reviewed No report of cardiac concerns or symptoms  Goals Unmet:  Not Applicable  Comments: Pt able to follow exercise prescription today without complaint.  Will continue to monitor for progression.    [name] is Medical Director for [place] and [facility].

## 2019-12-10 NOTE — Progress Notes (Signed)
Reviewed home exercise with pt today.  Pt plans to continue to go to [employer] (elliptical, weights, jump rope T/Th) and fitness room in neighborhood for exercise.  He has started to try to run again on the weekend mornings.  Reviewed THR, pulse, RPE, sign and symptoms, NTG use, and when to call 911 or MD.  Also discussed weather considerations and indoor options.  Pt voiced understanding.

## 2019-12-11 ENCOUNTER — Other Ambulatory Visit: Payer: Self-pay | Admitting: Radiology

## 2019-12-11 DIAGNOSIS — N201 Calculus of ureter: Secondary | ICD-10-CM

## 2019-12-12 ENCOUNTER — Ambulatory Visit
Admission: RE | Admit: 2019-12-12 | Discharge: 2019-12-12 | Disposition: A | Discharge status: Home / Self Care | Payer: Medicare Other | Source: Ambulatory Visit | Attending: Urology | Admitting: Urology

## 2019-12-12 ENCOUNTER — Encounter: Payer: Medicare Other | Admitting: *Deleted

## 2019-12-12 ENCOUNTER — Other Ambulatory Visit: Payer: Self-pay

## 2019-12-12 DIAGNOSIS — I214 Non-ST elevation (NSTEMI) myocardial infarction: Secondary | ICD-10-CM

## 2019-12-12 DIAGNOSIS — N201 Calculus of ureter: Secondary | ICD-10-CM

## 2019-12-12 DIAGNOSIS — Z955 Presence of coronary angioplasty implant and graft: Secondary | ICD-10-CM | POA: Diagnosis not present

## 2019-12-12 NOTE — Progress Notes (Signed)
Daily Session Note  Patient Details  Name: Philip Mack MRN: [id number] Date of Birth: Jan 13, 1951 Referring Provider:     Cardiac Rehab from 11/12/2019 in [facility] Cardiac and Pulmonary Rehab  Referring Provider  [name] [facility] Cardiologist Dr. [name]      Encounter Date: 12/12/2019  Check In: Session Check In - 12/12/19 0848      Check-In   Supervising physician immediately available to respond to emergencies  See telemetry face sheet for immediately available ER MD    Location  [facility]-Cardiac & Pulmonary Rehab    Staff Present  [name], RN, BSN, Lance Sell, BA, ACSM CEP, Exercise Physiologist;Joseph Tessie Fass RCP,RRT,BSRT    Virtual Visit  No    Medication changes reported      No    Fall or balance concerns reported     No    Warm-up and Cool-down  Performed on first and last piece of equipment    Resistance Training Performed  Yes    VAD Patient?  No    PAD/SET Patient?  No      Pain Assessment   Currently in Pain?  No/denies          Social History   Tobacco Use  Smoking Status Former Smoker  . Types: Cigarettes  . Quit date: 08/30/1980  . Years since quitting: 39.3  Smokeless Tobacco Never Used    Goals Met:  Independence with exercise equipment Exercise tolerated well No report of cardiac concerns or symptoms  Goals Unmet:  Not Applicable  Comments: Pt able to follow exercise prescription today without complaint.  Will continue to monitor for progression.    [name] is Medical Director for [place] and [facility].

## 2019-12-13 ENCOUNTER — Other Ambulatory Visit: Payer: Medicare Other

## 2019-12-13 ENCOUNTER — Other Ambulatory Visit: Payer: Self-pay

## 2019-12-13 DIAGNOSIS — N201 Calculus of ureter: Secondary | ICD-10-CM

## 2019-12-13 LAB — MICROSCOPIC EXAMINATION
Bacteria, UA: NONE SEEN
RBC, Urine: NONE SEEN /hpf (ref 0–2)

## 2019-12-13 LAB — URINALYSIS, COMPLETE
Bilirubin, UA: NEGATIVE
Glucose, UA: NEGATIVE
Ketones, UA: NEGATIVE
Leukocytes,UA: NEGATIVE
Nitrite, UA: NEGATIVE
Protein,UA: NEGATIVE
RBC, UA: NEGATIVE
Specific Gravity, UA: 1.025 (ref 1.005–1.030)
Urobilinogen, Ur: 0.2 mg/dL (ref 0.2–1.0)
pH, UA: 5.5 (ref 5.0–7.5)

## 2019-12-14 ENCOUNTER — Other Ambulatory Visit: Payer: Self-pay | Admitting: Radiology

## 2019-12-14 DIAGNOSIS — I214 Non-ST elevation (NSTEMI) myocardial infarction: Secondary | ICD-10-CM

## 2019-12-14 DIAGNOSIS — Z955 Presence of coronary angioplasty implant and graft: Secondary | ICD-10-CM | POA: Diagnosis not present

## 2019-12-14 DIAGNOSIS — N201 Calculus of ureter: Secondary | ICD-10-CM

## 2019-12-14 NOTE — Progress Notes (Signed)
Daily Session Note  Patient Details  Name: Philip Mack MRN: [id number] Date of Birth: 03-Mar-1951 Referring Provider:     Cardiac Rehab from 11/12/2019 in [facility] Cardiac and Pulmonary Rehab  Referring Provider  [name] [facility] Cardiologist Dr. [name]      Encounter Date: 12/14/2019  Check In: Session Check In - 12/14/19 0819      Check-In   Supervising physician immediately available to respond to emergencies  See telemetry face sheet for immediately available ER MD    Location  [facility]-Cardiac & Pulmonary Rehab    Staff Present  Vida Rigger RN, BSN;Jessica Luan Pulling, MA, RCEP, CCRP, CCET;Joseph Hood RCP,RRT,BSRT    Virtual Visit  No    Medication changes reported      No    Fall or balance concerns reported     No    Warm-up and Cool-down  Performed on first and last piece of equipment    Resistance Training Performed  Yes    VAD Patient?  No    PAD/SET Patient?  No      Pain Assessment   Currently in Pain?  No/denies          Social History   Tobacco Use  Smoking Status Former Smoker  . Types: Cigarettes  . Quit date: 08/30/1980  . Years since quitting: 39.3  Smokeless Tobacco Never Used    Goals Met:  Independence with exercise equipment Exercise tolerated well No report of cardiac concerns or symptoms Strength training completed today  Goals Unmet:  Not Applicable  Comments: Pt able to follow exercise prescription today without complaint.  Will continue to monitor for progression.   [name] is Medical Director for [place] and [facility].

## 2019-12-17 ENCOUNTER — Other Ambulatory Visit: Payer: Self-pay

## 2019-12-17 ENCOUNTER — Encounter: Payer: Medicare Other | Admitting: *Deleted

## 2019-12-17 DIAGNOSIS — I214 Non-ST elevation (NSTEMI) myocardial infarction: Secondary | ICD-10-CM | POA: Diagnosis not present

## 2019-12-17 DIAGNOSIS — Z955 Presence of coronary angioplasty implant and graft: Secondary | ICD-10-CM

## 2019-12-17 NOTE — Progress Notes (Signed)
Daily Session Note  Patient Details  Name: Philip Mack MRN: [id number] Date of Birth: 12/15/1950 Referring Provider:     Cardiac Rehab from 11/12/2019 in [facility] Cardiac and Pulmonary Rehab  Referring Provider  Callwood, Dwayne MD [Primary Cardiologist Dr. Bruce Kowalski]      Encounter Date: 12/17/2019  Check In: Session Check In - 12/17/19 0824      Check-In   Supervising physician immediately available to respond to emergencies  See telemetry face sheet for immediately available ER MD    Location  [facility]-Cardiac & Pulmonary Rehab    Staff Present  Susanne Bice, RN, BSN, CCRP;Kelly Hayes, BS, ACSM CEP, Exercise Physiologist;Joseph Hood RCP,RRT,BSRT    Virtual Visit  No    Medication changes reported      No    Fall or balance concerns reported     No    Warm-up and Cool-down  Performed on first and last piece of equipment    Resistance Training Performed  Yes    VAD Patient?  No    PAD/SET Patient?  No      Pain Assessment   Currently in Pain?  No/denies          Social History   Tobacco Use  Smoking Status Former Smoker  . Types: Cigarettes  . Quit date: 08/30/1980  . Years since quitting: 39.3  Smokeless Tobacco Never Used    Goals Met:  Independence with exercise equipment Exercise tolerated well No report of cardiac concerns or symptoms  Goals Unmet:  Not Applicable  Comments: Pt able to follow exercise prescription today without complaint.  Will continue to monitor for progression.    Dr. Mark Miller is Medical Director for [facility] and [facility]. 

## 2019-12-18 ENCOUNTER — Other Ambulatory Visit: Payer: Self-pay

## 2019-12-18 ENCOUNTER — Other Ambulatory Visit
Admission: RE | Admit: 2019-12-18 | Discharge: 2019-12-18 | Disposition: A | Discharge status: Home / Self Care | Payer: Medicare Other | Source: Ambulatory Visit | Attending: Urology | Admitting: Urology

## 2019-12-18 ENCOUNTER — Telehealth: Payer: Self-pay | Admitting: Emergency Medicine

## 2019-12-18 DIAGNOSIS — I214 Non-ST elevation (NSTEMI) myocardial infarction: Secondary | ICD-10-CM

## 2019-12-18 DIAGNOSIS — Z01812 Encounter for preprocedural laboratory examination: Secondary | ICD-10-CM | POA: Insufficient documentation

## 2019-12-18 DIAGNOSIS — Z20822 Contact with and (suspected) exposure to covid-19: Secondary | ICD-10-CM | POA: Diagnosis not present

## 2019-12-18 LAB — CULTURE, URINE COMPREHENSIVE

## 2019-12-18 LAB — SARS CORONAVIRUS 2 (TAT 6-24 HRS): SARS Coronavirus 2: NEGATIVE

## 2019-12-18 NOTE — Progress Notes (Signed)
Completed Initial RD Eval 

## 2019-12-18 NOTE — Telephone Encounter (Signed)
Called patient to inform of addendum note to ct scan from February visit--recommend repeat scan in 2-4 months from now.  Explained to patient and sent result to Dr. Connye Burkitt via epic.

## 2019-12-19 ENCOUNTER — Other Ambulatory Visit: Payer: Self-pay | Admitting: Internal Medicine

## 2019-12-19 ENCOUNTER — Telehealth: Payer: Self-pay

## 2019-12-19 ENCOUNTER — Encounter: Payer: Medicare Other | Admitting: *Deleted

## 2019-12-19 ENCOUNTER — Encounter: Payer: Self-pay | Admitting: *Deleted

## 2019-12-19 DIAGNOSIS — I214 Non-ST elevation (NSTEMI) myocardial infarction: Secondary | ICD-10-CM | POA: Diagnosis not present

## 2019-12-19 DIAGNOSIS — Z955 Presence of coronary angioplasty implant and graft: Secondary | ICD-10-CM

## 2019-12-19 DIAGNOSIS — N22 Calculus of urinary tract in diseases classified elsewhere: Secondary | ICD-10-CM

## 2019-12-19 DIAGNOSIS — M899 Disorder of bone, unspecified: Secondary | ICD-10-CM

## 2019-12-19 NOTE — Telephone Encounter (Signed)
Pt would like to clarify whether you want him to do his CT Abd/Pelvis now or end of June or anytime between end of Apr/Jun?  Would it be better to wait until end of June?

## 2019-12-19 NOTE — Progress Notes (Signed)
Cardiac Individual Treatment Plan  Patient Details  Name: Philip Mack MRN: [id number] Date of Birth: May 06, 1951 Referring Provider:     Cardiac Rehab from 11/12/2019 in [facility] Cardiac and Pulmonary Rehab  Referring Provider  [name] [facility] Cardiologist Dr. [name]      Initial Encounter Date:    Cardiac Rehab from 11/12/2019 in [facility] Cardiac and Pulmonary Rehab  Date  11/12/19      Visit Diagnosis: NSTEMI (non-ST elevated myocardial infarction) [facility])  Status post coronary artery stent placement  Patient's Home Medications on Admission:  Current Outpatient Medications:  .  aspirin 81 MG chewable tablet, Chew 1 tablet (81 mg total) by mouth daily., Disp:  , Rfl:  .  atorvastatin (LIPITOR) 80 MG tablet, Take 1 tablet by mouth daily., Disp: , Rfl:  .  metoprolol tartrate (LOPRESSOR) 25 MG tablet, Take 0.5 tablets (12.5 mg total) by mouth 2 (two) times daily., Disp: 30 tablet, Rfl: 0 .  ondansetron (ZOFRAN ODT) 4 MG disintegrating tablet, Take 1 tablet (4 mg total) by mouth every 8 (eight) hours as needed for nausea or vomiting. (Patient not taking: Reported on 11/14/2019), Disp: 30 tablet, Rfl: 0 .  oxyCODONE-acetaminophen (PERCOCET/ROXICET) 5-325 MG tablet, Take 1 tablet by mouth every 4 (four) hours as needed for severe pain., Disp: 30 tablet, Rfl: 0 .  tamsulosin (FLOMAX) 0.4 MG CAPS capsule, Take 1 capsule (0.4 mg total) by mouth daily., Disp: 30 capsule, Rfl: 1  Past Medical History: Past Medical History:  Diagnosis Date  . Cataract   . Hepatitis   . Hyperlipidemia     Tobacco Use: Social History   Tobacco Use  Smoking Status Former Smoker  . Types: Cigarettes  . Quit date: 08/30/1980  . Years since quitting: 39.3  Smokeless Tobacco Never Used    Labs: Recent Chemical engineer    Labs for ITP Cardiac and Pulmonary Rehab Latest Ref Rng & Units 07/14/2015 07/14/2016 07/19/2018 07/31/2019 10/12/2019   Cholestrol 0 - 200 mg/dL 232(H) 213(H)  218(H) 233(H) 231(H)   LDLCALC 0 - 99 mg/dL 151(H) 130(H) 144(H) 157(H) 156(H)   LDLDIRECT mg/dL - - - - -   HDL >40 mg/dL 61.30 67.90 61.60 62.50 67   Trlycerides <150 mg/dL 98.0 76.0 64.0 70.0 41       Exercise Target Goals: Exercise Program Goal: Individual exercise prescription set using results from initial 6 min walk test and THRR while considering  patient's activity barriers and safety.   Exercise Prescription Goal: Initial exercise prescription builds to 30-45 minutes a day of aerobic activity, 2-3 days per week.  Home exercise guidelines will be given to patient during program as part of exercise prescription that the participant will acknowledge.   Education: Aerobic Exercise & Resistance Training: - Gives group verbal and written instruction on the various components of exercise. Focuses on aerobic and resistive training programs and the benefits of this training and how to safely progress through these programs..   Education: Exercise & Equipment Safety: - Individual verbal instruction and demonstration of equipment use and safety with use of the equipment.   Cardiac Rehab from 11/12/2019 in [facility] Cardiac and Pulmonary Rehab  Date  11/12/19  Educator  [facility]  Instruction Review Code  1- Verbalizes Understanding      Education: Exercise Physiology & General Exercise Guidelines: - Group verbal and written instruction with models to review the exercise physiology of the cardiovascular system and associated critical values. Provides general exercise guidelines with specific guidelines to  those with heart or lung disease.    Education: Flexibility, Balance, Mind/Body Relaxation: Provides group verbal/written instruction on the benefits of flexibility and balance training, including mind/body exercise modes such as yoga, pilates and tai chi.  Demonstration and skill practice provided.   Activity Barriers & Risk Stratification: Activity Barriers & Cardiac Risk Stratification -  11/12/19 1035      Activity Barriers & Cardiac Risk Stratification   Activity Barriers  Other (comment);Joint Problems;Balance Concerns;Deconditioning;Muscular Weakness    Comments  current kidney stone pain; shoulder pain    Cardiac Risk Stratification  Moderate       6 Minute Walk: 6 Minute Walk    Row Name 11/12/19 1032         6 Minute Walk   Phase  Initial     Distance  1540 feet     Walk Time  6 minutes     # of Rest Breaks  0     MPH  2.92     METS  3.65     RPE  11     VO2 Peak  12.77     Symptoms  No     Resting HR  67 bpm     Resting BP  122/64     Resting Oxygen Saturation   98 %     Exercise Oxygen Saturation  during 6 min walk  95 %     Max Ex. HR  84 bpm     Max Ex. BP  146/74     2 Minute Post BP  104/66        Oxygen Initial Assessment:   Oxygen Re-Evaluation:   Oxygen Discharge (Final Oxygen Re-Evaluation):   Initial Exercise Prescription: Initial Exercise Prescription - 11/12/19 1000      Date of Initial Exercise RX and Referring Provider   Date  11/12/19    Referring Provider  [name]   Primary Cardiologist Dr. [name]     Treadmill   MPH  2.9    Grade  1    Minutes  15    METs  3.6      Elliptical   Level  2    Speed  3.5    Minutes  15    METs  3      REL-XR   Level  3    Speed  50    Minutes  15    METs  3      Prescription Details   Frequency (times per week)  3    Duration  Progress to 30 minutes of continuous aerobic without signs/symptoms of physical distress      Intensity   THRR 40-80% of Max Heartrate  101-135    Ratings of Perceived Exertion  11-13    Perceived Dyspnea  0-4      Progression   Progression  Continue to progress workloads to maintain intensity without signs/symptoms of physical distress.      Resistance Training   Training Prescription  Yes    Weight  3 lb    Reps  10-15       Perform Capillary Blood Glucose checks as needed.  Exercise Prescription  Changes: Exercise Prescription Changes    Row Name 11/12/19 1000 11/27/19 0700 12/13/19 1200         Response to Exercise   Blood Pressure (Admit)  122/64  138/77  104/58     Blood Pressure (Exercise)  146/74  160/72  144/58  Blood Pressure (Exit)  104/44  120/84  96/54     Heart Rate (Admit)  67 bpm  78 bpm  68 bpm     Heart Rate (Exercise)  84 bpm  119 bpm  113 bpm     Heart Rate (Exit)  63 bpm  79 bpm  68 bpm     Oxygen Saturation (Admit)  98 %  --  --     Oxygen Saturation (Exercise)  95 %  --  --     Rating of Perceived Exertion (Exercise)  '11  13  14     ' Symptoms  none  none  none     Comments  walk test results  --  --     Duration  --  Continue with 30 min of aerobic exercise without signs/symptoms of physical distress.  Continue with 30 min of aerobic exercise without signs/symptoms of physical distress.     Intensity  --  THRR unchanged  THRR unchanged       Progression   Progression  --  Continue to progress workloads to maintain intensity without signs/symptoms of physical distress.  Continue to progress workloads to maintain intensity without signs/symptoms of physical distress.     Average METs  --  4.37  4.65       Resistance Training   Training Prescription  --  Yes  Yes     Weight  --  3 lb  5 lb     Reps  --  10-15  10-15       Interval Training   Interval Training  --  No  No       Treadmill   MPH  --  3.5  3.5     Grade  --  2  2     Minutes  --  15  15     METs  --  4.64  4.65       Elliptical   Level  --  3  5     Speed  --  3  3     Minutes  --  15  15     METs  --  4.1  --        Exercise Comments: Exercise Comments    Row Name 11/14/19 0900           Exercise Comments  First full day of exercise!  Patient was oriented to gym and equipment including functions, settings, policies, and procedures.  Patient's individual exercise prescription and treatment plan were reviewed.  All starting workloads were established based on the results of  the 6 minute walk test done at initial orientation visit.  The plan for exercise progression was also introduced and progression will be customized based on patient's performance and goals.          Exercise Goals and Review: Exercise Goals    Row Name 11/12/19 1038             Exercise Goals   Increase Physical Activity  Yes       Intervention  Provide advice, education, support and counseling about physical activity/exercise needs.;Develop an individualized exercise prescription for aerobic and resistive training based on initial evaluation findings, risk stratification, comorbidities and participant's personal goals.       Expected Outcomes  Short Term: Attend rehab on a regular basis to increase amount of physical activity.;Long Term: Add in home exercise to make exercise part of routine and to increase amount  of physical activity.;Long Term: Exercising regularly at least 3-5 days a week.       Increase Strength and Stamina  Yes       Intervention  Provide advice, education, support and counseling about physical activity/exercise needs.;Develop an individualized exercise prescription for aerobic and resistive training based on initial evaluation findings, risk stratification, comorbidities and participant's personal goals.       Expected Outcomes  Short Term: Increase workloads from initial exercise prescription for resistance, speed, and METs.;Short Term: Perform resistance training exercises routinely during rehab and add in resistance training at home;Long Term: Improve cardiorespiratory fitness, muscular endurance and strength as measured by increased METs and functional capacity (6MWT)       Able to understand and use rate of perceived exertion (RPE) scale  Yes       Intervention  Provide education and explanation on how to use RPE scale       Expected Outcomes  Short Term: Able to use RPE daily in rehab to express subjective intensity level;Long Term:  Able to use RPE to guide  intensity level when exercising independently       Able to understand and use Dyspnea scale  Yes       Intervention  Provide education and explanation on how to use Dyspnea scale       Expected Outcomes  Short Term: Able to use Dyspnea scale daily in rehab to express subjective sense of shortness of breath during exertion;Long Term: Able to use Dyspnea scale to guide intensity level when exercising independently       Knowledge and understanding of Target Heart Rate Range (THRR)  Yes       Intervention  Provide education and explanation of THRR including how the numbers were predicted and where they are located for reference       Expected Outcomes  Short Term: Able to state/look up THRR;Short Term: Able to use daily as guideline for intensity in rehab;Long Term: Able to use THRR to govern intensity when exercising independently       Able to check pulse independently  Yes       Intervention  Provide education and demonstration on how to check pulse in carotid and radial arteries.;Review the importance of being able to check your own pulse for safety during independent exercise       Expected Outcomes  Short Term: Able to explain why pulse checking is important during independent exercise;Long Term: Able to check pulse independently and accurately       Understanding of Exercise Prescription  Yes       Intervention  Provide education, explanation, and written materials on patient's individual exercise prescription       Expected Outcomes  Short Term: Able to explain program exercise prescription;Long Term: Able to explain home exercise prescription to exercise independently          Exercise Goals Re-Evaluation : Exercise Goals Re-Evaluation    Row Name 11/14/19 0900 11/27/19 0723 12/10/19 0820         Exercise Goal Re-Evaluation   Exercise Goals Review  Able to understand and use rate of perceived exertion (RPE) scale;Knowledge and understanding of Target Heart Rate Range  (THRR);Understanding of Exercise Prescription  Increase Physical Activity;Increase Strength and Stamina;Understanding of Exercise Prescription  Increase Physical Activity;Able to understand and use rate of perceived exertion (RPE) scale;Increase Strength and Stamina;Able to understand and use Dyspnea scale;Knowledge and understanding of Target Heart Rate Range (THRR);Able to check pulse independently;Understanding of Exercise  Prescription     Comments  Reviewed RPE scale, THR and program prescription with pt today.  Pt voiced understanding and was given a copy of goals to take home.  Freddie is doing well in rehab.  He has really taken to the elliptical and having fun with exercise.  He is already on level 3 with it.  We will continue to monitor his progress.  Reviewed home exercise with pt today.  Pt plans to continue to go to [employer] (elliptical, weights, jump rope T/Th) and fitness room in neighborhood for exercise.  He has started to try to run again on the weekend mornings.  Reviewed THR, pulse, RPE, sign and symptoms, NTG use, and when to call 911 or MD.  Also discussed weather considerations and indoor options.  Pt voiced understanding.  He is dong well overall.     Expected Outcomes  Short: Use RPE daily to regulate intensity. Long: Follow program prescription in THR.  Short: Continue to improve on ellipitcal.  Long: Continue to improve stamina.  Short: Continue to exericse daily  Long: Continue to improve stamina.        Discharge Exercise Prescription (Final Exercise Prescription Changes): Exercise Prescription Changes - 12/13/19 1200      Response to Exercise   Blood Pressure (Admit)  104/58    Blood Pressure (Exercise)  144/58    Blood Pressure (Exit)  96/54    Heart Rate (Admit)  68 bpm    Heart Rate (Exercise)  113 bpm    Heart Rate (Exit)  68 bpm    Rating of Perceived Exertion (Exercise)  14    Symptoms  none    Duration  Continue with 30 min of aerobic exercise without signs/symptoms of  physical distress.    Intensity  THRR unchanged      Progression   Progression  Continue to progress workloads to maintain intensity without signs/symptoms of physical distress.    Average METs  4.65      Resistance Training   Training Prescription  Yes    Weight  5 lb    Reps  10-15      Interval Training   Interval Training  No      Treadmill   MPH  3.5    Grade  2    Minutes  15    METs  4.65      Elliptical   Level  5    Speed  3    Minutes  15       Nutrition:  Target Goals: Understanding of nutrition guidelines, daily intake of sodium <155m, cholesterol <2034m calories 30% from fat and 7% or less from saturated fats, daily to have 5 or more servings of fruits and vegetables.  Education: Controlling Sodium/Reading Food Labels -Group verbal and written material supporting the discussion of sodium use in heart healthy nutrition. Review and explanation with models, verbal and written materials for utilization of the food label.   Education: General Nutrition Guidelines/Fats and Fiber: -Group instruction provided by verbal, written material, models and posters to present the general guidelines for heart healthy nutrition. Gives an explanation and review of dietary fats and fiber.   Biometrics: Pre Biometrics - 11/12/19 1038      Pre Biometrics   Height  6' 4.4" (1.941 m)    Weight  211 lb 9.6 oz (96 kg)    BMI (Calculated)  25.48    Single Leg Stand  4.5 seconds  Nutrition Therapy Plan and Nutrition Goals: Nutrition Therapy & Goals - 12/18/19 1001      Nutrition Therapy   Diet  Low Na, HH    Drug/Food Interactions  Statins/Certain Fruits    Protein (specify units)  80-85g    Fiber  30 grams    Whole Grain Foods  3 servings    Saturated Fats  12 max. grams    Fruits and Vegetables  5 servings/day    Sodium  1.5 grams      Personal Nutrition Goals   Nutrition Goal  ST: continue with current changes, read labels to make sure no trans fat/ low  Na, include variety LT: get back to normal activity (usually)    Comments  B: couple servings of fruit (apples, peaches, pears, berries, mangto, banana), oatmeal and cereal (looking for fiber or protein), muffins (baked homemade, instead of oil will use appple sauce). L: 4-5x/week his wife will make something: not cooking with much salt or fat S: sweet or salty. D: light fare; protein bar with some fruit, deli meat with some cheese, usually fruit included. ice cream on 'sunday afternoon. Discussed HH eating.      Intervention Plan   Intervention  Prescribe, educate and counsel regarding individualized specific dietary modifications aiming towards targeted core components such as weight, hypertension, lipid management, diabetes, heart failure and other comorbidities.;Nutrition handout(s) given to patient.    Expected Outcomes  Short Term Goal: Understand basic principles of dietary content, such as calories, fat, sodium, cholesterol and nutrients.;Short Term Goal: A plan has been developed with personal nutrition goals set during dietitian appointment.;Long Term Goal: Adherence to prescribed nutrition plan.       Nutrition Assessments: Nutrition Assessments - 11/12/19 1041      MEDFICTS Scores   Pre Score  42       MEDIFICTS Score Key:          ?70 Need to make dietary changes          40' -70 Heart Healthy Diet         ? 40 Therapeutic Level Cholesterol Diet  Nutrition Goals Re-Evaluation:   Nutrition Goals Discharge (Final Nutrition Goals Re-Evaluation):   Psychosocial: Target Goals: Acknowledge presence or absence of significant depression and/or stress, maximize coping skills, provide positive support system. Participant is able to verbalize types and ability to use techniques and skills needed for reducing stress and depression.   Education: Depression - Provides group verbal and written instruction on the correlation between heart/lung disease and depressed mood, treatment options,  and the stigmas associated with seeking treatment.   Education: Sleep Hygiene -Provides group verbal and written instruction about how sleep can affect your health.  Define sleep hygiene, discuss sleep cycles and impact of sleep habits. Review good sleep hygiene tips.     Education: Stress and Anxiety: - Provides group verbal and written instruction about the health risks of elevated stress and causes of high stress.  Discuss the correlation between heart/lung disease and anxiety and treatment options. Review healthy ways to manage with stress and anxiety.    Initial Review & Psychosocial Screening: Initial Psych Review & Screening - 11/09/19 1011      Initial Review   Current issues with  Current Stress Concerns    Comments  current kidney stone, president of church council, sons out state      [place]?  Yes   wife     Barriers   Psychosocial barriers  to participate in program  There are no identifiable barriers or psychosocial needs.;The patient should benefit from training in stress management and relaxation.      Screening Interventions   Interventions  Encouraged to exercise;To provide support and resources with identified psychosocial needs    Expected Outcomes  Short Term goal: Utilizing psychosocial counselor, staff and physician to assist with identification of specific Stressors or current issues interfering with healing process. Setting desired goal for each stressor or current issue identified.;Long Term Goal: Stressors or current issues are controlled or eliminated.;Short Term goal: Identification and review with participant of any Quality of Life or Depression concerns found by scoring the questionnaire.;Long Term goal: The participant improves quality of Life and PHQ9 Scores as seen by post scores and/or verbalization of changes       Quality of Life Scores:  Quality of Life - 11/12/19 1041      Quality of Life   Select  Quality of  Life      Quality of Life Scores   Health/Function Pre  23 %    Socioeconomic Pre  24.67 %    Psych/Spiritual Pre  22.86 %    Family Pre  27.6 %    GLOBAL Pre  23.97 %      Scores of 19 and below usually indicate a poorer quality of life in these areas.  A difference of  2-3 points is a clinically meaningful difference.  A difference of 2-3 points in the total score of the Quality of Life Index has been associated with significant improvement in overall quality of life, self-image, physical symptoms, and general health in studies assessing change in quality of life.  PHQ-9: Recent Review Flowsheet Data    Depression screen [facility] 2/9 11/12/2019 10/19/2019 07/31/2019 07/19/2018 07/15/2017   Decreased Interest 1 0 0 0 0   Down, Depressed, Hopeless 0 0 0 0 0   PHQ - 2 Score 1 0 0 0 0   Altered sleeping 0 - - - -   Tired, decreased energy 1 - - - -   Change in appetite 0 - - - -   Feeling bad or failure about yourself  0 - - - -   Trouble concentrating 0 - - - -   Moving slowly or fidgety/restless 0 - - - -   Suicidal thoughts 0 - - - -   PHQ-9 Score 2 - - - -   Difficult doing work/chores Not difficult at all - - - -     Interpretation of Total Score  Total Score Depression Severity:  1-4 = Minimal depression, 5-9 = Mild depression, 10-14 = Moderate depression, 15-19 = Moderately severe depression, 20-27 = Severe depression   Psychosocial Evaluation and Intervention: Psychosocial Evaluation - 11/09/19 1017      Psychosocial Evaluation & Interventions   Comments  Arjen is a very active person. He is wanting to get back [facility] [employer]. His wife is very supportive. He reports sleeping well, although he is currently dealing with a kidney stone that he has not passed for a few weeks. Due to his blood thinner post MI, he can't have surgery to remove it. He reports besides the kidney stone, he states another source of stress is his part of his church's council during all the  covid related issues.    Expected Outcomes  Short: attend [facility] for exercise and education. Long: maintain positive self care habits    Continue Psychosocial Services  Follow up required by staff       Psychosocial Re-Evaluation: Psychosocial Re-Evaluation    Row Name 12/10/19 4098             Psychosocial Re-Evaluation   Current issues with  Current Stress Concerns       Comments  Amr is doing well mentally.  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 His wife also had an emergency appendectomy 2 weeks ago.  All of this has limited their ability to travel and they hope to get back to it in May.  Overall, he is good.  He sleeps very well.       Expected Outcomes  Short: Make it through May.  Long: Continue to get in exercise for stress relief.       Interventions  Encouraged to attend Cardiac Rehabilitation for the exercise;Stress management education          Psychosocial Discharge (Final Psychosocial Re-Evaluation): Psychosocial Re-Evaluation - 12/10/19 1191      Psychosocial Re-Evaluation   Current issues with  Current Stress Concerns    Comments  Deval is doing well mentally.  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 His wife also had an emergency appendectomy 2 weeks ago.  All of this has limited their ability to travel and they hope to get back to it in May.  Overall, he is good.  He sleeps very well.    Expected Outcomes  Short: Make it through May.  Long: Continue to get in exercise for stress relief.    Interventions  Encouraged to attend Cardiac Rehabilitation for the exercise;Stress management education       Vocational Rehabilitation: Provide vocational rehab assistance to qualifying candidates.   Vocational Rehab Evaluation & Intervention: Vocational Rehab - 11/09/19 1011      Initial Vocational Rehab Evaluation & Intervention   Assessment  shows need for Vocational Rehabilitation  No       Education: Education Goals: Education classes will be provided on a variety of topics geared toward better understanding of heart health and risk factor modification. Participant will state understanding/return demonstration of topics presented as noted by education test scores.  Learning Barriers/Preferences: Learning Barriers/Preferences - 11/09/19 1011      Learning Barriers/Preferences   Learning Barriers  None    Learning Preferences  None       General Cardiac Education Topics:  AED/CPR: - Group verbal and written instruction with the use of models to demonstrate the basic use of the AED with the basic ABC's of resuscitation.   Anatomy & Physiology of the Heart: - Group verbal and written instruction and models provide basic cardiac anatomy and physiology, with the coronary electrical and arterial systems. Review of Valvular disease and Heart Failure   Cardiac Procedures: - Group verbal and written instruction to review commonly prescribed medications for heart disease. Reviews the medication, class of the drug, and side effects. Includes the steps to properly store meds and maintain the prescription regimen. (beta blockers and nitrates)   Cardiac Medications I: - Group verbal and written instruction to review commonly prescribed medications for heart disease. Reviews the medication, class of the drug, and side effects. Includes the steps to properly store meds and maintain the prescription regimen.   Cardiac Medications II: -Group verbal and written instruction to review commonly prescribed medications for heart disease.  Reviews the medication, class of the drug, and side effects. (all other drug classes)    Go Sex-Intimacy & Heart Disease, Get SMART - Goal Setting: - Group verbal and written instruction through game format to discuss heart disease and the return to sexual intimacy. Provides group verbal and written  material to discuss and apply goal setting through the application of the S.M.A.R.T. Method.   Other Matters of the Heart: - Provides group verbal, written materials and models to describe Stable Angina and Peripheral Artery. Includes description of the disease process and treatment options available to the cardiac patient.   Infection Prevention: - Provides verbal and written material to individual with discussion of infection control including proper hand washing and proper equipment cleaning during exercise session.   Cardiac Rehab from 11/12/2019 in [facility] Cardiac and Pulmonary Rehab  Date  11/12/19  Educator  [facility]  Instruction Review Code  1- Verbalizes Understanding      Falls Prevention: - Provides verbal and written material to individual with discussion of falls prevention and safety.   Cardiac Rehab from 11/12/2019 in [facility] Cardiac and Pulmonary Rehab  Date  11/12/19  Educator  [facility]  Instruction Review Code  1- Verbalizes Understanding      Other: -Provides group and verbal instruction on various topics (see comments)   Knowledge Questionnaire Score: Knowledge Questionnaire Score - 11/12/19 1041      Knowledge Questionnaire Score   Pre Score  25/26 only missed pulse question       Core Components/Risk Factors/Patient Goals at Admission: Personal Goals and Risk Factors at Admission - 11/12/19 1042      Core Components/Risk Factors/Patient Goals on Admission    Weight Management  Yes;Weight Loss    Intervention  Weight Management: Develop a combined nutrition and exercise program designed to reach desired caloric intake, while maintaining appropriate intake of nutrient and fiber, sodium and fats, and appropriate energy expenditure required for the weight goal.;Weight Management: Provide education and appropriate resources to help participant work on and attain dietary goals.    Admit Weight  211 lb 9.6 oz (96 kg)    Goal Weight: Short Term  206 lb (93.4 kg)    Goal Weight:  Long Term  200 lb (90.7 kg)    Expected Outcomes  Long Term: Adherence to nutrition and physical activity/exercise program aimed toward attainment of established weight goal;Short Term: Continue to assess and modify interventions until short term weight is achieved;Weight Loss: Understanding of general recommendations for a balanced deficit meal plan, which promotes 1-2 lb weight loss per week and includes a negative energy balance of [phone] kcal/d;Understanding of distribution of calorie intake throughout the day with the consumption of 4-5 meals/snacks;Understanding recommendations for meals to include 15-35% energy as protein, 25-35% energy from fat, 35-60% energy from carbohydrates, less than 264m of dietary cholesterol, 20-35 gm of total fiber daily    Hypertension  Yes    Intervention  Provide education on lifestyle modifcations including regular physical activity/exercise, weight management, moderate sodium restriction and increased consumption of fresh fruit, vegetables, and low fat dairy, alcohol moderation, and smoking cessation.;Monitor prescription use compliance.    Expected Outcomes  Long Term: Maintenance of blood pressure at goal levels.;Short Term: Continued assessment and intervention until BP is < 140/960mHG in hypertensive participants. < 130/8031mG in hypertensive participants with diabetes, heart failure or chronic kidney disease.    Lipids  Yes    Intervention  Provide education and support for participant on nutrition & aerobic/resistive  exercise along with prescribed medications to achieve LDL <70m, HDL >413m    Expected Outcomes  Short Term: Participant states understanding of desired cholesterol values and is compliant with medications prescribed. Participant is following exercise prescription and nutrition guidelines.;Long Term: Cholesterol controlled with medications as prescribed, with individualized exercise RX and with personalized nutrition plan. Value goals: LDL <  7038mHDL > 40 mg.       Education:Diabetes - Individual verbal and written instruction to review signs/symptoms of diabetes, desired ranges of glucose level fasting, after meals and with exercise. Acknowledge that pre and post exercise glucose checks will be done for 3 sessions at entry of program.   Education: Know Your Numbers and Risk Factors: -Group verbal and written instruction about important numbers in your health.  Discussion of what are risk factors and how they play a role in the disease process.  Review of Cholesterol, Blood Pressure, Diabetes, and BMI and the role they play in your overall health.   Core Components/Risk Factors/Patient Goals Review:  Goals and Risk Factor Review    Row Name 12/10/19 0824             Core Components/Risk Factors/Patient Goals Review   Personal Goals Review  Weight Management/Obesity;Hypertension;Lipids       Review  KevJosephine doing well overall.  His weight has been pretty steady and he eats well.  It was noted to have gone up over the weekend some but he is not sure why.  His blood pressures have been good and recently it has been trying to be better about hydrating. He doesn't check at home regularly but since it has been low in class we talked about checking it a little more frequently.       Expected Outcomes  Short: Work on weight coming back down and check pressures more frequently Long: Continue to monitor risk factors.          Core Components/Risk Factors/Patient Goals at Discharge (Final Review):  Goals and Risk Factor Review - 12/10/19 0824      Core Components/Risk Factors/Patient Goals Review   Personal Goals Review  Weight Management/Obesity;Hypertension;Lipids    Review  KevPharrell doing well overall.  His weight has been pretty steady and he eats well.  It was noted to have gone up over the weekend some but he is not sure why.  His blood pressures have been good and recently it has been trying to be better about hydrating. He  doesn't check at home regularly but since it has been low in class we talked about checking it a little more frequently.    Expected Outcomes  Short: Work on weight coming back down and check pressures more frequently Long: Continue to monitor risk factors.       ITP Comments: ITP Comments    Row Name 11/09/19 1008 11/12/19 1029 11/14/19 0859 11/21/19 0622 12/18/19 1035   ITP Comments  Initial phone orientation completed. [facility] EP orientation scheduled for 3/15 at 8am  Completed 6MWT and gym orientation.  Initial ITP created and sent for review to Dr. MarEmily Filbertedical Director.  First full day of exercise!  Patient was oriented to gym and equipment including functions, settings, policies, and procedures.  Patient's individual exercise prescription and treatment plan were reviewed.  All starting workloads were established based on the results of the 6 minute walk test done at initial orientation visit.  The plan for exercise progression was also introduced  and progression will be customized based on patient's performance and goals.  30 day chart review completed. ITP sent to Dr Zachery Dakins Medical Director, for review,changes as needed and signature. Continue with ITP if no changes requested  Completed Initial RD Eval   Row Name 12/19/19 0552           ITP Comments  30 Day review completed. Medical Director review done, changes made as directed,and approval shown by signature of Market researcher.          Comments:

## 2019-12-19 NOTE — Telephone Encounter (Signed)
2-3 months from now And if he needs another CT because of the kidney stone in the meantime, we can cancel this one

## 2019-12-19 NOTE — Telephone Encounter (Signed)
Pt agrees.  CT will be end of Jun/July unless needed sooner.

## 2019-12-19 NOTE — Progress Notes (Signed)
Daily Session Note  Patient Details  Name: Philip Mack MRN: [id number] Date of Birth: 01/06/1951 Referring Provider:     Cardiac Rehab from 11/12/2019 in [facility] Cardiac and Pulmonary Rehab  Referring Provider  [name] [facility] Cardiologist Dr. [name]      Encounter Date: 12/19/2019  Check In: Session Check In - 12/19/19 0820      Check-In   Supervising physician immediately available to respond to emergencies  See telemetry face sheet for immediately available ER MD    Location  [facility]-Cardiac & Pulmonary Rehab    Staff Present  [name], RN, BSN, CCRP;[name], MA, RCEP, CCRP, CCET;[name], [place], ACSM CEP, Exercise Physiologist    Virtual Visit  No    Medication changes reported      No    Fall or balance concerns reported     No    Warm-up and Cool-down  Performed on first and last piece of equipment    Resistance Training Performed  Yes    VAD Patient?  No    PAD/SET Patient?  No      Pain Assessment   Currently in Pain?  No/denies          Social History   Tobacco Use  Smoking Status Former Smoker  . Types: Cigarettes  . Quit date: 08/30/1980  . Years since quitting: 39.3  Smokeless Tobacco Never Used    Goals Met:  Independence with exercise equipment Exercise tolerated well No report of cardiac concerns or symptoms  Goals Unmet:  Not Applicable  Comments: Pt able to follow exercise prescription today without complaint.  Will continue to monitor for progression.    [name] is Medical Director for [place] and [facility].

## 2019-12-20 ENCOUNTER — Ambulatory Visit: Payer: Medicare Other

## 2019-12-20 ENCOUNTER — Encounter: Payer: Self-pay | Admitting: Urology

## 2019-12-20 ENCOUNTER — Other Ambulatory Visit: Payer: Self-pay

## 2019-12-20 ENCOUNTER — Ambulatory Visit
Admission: RE | Admit: 2019-12-20 | Discharge: 2019-12-20 | Disposition: A | Discharge status: Home / Self Care | Payer: Medicare Other | Attending: Urology | Admitting: Urology

## 2019-12-20 ENCOUNTER — Encounter
Admission: RE | Disposition: A | Discharge status: Home / Self Care | Payer: Self-pay | Source: Home / Self Care | Attending: Urology

## 2019-12-20 DIAGNOSIS — Z79899 Other long term (current) drug therapy: Secondary | ICD-10-CM | POA: Insufficient documentation

## 2019-12-20 DIAGNOSIS — Z87891 Personal history of nicotine dependence: Secondary | ICD-10-CM | POA: Diagnosis not present

## 2019-12-20 DIAGNOSIS — Z7982 Long term (current) use of aspirin: Secondary | ICD-10-CM | POA: Insufficient documentation

## 2019-12-20 DIAGNOSIS — I252 Old myocardial infarction: Secondary | ICD-10-CM | POA: Diagnosis not present

## 2019-12-20 DIAGNOSIS — E785 Hyperlipidemia, unspecified: Secondary | ICD-10-CM | POA: Insufficient documentation

## 2019-12-20 DIAGNOSIS — Z8249 Family history of ischemic heart disease and other diseases of the circulatory system: Secondary | ICD-10-CM | POA: Diagnosis not present

## 2019-12-20 DIAGNOSIS — Z955 Presence of coronary angioplasty implant and graft: Secondary | ICD-10-CM | POA: Insufficient documentation

## 2019-12-20 DIAGNOSIS — N201 Calculus of ureter: Secondary | ICD-10-CM | POA: Insufficient documentation

## 2019-12-20 HISTORY — PX: EXTRACORPOREAL SHOCK WAVE LITHOTRIPSY: SHX1557

## 2019-12-20 SURGERY — LITHOTRIPSY, ESWL
Anesthesia: Moderate Sedation | Laterality: Left

## 2019-12-20 MED ORDER — DIPHENHYDRAMINE HCL 25 MG PO CAPS: 25.0000 mg | ORAL_CAPSULE | ORAL | Status: DC

## 2019-12-20 MED ORDER — DIAZEPAM 5 MG PO TABS: 10.0000 mg | ORAL_TABLET | ORAL | Status: AC

## 2019-12-20 MED ORDER — ONDANSETRON HCL 4 MG/2ML IJ SOLN: 4.0000 mg | Freq: Once | INTRAMUSCULAR | Status: AC | PRN

## 2019-12-20 MED ORDER — DIPHENHYDRAMINE HCL 25 MG PO CAPS: 25.0000 mg | ORAL_CAPSULE | ORAL | Status: AC

## 2019-12-20 MED ORDER — CIPROFLOXACIN HCL 500 MG PO TABS
ORAL_TABLET | ORAL | Status: AC
  Administered 2019-12-20: 500 mg via ORAL
  Filled 2019-12-20: qty 1

## 2019-12-20 MED ORDER — ONDANSETRON HCL 4 MG/2ML IJ SOLN
INTRAMUSCULAR | Status: AC
  Administered 2019-12-20: 10:00:00 4 mg via INTRAVENOUS
  Filled 2019-12-20: qty 2

## 2019-12-20 MED ORDER — SODIUM CHLORIDE 0.9 % IV SOLN: INTRAVENOUS | Status: DC

## 2019-12-20 MED ORDER — CIPROFLOXACIN HCL 500 MG PO TABS: 500.0000 mg | ORAL_TABLET | ORAL | Status: AC

## 2019-12-20 MED ORDER — CIPROFLOXACIN HCL 500 MG PO TABS: 500.0000 mg | ORAL_TABLET | ORAL | Status: DC

## 2019-12-20 MED ORDER — DIPHENHYDRAMINE HCL 25 MG PO CAPS
ORAL_CAPSULE | ORAL | Status: AC
  Administered 2019-12-20: 10:00:00 25 mg via ORAL
  Filled 2019-12-20: qty 1

## 2019-12-20 MED ORDER — DIAZEPAM 5 MG PO TABS: 10.0000 mg | ORAL_TABLET | ORAL | Status: DC

## 2019-12-20 MED ORDER — DIAZEPAM 5 MG PO TABS
ORAL_TABLET | ORAL | Status: AC
  Administered 2019-12-20: 10 mg via ORAL
  Filled 2019-12-20: qty 2

## 2019-12-20 NOTE — H&P (Signed)
H&P updated today 12/20/19 [name] Failed to pass stone spontaneously, elected ESWL per discussion with Dr. Leane Para Rod  Philip Mack  [id number]  Referring provider: Venia Carbon, MD  [address] [place], [place] [address]     Chief Complaint  Patient presents with  . Nephrolithiasis   HPI:  69 y.o. male previously seen for Peyronie's disease. He states approximately 1 week ago he had intermittent episodes of left flank pain. He presented to the ED early 10/23/2019 with a 2-3-day history of left flank pain which acutely worsened that day. The pain was in the left flank and was nonradiating. No precipitating, aggravating or alleviating factors. He had nausea without vomiting. Denied fever or chills.  A CT of the abdomen pelvis with contrast was performed which was remarkable for a left distal ureteral calculus with mild hydronephrosis/hydroureter. Punctate, bilateral renal calculi were also noted. He received parenteral analgesics in the ED with pain control and was discharged on tamsulosin, hydrocodone and Zofran. Denies prior history of stone disease.  Since his ED visit he has had minimal pain.  He was hospitalized on 10/12/2019 with chest pain and NSTEMI and underwent angioplasty with placement of a drug-eluting stent. Dual antiplatelet therapy with Brilinta and ASA recommended x12 months.  PMH:      Past Medical History:  Diagnosis Date  . Cataract   . Hepatitis   . Hyperlipidemia    Surgical History:       Past Surgical History:  Procedure Laterality Date  . Arm injury  1975  . COLONOSCOPY    . CORONARY STENT INTERVENTION N/A 10/15/2019   Procedure: CORONARY STENT INTERVENTION; Surgeon: Yolonda Kida, MD; Location: [facility]; Service: Cardiovascular; Laterality: N/A;  . HERNIA REPAIR  1959   Bilateral, inguinal  . LEFT HEART CATH AND CORONARY ANGIOGRAPHY N/A 10/15/2019   Procedure: LEFT HEART CATH AND CORONARY ANGIOGRAPHY;  Surgeon: Corey Skains, MD; Location: [facility]; Service: Cardiovascular; Laterality: N/A;  . POLYPECTOMY    . TONSILLECTOMY AND ADENOIDECTOMY  1961   Home Medications:  Allergies as of 10/24/2019   No Known Allergies          Medication List       Accurate as of October 24, 2019 11:59 PM. If you have any questions, ask your nurse or doctor.        aspirin 81 MG chewable tablet  Chew 1 tablet (81 mg total) by mouth daily.   atorvastatin 40 MG tablet  Commonly known as: LIPITOR  Take 1 tablet (40 mg total) by mouth daily at 6 PM.   metoprolol tartrate 25 MG tablet  Commonly known as: LOPRESSOR  Take 0.5 tablets (12.5 mg total) by mouth 2 (two) times daily.   ondansetron 4 MG disintegrating tablet  Commonly known as: Zofran ODT  Take 1 tablet (4 mg total) by mouth every 8 (eight) hours as needed for nausea or vomiting.   oxyCODONE-acetaminophen 5-325 MG tablet  Commonly known as: PERCOCET/ROXICET  Take 1 tablet by mouth every 4 (four) hours as needed for severe pain.   tamsulosin 0.4 MG Caps capsule  Commonly known as: Flomax  Take 1 capsule (0.4 mg total) by mouth daily.   ticagrelor 90 MG Tabs tablet  Commonly known as: BRILINTA  Take 1 tablet (90 mg total) by mouth 2 (two) times daily.       Allergies: No Known Allergies  Family History:  Family History  Problem Relation Age of Onset  . Cancer Mother    Lung, (surgically resected)  . Heart disease Mother   . Hearing loss Other    On Mom's side  . Hypertension Neg Hx   . Diabetes Neg Hx   . Colon cancer Neg Hx   . Colon polyps Neg Hx   . Stomach cancer Neg Hx   . Rectal cancer Neg Hx    Social History: reports that he quit smoking about 39 years ago. His smoking use included cigarettes. He has never used smokeless tobacco. He reports current alcohol use. He reports that he does not use drugs.  Physical Exam:  BP 104/63  Pulse 64  Ht 6' (1.829 m)  Wt 218 lb (98.9 kg)  BMI 29.57  kg/m  Constitutional: Alert and oriented, No acute distress.  HEENT: [place] AT, moist mucus membranes. Trachea midline, no masses.  Cardiovascular: No clubbing, cyanosis, or edema.  Respiratory: Normal respiratory effort, no increased work of breathing.  Skin: No rashes, bruises or suspicious lesions.  Neurologic: Grossly intact, no focal deficits, moving all 4 extremities.  Psychiatric: Normal mood and affect.  Pertinent Imaging:  CT and KUB personally reviewed      Results for orders placed during the hospital encounter of 10/24/19  Abdomen 1 view (KUB)   Narrative CLINICAL DATA: Kidney stone follow-up, LEFT-sided pain which has  resolved  EXAM:  ABDOMEN - 1 VIEW  COMPARISON: CT abdomen pelvis 10/23/2019  FINDINGS:  Excreted contrast identified within the urinary bladder and the  distal LEFT ureter.  Distal LEFT ureter appears dilated extending to a point just above  the ureterovesical junction where the calculus was seen on the scout  image of the prior CT, unchanged.  No additional urinary tract calcifications.  Bowel gas pattern normal.  No acute osseous findings.  Degenerative disc disease changes lumbar spine.  IMPRESSION:  Mildly dilated distal LEFT ureter terminating at the previously  identified distal LEFT ureteral calculus, unchanged since scout  image of prior CT.  Electronically Signed  By: Lavonia Dana M.D.  On: 10/24/2019 14:17   Assessment & Plan:  - Left distal ureteral calculus with renal colic  KUB performed today does show remaining contrast coming to the 7 mm calculus in the distal ureter. He presently is minimally symptomatic.  We discussed various treatment options for urolithiasis including observation with or without medical expulsive therapy, shockwave lithotripsy (SWL), ureteroscopy and laser lithotripsy with stent placement.  We discussed that management is based on stone size, location, density, patient co-morbidities, and patient preference.  Stones  <51mm in size have a >80% spontaneous passage rate. Data surrounding the use of tamsulosin for medical expulsive therapy is controversial, but meta analyses suggests it is most efficacious for distal stones between 5-9mm in size.  SWL has a lower stone free rate in a single procedure, but also a lower complication rate compared to ureteroscopy and avoids a stent and associated stent related symptoms. Possible complications include renal hematoma, steinstrasse, and need for additional treatment.  Ureteroscopy with laser lithotripsy and stent placement has a higher stone free rate than SWL in a single procedure, however increased complication rate including possible infection, ureteral injury, bleeding, and stent related morbidity. Common stent related symptoms include dysuria, urgency/frequency, and flank pain.  After an extensive discussion of the risks and benefits of the above treatment options, the patient would initially prefer a limited trial of passage with medical expulsion therapy.  If he does require  intervention will need cardiology clearance for anesthesia. Ureteroscopy could be performed on dual antiplatelet therapy. We will need to check the latest [place] guidelines for treatment of distal ureteral stones on antiplatelet therapy.  A KUB for early next week was ordered to assess for distal stone progression.  Abbie Sons, [place]  [address] [place]  [place], [place] [address]  [phone]

## 2019-12-20 NOTE — Discharge Instructions (Signed)
See [facility] discharge instructions in chart.  AMBULATORY SURGERY  DISCHARGE INSTRUCTIONS   1) The drugs that you were given will stay in your system until tomorrow so for the next 24 hours you should not:  A) Drive an automobile B) Make any legal decisions C) Drink any alcoholic beverage   2) You may resume regular meals tomorrow.  Today it is better to start with liquids and gradually work up to solid foods.  You may eat anything you prefer, but it is better to start with liquids, then soup and crackers, and gradually work up to solid foods.   3) Please notify your doctor immediately if you have any unusual bleeding, trouble breathing, redness and pain at the surgery site, drainage, fever, or pain not relieved by medication.    4) Additional Instructions:        Please contact your physician with any problems or Same Day Surgery at [phone], Monday through Friday 6 am to 4 pm, or [place] [facility] [place] Main number at [phone].  

## 2019-12-24 ENCOUNTER — Encounter: Payer: Medicare Other | Admitting: *Deleted

## 2019-12-24 ENCOUNTER — Other Ambulatory Visit: Payer: Self-pay

## 2019-12-24 DIAGNOSIS — I214 Non-ST elevation (NSTEMI) myocardial infarction: Secondary | ICD-10-CM

## 2019-12-24 DIAGNOSIS — Z955 Presence of coronary angioplasty implant and graft: Secondary | ICD-10-CM

## 2019-12-24 NOTE — Progress Notes (Signed)
Daily Session Note  Patient Details  Name: Philip Mack MRN: [id number] Date of Birth: 02-Dec-1950 Referring Provider:     Cardiac Rehab from 11/12/2019 in [facility] Cardiac and Pulmonary Rehab  Referring Provider  [name] [facility] Cardiologist Dr. [name]      Encounter Date: 12/24/2019  Check In: Session Check In - 12/24/19 0958      Check-In   Supervising physician immediately available to respond to emergencies  See telemetry face sheet for immediately available ER MD    Location  [facility]-Cardiac & Pulmonary Rehab    Staff Present  [name], RN, BSN, Laveda Norman, BS, ACSM CEP, Exercise Physiologist;Joseph Tessie Fass RCP,RRT,BSRT    Virtual Visit  No    Medication changes reported      No    Fall or balance concerns reported     No    Warm-up and Cool-down  Performed on first and last piece of equipment    Resistance Training Performed  Yes    VAD Patient?  No    PAD/SET Patient?  No      Pain Assessment   Currently in Pain?  No/denies          Social History   Tobacco Use  Smoking Status Former Smoker  . Types: Cigarettes  . Quit date: 08/30/1980  . Years since quitting: 39.3  Smokeless Tobacco Never Used    Goals Met:  Independence with exercise equipment Exercise tolerated well No report of cardiac concerns or symptoms  Goals Unmet:  Not Applicable  Comments: Pt able to follow exercise prescription today without complaint.  Will continue to monitor for progression.    [name] is Medical Director for [place] and [facility].

## 2019-12-26 ENCOUNTER — Other Ambulatory Visit: Payer: Self-pay

## 2019-12-26 ENCOUNTER — Encounter: Payer: Medicare Other | Admitting: *Deleted

## 2019-12-26 DIAGNOSIS — I214 Non-ST elevation (NSTEMI) myocardial infarction: Secondary | ICD-10-CM

## 2019-12-26 DIAGNOSIS — Z955 Presence of coronary angioplasty implant and graft: Secondary | ICD-10-CM

## 2019-12-26 NOTE — Progress Notes (Signed)
Daily Session Note  Patient Details  Name: Philip Mack MRN: [id number] Date of Birth: [birth year] Referring Provider:     Cardiac Rehab from 11/12/2019 in [facility] Cardiac and Pulmonary Rehab  Referring Provider  [name] [facility] Cardiologist Dr. [name]      Encounter Date: 12/26/2019  Check In: Session Check In - 12/26/19 0811      Check-In   Supervising physician immediately available to respond to emergencies  See telemetry face sheet for immediately available ER MD    Location  [facility]-Cardiac & Pulmonary Rehab    Staff Present  [name], RN, BSN, CCRP;Joseph Foy [place], [place], ACSM CEP, Exercise Physiologist    Virtual Visit  No    Medication changes reported      No    Fall or balance concerns reported     No    Warm-up and Cool-down  Performed on first and last piece of equipment    Resistance Training Performed  Yes    VAD Patient?  No    PAD/SET Patient?  No      Pain Assessment   Currently in Pain?  No/denies          Social History   Tobacco Use  Smoking Status Former Smoker  . Types: Cigarettes  . Quit date: 08/30/1980  . Years since quitting: 39.3  Smokeless Tobacco Never Used    Goals Met:  Independence with exercise equipment Exercise tolerated well No report of cardiac concerns or symptoms  Goals Unmet:  Not Applicable  Comments: Pt able to follow exercise prescription today without complaint.  Will continue to monitor for progression.    [name] is Medical Director for [place] and [facility].

## 2019-12-28 ENCOUNTER — Other Ambulatory Visit: Payer: Self-pay

## 2019-12-28 DIAGNOSIS — I214 Non-ST elevation (NSTEMI) myocardial infarction: Secondary | ICD-10-CM | POA: Diagnosis not present

## 2019-12-28 DIAGNOSIS — Z955 Presence of coronary angioplasty implant and graft: Secondary | ICD-10-CM | POA: Diagnosis not present

## 2019-12-28 NOTE — Progress Notes (Signed)
Daily Session Note  Patient Details  Name: [name] MRN: [id number] Date of Birth: Nov 19, 1950 Referring Provider:     Cardiac Rehab from 11/12/2019 in [facility] Cardiac and Pulmonary Rehab  Referring Provider  [name] [facility] Cardiologist Dr. [name]      Encounter Date: 12/28/2019  Check In:      Social History   Tobacco Use  Smoking Status Former Smoker  . Types: Cigarettes  . Quit date: 08/30/1980  . Years since quitting: 39.3  Smokeless Tobacco Never Used    Goals Met:  Independence with exercise equipment Exercise tolerated well No report of cardiac concerns or symptoms Strength training completed today  Goals Unmet:  Not Applicable  Comments: Pt able to follow exercise prescription today without complaint.  Will continue to monitor for progression.   [name] is Medical Director for [place] and [facility].

## 2019-12-31 ENCOUNTER — Other Ambulatory Visit: Payer: Self-pay

## 2019-12-31 ENCOUNTER — Encounter: Payer: Medicare Other | Attending: Internal Medicine | Admitting: *Deleted

## 2019-12-31 DIAGNOSIS — I214 Non-ST elevation (NSTEMI) myocardial infarction: Secondary | ICD-10-CM

## 2019-12-31 DIAGNOSIS — I252 Old myocardial infarction: Secondary | ICD-10-CM | POA: Diagnosis not present

## 2019-12-31 DIAGNOSIS — Z955 Presence of coronary angioplasty implant and graft: Secondary | ICD-10-CM | POA: Diagnosis not present

## 2019-12-31 NOTE — Progress Notes (Signed)
Daily Session Note  Patient Details  Name: Philip Mack MRN: [id number] Date of Birth: 1951/03/14 Referring Provider:     Cardiac Rehab from 11/12/2019 in [facility] Cardiac and Pulmonary Rehab  Referring Provider  [name] [facility] Cardiologist Dr. [name]      Encounter Date: 12/31/2019  Check In: Session Check In - 12/31/19 0822      Check-In   Supervising physician immediately available to respond to emergencies  See telemetry face sheet for immediately available ER MD    Location  [facility]-Cardiac & Pulmonary Rehab    Staff Present  [name], RN, BSN, CCRP;Joseph Hood RCP,RRT,BSRT;Kelly [place], [place], ACSM CEP, Exercise Physiologist    Virtual Visit  No    Medication changes reported      No    Fall or balance concerns reported     No    Warm-up and Cool-down  Performed on first and last piece of equipment    Resistance Training Performed  Yes    VAD Patient?  No    PAD/SET Patient?  No      Pain Assessment   Currently in Pain?  No/denies          Social History   Tobacco Use  Smoking Status Former Smoker  . Types: Cigarettes  . Quit date: 08/30/1980  . Years since quitting: 39.3  Smokeless Tobacco Never Used    Goals Met:  Independence with exercise equipment Exercise tolerated well No report of cardiac concerns or symptoms  Goals Unmet:  Not Applicable  Comments: Pt able to follow exercise prescription today without complaint.  Will continue to monitor for progression.    [name] is Medical Director for [place] and [facility].

## 2020-01-02 ENCOUNTER — Encounter: Payer: Medicare Other | Admitting: *Deleted

## 2020-01-02 ENCOUNTER — Other Ambulatory Visit: Payer: Self-pay

## 2020-01-02 DIAGNOSIS — I252 Old myocardial infarction: Secondary | ICD-10-CM | POA: Diagnosis not present

## 2020-01-02 DIAGNOSIS — I214 Non-ST elevation (NSTEMI) myocardial infarction: Secondary | ICD-10-CM

## 2020-01-02 DIAGNOSIS — Z955 Presence of coronary angioplasty implant and graft: Secondary | ICD-10-CM | POA: Diagnosis not present

## 2020-01-02 NOTE — Progress Notes (Signed)
Daily Session Note  Patient Details  Name: Philip Mack MRN: [id number] Date of Birth: 09-18-1950 Referring Provider:     Cardiac Rehab from 11/12/2019 in [facility] Cardiac and Pulmonary Rehab  Referring Provider  [name] [facility] Cardiologist Dr. [name]      Encounter Date: 01/02/2020  Check In: Session Check In - 01/02/20 0922      Check-In   Supervising physician immediately available to respond to emergencies  See telemetry face sheet for immediately available ER MD    Location  [facility]-Cardiac & Pulmonary Rehab    Staff Present  Nada Maclachlan, BA, ACSM CEP, Exercise Physiologist;Jessica Luan [place]ing, MA, RCEP, CCRP, CCET;Melissa Caiola RDN, LDN;Joseph Hood RCP,RRT,BSRT;Other   [employer] RN,BSN   Virtual Visit  No    Medication changes reported      No    Fall or balance concerns reported     No    Warm-up and Cool-down  Performed on first and last piece of equipment    Resistance Training Performed  Yes    VAD Patient?  No    PAD/SET Patient?  No      Pain Assessment   Currently in Pain?  No/denies          Social History   Tobacco Use  Smoking Status Former Smoker  . Types: Cigarettes  . Quit date: 08/30/1980  . Years since quitting: 39.3  Smokeless Tobacco Never Used    Goals Met:  Independence with exercise equipment Exercise tolerated well No report of cardiac concerns or symptoms  Goals Unmet:  Not Applicable  Comments: Pt able to follow exercise prescription today without complaint.  Will continue to monitor for progression.    [name] is Medical Director for [place] and [facility].

## 2020-01-03 ENCOUNTER — Other Ambulatory Visit: Payer: Self-pay | Admitting: Radiology

## 2020-01-03 ENCOUNTER — Ambulatory Visit
Admission: RE | Admit: 2020-01-03 | Discharge: 2020-01-03 | Disposition: A | Discharge status: Home / Self Care | Payer: Medicare Other | Source: Ambulatory Visit | Attending: Urology | Admitting: Urology

## 2020-01-03 ENCOUNTER — Ambulatory Visit
Admission: RE | Admit: 2020-01-03 | Discharge: 2020-01-03 | Disposition: A | Discharge status: Home / Self Care | Payer: Medicare Other | Attending: Urology | Admitting: Urology

## 2020-01-03 ENCOUNTER — Ambulatory Visit (INDEPENDENT_AMBULATORY_CARE_PROVIDER_SITE_OTHER): Payer: Medicare Other | Admitting: Urology

## 2020-01-03 ENCOUNTER — Other Ambulatory Visit: Payer: Self-pay

## 2020-01-03 ENCOUNTER — Encounter: Payer: Self-pay | Admitting: Urology

## 2020-01-03 VITALS — BP 101/57 | HR 59 | Ht 76.0 in | Wt 210.0 lb

## 2020-01-03 DIAGNOSIS — N201 Calculus of ureter: Secondary | ICD-10-CM

## 2020-01-03 DIAGNOSIS — N132 Hydronephrosis with renal and ureteral calculous obstruction: Secondary | ICD-10-CM | POA: Diagnosis not present

## 2020-01-03 DIAGNOSIS — I2510 Atherosclerotic heart disease of native coronary artery without angina pectoris: Secondary | ICD-10-CM | POA: Diagnosis not present

## 2020-01-03 LAB — URINALYSIS, COMPLETE
Bilirubin, UA: NEGATIVE
Glucose, UA: NEGATIVE
Ketones, UA: NEGATIVE
Leukocytes,UA: NEGATIVE
Nitrite, UA: NEGATIVE
Protein,UA: NEGATIVE
RBC, UA: NEGATIVE
Specific Gravity, UA: 1.025 (ref 1.005–1.030)
Urobilinogen, Ur: 0.2 mg/dL (ref 0.2–1.0)
pH, UA: 5.5 (ref 5.0–7.5)

## 2020-01-03 LAB — MICROSCOPIC EXAMINATION
Bacteria, UA: NONE SEEN
Epithelial Cells (non renal): NONE SEEN /hpf (ref 0–10)
RBC, Urine: NONE SEEN /hpf (ref 0–2)

## 2020-01-03 NOTE — Progress Notes (Signed)
01/03/2020 10:55 AM   Philip Mack [birth year] [id number]  Referring provider: Venia Carbon, MD [address] [place],  [place] [address]  Chief Complaint  Patient presents with  . Nephrolithiasis    HPI: [name] is a 69 year old male with nephrolithiasis who is status post ESWL for a left distal ureteral stone.  Contrast CT 10/23/2019 revealed the adrenal glands are unremarkable. There is a 7 mm distal left ureteral calculus with mild left hydronephrosis. Several additional punctate nonobstructing left renal inferior pole calculi noted. Probable punctate non obstructing stone in the upper pole of the right kidney. There is no hydronephrosis on the right. There is delayed enhancement and excretion of contrast by the left kidney. Left perinephric stranding noted. A 7 mm distal left ureteral calculus with mild left hydronephrosis. Correlation with urinalysis recommended to exclude superimposed UTI.  The right ureter and urinary bladder appear unremarkable.  He decided to pursue MET, but he failed to pass the stone.    KUB 12/20/2019 6 mm calcification projecting over the distal third of the left ureter.  He then underwent ESWL on 12/20/2019 with Dr. Erlene Quan.  He has not passed a fragment.  KUB 01/03/2020 Stable distal left ureteral stone.  He is not experiencing any discomfort today.  He is having frequency of urination.  Patient denies any modifying or aggravating factors.  Patient denies any gross hematuria, dysuria or suprapubic/flank pain.  Patient denies any fevers, chills, nausea or vomiting.   His UA benign.   PMH: Past Medical History:  Diagnosis Date  . Cataract   . Hepatitis   . Hyperlipidemia     Surgical History: Past Surgical History:  Procedure Laterality Date  . Arm injury  1975  . COLONOSCOPY    . CORONARY STENT INTERVENTION N/A 10/15/2019   Procedure: CORONARY STENT INTERVENTION;  Surgeon: Philip Kida, MD;  Location: [place] [facility];  Service: Cardiovascular;  Laterality: N/A;  . EXTRACORPOREAL SHOCK WAVE LITHOTRIPSY Left 12/20/2019   Procedure: EXTRACORPOREAL SHOCK WAVE LITHOTRIPSY (ESWL);  Surgeon: Philip Espy, MD;  Location: [facility] ORS;  Service: Urology;  Laterality: Left;  . HERNIA REPAIR  1959   Bilateral, inguinal  . LEFT HEART CATH AND CORONARY ANGIOGRAPHY N/A 10/15/2019   Procedure: LEFT HEART CATH AND CORONARY ANGIOGRAPHY;  Surgeon: Philip Skains, MD;  Location: [facility];  Service: Cardiovascular;  Laterality: N/A;  . POLYPECTOMY    . TONSILLECTOMY AND ADENOIDECTOMY  1961    Home Medications:  Allergies as of 01/03/2020   No Known Allergies     Medication List       Accurate as of Jan 03, 2020 10:55 AM. If you have any questions, ask your nurse or doctor.        STOP taking these medications   ondansetron 4 MG disintegrating tablet Commonly known as: Zofran ODT Stopped by: Philip Parker, PA-C   oxyCODONE-acetaminophen 5-325 MG tablet Commonly known as: PERCOCET/ROXICET Stopped by: Philip Council, PA-C     TAKE these medications   aspirin 81 MG chewable tablet Chew 1 tablet (81 mg total) by mouth daily.   atorvastatin 80 MG tablet Commonly known as: LIPITOR Take 1 tablet by mouth daily.   metoprolol tartrate 25 MG tablet Commonly known as: LOPRESSOR Take 0.5 tablets (12.5 mg total) by mouth 2 (two) times daily.   tamsulosin 0.4 MG Caps capsule Commonly known as: Flomax Take 1 capsule (0.4 mg total) by mouth daily.   ticagrelor  90 MG Tabs tablet Commonly known as: BRILINTA Take 90 mg by mouth 2 (two) times daily.       Allergies: No Known Allergies  Family History: Family History  Problem Relation Age of Onset  . Cancer Mother        Lung, (surgically resected)  . Heart disease Mother   . Hearing loss Other        On Mom's side  . Hypertension Neg Hx   . Diabetes Neg Hx   . Colon cancer Neg Hx   . Colon polyps Neg Hx   . Stomach cancer  Neg Hx   . Rectal cancer Neg Hx     Social History:  reports that he quit smoking about 39 years ago. His smoking use included cigarettes. He has never used smokeless tobacco. He reports current alcohol use. He reports that he does not use drugs.  ROS: Pertinent ROS in HPI  Physical Exam: BP (!) 101/57   Pulse (!) 59   Ht '6\' 4"'  (1.93 m)   Wt 210 lb (95.3 kg)   BMI 25.56 kg/m   Constitutional:  Well nourished. Alert and oriented, No acute distress. HEENT: [place] AT, mask in place.   Trachea midline. Cardiovascular: No clubbing, cyanosis, or edema. Respiratory: Normal respiratory effort, no increased work of breathing. Neurologic: Grossly intact, no focal deficits, moving all 4 extremities. Psychiatric: Normal mood and affect.  Laboratory Data: Lab Results  Component Value Date   WBC 10.4 10/23/2019   HGB 14.8 10/23/2019   HCT 43.6 10/23/2019   MCV 89.3 10/23/2019   PLT 202 10/23/2019    Lab Results  Component Value Date   CREATININE 1.57 (H) 10/23/2019    Lab Results  Component Value Date   PSA 0.31 07/31/2019   PSA 0.36 07/15/2017   PSA 0.28 07/14/2015    Lab Results  Component Value Date   TSH 1.23 03/30/2012       Component Value Date/Time   CHOL 231 (H) 10/12/2019 2154   HDL 67 10/12/2019 2154   CHOLHDL 3.4 10/12/2019 2154   VLDL 8 10/12/2019 2154   LDLCALC 156 (H) 10/12/2019 2154    Lab Results  Component Value Date   AST 27 07/31/2019   Lab Results  Component Value Date   ALT 23 07/31/2019    Urinalysis Component     Latest Ref Rng & Units 01/03/2020  Specific Gravity, UA     1.005 - 1.030 1.025  pH, UA     5.0 - 7.5 5.5  Color, UA     Yellow Yellow  Appearance Ur     Clear Clear  Leukocytes,UA     Negative Negative  Protein,UA     Negative/Trace Negative  Glucose, UA     Negative Negative  Ketones, UA     Negative Negative  RBC, UA     Negative Negative  Bilirubin, UA     Negative Negative  Urobilinogen, Ur     0.2 - 1.0  mg/dL 0.2  Nitrite, UA     Negative Negative  Microscopic Examination      See below:   Component     Latest Ref Rng & Units 01/03/2020  WBC, UA     0 - 5 /hpf 0-5  RBC     0 - 2 /hpf None seen  Epithelial Cells (non renal)     0 - 10 /hpf None seen  Bacteria, UA     None seen/Few None seen  I have reviewed the labs.   Pertinent Imaging: CLINICAL DATA:  Post lithotripsy on left.  EXAM: ABDOMEN - 1 VIEW  COMPARISON:  12/20/2019  FINDINGS: Calcifications in the left side of the pelvis remain present and unchanged since prior study. This is likely the 7 mm distal left ureteral stone and adjacent phlebolith. Nonobstructive bowel gas pattern. No organomegaly or free air.  IMPRESSION: Stable distal left ureteral stone.   Electronically Signed   By: Rolm Baptise M.D.   On: 01/03/2020 09:39 I have independently reviewed the films and the distal stone still persists  Assessment & Plan:    1. Left ureteral stone Failed MET Failed ESWL Schedule left ureteroscopy with laser lithotripsy and ureteral stent placement UA Urine culture  Explained to the patient how the procedure is performed and the risks involved Informed patient that they will have a stent placed during the procedure and will remain in place after the procedure for a short time.  Stent may be removed in the office with a cystoscope or patient may be instructed to remove the stent themselves by the string Described "stent pain" as feelings of needing to urinate/overactive bladder and a warm, tingling sensation to intense pain in the affected flank Residual stones within the kidney or ureter may be present after the procedure and may need to have these addressed at a different encounter Injury to the ureter is the most common intra-operative risk, it may result in an open procedure to correct the defect Infection and bleeding are also risks Advised to contact our office or seek treatment in the ED if  becomes febrile or pain/ vomiting are difficult control in order to arrange for emergent/urgent intervention  2. Left hydronephrosis Obtain RUS to ensure the hydronephrosis has resolved once they recovered from procedure to ensure to iatrogenic hydronephrosis does not remain   Return for left URS/LL/stent placement .  These notes generated with voice recognition software. I apologize for typographical errors.  Philip Council, PA-C  [facility] [address]  [place], [place] [address] [phone]  I spent 15 minutes on the day of the encounter to include pre-visit record review, face-to-face time with the patient, and post-visit ordering of tests.

## 2020-01-03 NOTE — H&P (View-Only) (Signed)
01/03/2020 10:55 AM   Philip Mack 1950-11-06 [id number]  Referring provider: Venia Carbon, MD [address] [place],   [address]  Chief Complaint  Patient presents with  . Nephrolithiasis    HPI: [name] is a 69 year old male with nephrolithiasis who is status post ESWL for a left distal ureteral stone.  Contrast CT 10/23/2019 revealed the adrenal glands are unremarkable. There is a 7 mm distal left ureteral calculus with mild left hydronephrosis. Several additional punctate nonobstructing left renal inferior pole calculi noted. Probable punctate non obstructing stone in the upper pole of the right kidney. There is no hydronephrosis on the right. There is delayed enhancement and excretion of contrast by the left kidney. Left perinephric stranding noted. A 7 mm distal left ureteral calculus with mild left hydronephrosis. Correlation with urinalysis recommended to exclude superimposed UTI.  The right ureter and urinary bladder appear unremarkable.  He decided to pursue MET, but he failed to pass the stone.    KUB 12/20/2019 6 mm calcification projecting over the distal third of the left ureter.  He then underwent ESWL on 12/20/2019 with Dr. Erlene Quan.  He has not passed a fragment.  KUB 01/03/2020 Stable distal left ureteral stone.  He is not experiencing any discomfort today.  He is having frequency of urination.  Patient denies any modifying or aggravating factors.  Patient denies any gross hematuria, dysuria or suprapubic/flank pain.  Patient denies any fevers, chills, nausea or vomiting.   His UA benign.   PMH: Past Medical History:  Diagnosis Date  . Cataract   . Hepatitis   . Hyperlipidemia     Surgical History: Past Surgical History:  Procedure Laterality Date  . Arm injury  1975  . COLONOSCOPY    . CORONARY STENT INTERVENTION N/A 10/15/2019   Procedure: CORONARY STENT INTERVENTION;  Surgeon: Yolonda Kida, MD;  Location: [facility];  Service: Cardiovascular;  Laterality: N/A;  . EXTRACORPOREAL SHOCK WAVE LITHOTRIPSY Left 12/20/2019   Procedure: EXTRACORPOREAL SHOCK WAVE LITHOTRIPSY (ESWL);  Surgeon: Hollice Espy, MD;  Location: [facility] ORS;  Service: Urology;  Laterality: Left;  . HERNIA REPAIR  1959   Bilateral, inguinal  . LEFT HEART CATH AND CORONARY ANGIOGRAPHY N/A 10/15/2019   Procedure: LEFT HEART CATH AND CORONARY ANGIOGRAPHY;  Surgeon: Corey Skains, MD;  Location: [facility];  Service: Cardiovascular;  Laterality: N/A;  . POLYPECTOMY    . TONSILLECTOMY AND ADENOIDECTOMY  1961    Home Medications:  Allergies as of 01/03/2020   No Known Allergies     Medication List       Accurate as of Jan 03, 2020 10:55 AM. If you have any questions, ask your nurse or doctor.        STOP taking these medications   ondansetron 4 MG disintegrating tablet Commonly known as: Zofran ODT Stopped by: Deana Krock, PA-C   oxyCODONE-acetaminophen 5-325 MG tablet Commonly known as: PERCOCET/ROXICET Stopped by: Zara Council, PA-C     TAKE these medications   aspirin 81 MG chewable tablet Chew 1 tablet (81 mg total) by mouth daily.   atorvastatin 80 MG tablet Commonly known as: LIPITOR Take 1 tablet by mouth daily.   metoprolol tartrate 25 MG tablet Commonly known as: LOPRESSOR Take 0.5 tablets (12.5 mg total) by mouth 2 (two) times daily.   tamsulosin 0.4 MG Caps capsule Commonly known as: Flomax Take 1 capsule (0.4 mg total) by mouth daily.   ticagrelor  90 MG Tabs tablet Commonly known as: BRILINTA Take 90 mg by mouth 2 (two) times daily.       Allergies: No Known Allergies  Family History: Family History  Problem Relation Age of Onset  . Cancer Mother        Lung, (surgically resected)  . Heart disease Mother   . Hearing loss Other        On Mom's side  . Hypertension Neg Hx   . Diabetes Neg Hx   . Colon cancer Neg Hx   . Colon polyps Neg Hx   . Stomach cancer  Neg Hx   . Rectal cancer Neg Hx     Social History:  reports that he quit smoking about 39 years ago. His smoking use included cigarettes. He has never used smokeless tobacco. He reports current alcohol use. He reports that he does not use drugs.  ROS: Pertinent ROS in HPI  Physical Exam: BP (!) 101/57   Pulse (!) 59   Ht '6\' 4"'  (1.93 m)   Wt 210 lb (95.3 kg)   BMI 25.56 kg/m   Constitutional:  Well nourished. Alert and oriented, No acute distress. HEENT: [place] AT, mask in place.   Trachea midline. Cardiovascular: No clubbing, cyanosis, or edema. Respiratory: Normal respiratory effort, no increased work of breathing. Neurologic: Grossly intact, no focal deficits, moving all 4 extremities. Psychiatric: Normal mood and affect.  Laboratory Data: Lab Results  Component Value Date   WBC 10.4 10/23/2019   HGB 14.8 10/23/2019   HCT 43.6 10/23/2019   MCV 89.3 10/23/2019   PLT 202 10/23/2019    Lab Results  Component Value Date   CREATININE 1.57 (H) 10/23/2019    Lab Results  Component Value Date   PSA 0.31 07/31/2019   PSA 0.36 07/15/2017   PSA 0.28 07/14/2015    Lab Results  Component Value Date   TSH 1.23 03/30/2012       Component Value Date/Time   CHOL 231 (H) 10/12/2019 2154   HDL 67 10/12/2019 2154   CHOLHDL 3.4 10/12/2019 2154   VLDL 8 10/12/2019 2154   LDLCALC 156 (H) 10/12/2019 2154    Lab Results  Component Value Date   AST 27 07/31/2019   Lab Results  Component Value Date   ALT 23 07/31/2019    Urinalysis Component     Latest Ref Rng & Units 01/03/2020  Specific Gravity, UA     1.005 - 1.030 1.025  pH, UA     5.0 - 7.5 5.5  Color, UA     Yellow Yellow  Appearance Ur     Clear Clear  Leukocytes,UA     Negative Negative  Protein,UA     Negative/Trace Negative  Glucose, UA     Negative Negative  Ketones, UA     Negative Negative  RBC, UA     Negative Negative  Bilirubin, UA     Negative Negative  Urobilinogen, Ur     0.2 - 1.0  mg/dL 0.2  Nitrite, UA     Negative Negative  Microscopic Examination      See below:   Component     Latest Ref Rng & Units 01/03/2020  WBC, UA     0 - 5 /hpf 0-5  RBC     0 - 2 /hpf None seen  Epithelial Cells (non renal)     0 - 10 /hpf None seen  Bacteria, UA     None seen/Few None seen  I have reviewed the labs.   Pertinent Imaging: CLINICAL DATA:  Post lithotripsy on left.  EXAM: ABDOMEN - 1 VIEW  COMPARISON:  12/20/2019  FINDINGS: Calcifications in the left side of the pelvis remain present and unchanged since prior study. This is likely the 7 mm distal left ureteral stone and adjacent phlebolith. Nonobstructive bowel gas pattern. No organomegaly or free air.  IMPRESSION: Stable distal left ureteral stone.   Electronically Signed   By: Rolm Baptise M.D.   On: 01/03/2020 09:39 I have independently reviewed the films and the distal stone still persists  Assessment & Plan:    1. Left ureteral stone Failed MET Failed ESWL Schedule left ureteroscopy with laser lithotripsy and ureteral stent placement UA Urine culture  Explained to the patient how the procedure is performed and the risks involved Informed patient that they will have a stent placed during the procedure and will remain in place after the procedure for a short time.  Stent may be removed in the office with a cystoscope or patient may be instructed to remove the stent themselves by the string Described "stent pain" as feelings of needing to urinate/overactive bladder and a warm, tingling sensation to intense pain in the affected flank Residual stones within the kidney or ureter may be present after the procedure and may need to have these addressed at a different encounter Injury to the ureter is the most common intra-operative risk, it may result in an open procedure to correct the defect Infection and bleeding are also risks Advised to contact our office or seek treatment in the ED if  becomes febrile or pain/ vomiting are difficult control in order to arrange for emergent/urgent intervention  2. Left hydronephrosis Obtain RUS to ensure the hydronephrosis has resolved once they recovered from procedure to ensure to iatrogenic hydronephrosis does not remain   Return for left URS/LL/stent placement .  These notes generated with voice recognition software. I apologize for typographical errors.  Zara Council, PA-C  [facility] [address]  [place], [place] [address] [phone]  I spent 15 minutes on the day of the encounter to include pre-visit record review, face-to-face time with the patient, and post-visit ordering of tests.

## 2020-01-04 ENCOUNTER — Encounter
Admission: RE | Admit: 2020-01-04 | Discharge: 2020-01-04 | Disposition: A | Discharge status: Home / Self Care | Payer: Medicare Other | Source: Ambulatory Visit | Attending: Urology | Admitting: Urology

## 2020-01-04 ENCOUNTER — Encounter: Payer: Medicare Other | Admitting: *Deleted

## 2020-01-04 ENCOUNTER — Other Ambulatory Visit: Admission: RE | Admit: 2020-01-04 | Payer: Medicare Other | Source: Ambulatory Visit

## 2020-01-04 ENCOUNTER — Other Ambulatory Visit: Payer: Self-pay

## 2020-01-04 DIAGNOSIS — I214 Non-ST elevation (NSTEMI) myocardial infarction: Secondary | ICD-10-CM

## 2020-01-04 DIAGNOSIS — Z955 Presence of coronary angioplasty implant and graft: Secondary | ICD-10-CM | POA: Diagnosis not present

## 2020-01-04 DIAGNOSIS — I252 Old myocardial infarction: Secondary | ICD-10-CM | POA: Diagnosis not present

## 2020-01-04 HISTORY — DX: Personal history of urinary calculi: Z87.442

## 2020-01-04 HISTORY — DX: Acute myocardial infarction, unspecified: I21.9

## 2020-01-04 NOTE — Progress Notes (Signed)
Daily Session Note  Patient Details  Name: Philip Mack MRN: [id number] Date of Birth: [birth year] Referring Provider:     Cardiac Rehab from 11/12/2019 in [facility] Cardiac and Pulmonary Rehab  Referring Provider  [name] [facility] Cardiologist Dr. [name]      Encounter Date: 01/04/2020  Check In: Session Check In - 01/04/20 0837      Check-In   Supervising physician immediately available to respond to emergencies  See telemetry face sheet for immediately available ER MD    Location  [facility]-Cardiac & Pulmonary Rehab    Staff Present  [name], RN, BSN, CCRP;Joseph Hood RCP,RRT,BSRT;Jessica [place], [place], [place], [place], CCET    Virtual Visit  No    Medication changes reported      No    Fall or balance concerns reported     No    Warm-up and Cool-down  Performed on first and last piece of equipment    Resistance Training Performed  Yes    VAD Patient?  No    PAD/SET Patient?  No      Pain Assessment   Currently in Pain?  No/denies          Social History   Tobacco Use  Smoking Status Former Smoker  . Types: Cigarettes  . Quit date: 08/30/1980  . Years since quitting: 39.3  Smokeless Tobacco Never Used    Goals Met:  Independence with exercise equipment Exercise tolerated well No report of cardiac concerns or symptoms  Goals Unmet:  Not Applicable  Comments: Pt able to follow exercise prescription today without complaint.  Will continue to monitor for progression.    [name] is Medical Director for [facility] [facility] and [facility].

## 2020-01-04 NOTE — Patient Instructions (Signed)
Your procedure is scheduled on: 01/08/20 Report to [facility]. To find out your arrival time please call [phone] between 1PM - 3PM on 01/07/20.  Remember: Instructions that are not followed completely may result in serious medical risk, up to and including death, or upon the discretion of your surgeon and anesthesiologist your surgery may need to be rescheduled.     _X__ 1. Do not eat food after midnight the night before your procedure.                 No gum chewing or hard candies. You may drink clear liquids up to 2 hours                 before you are scheduled to arrive for your surgery- DO not drink clear                 liquids within 2 hours of the start of your surgery.                 Clear Liquids include:  water, apple juice without pulp, clear carbohydrate                 drink such as Clearfast or Gatorade, Black Coffee or Tea (Do not add                 anything to coffee or tea). Diabetics water only  __X__2.  On the morning of surgery brush your teeth with toothpaste and water, you                 may rinse your mouth with mouthwash if you wish.  Do not swallow any              toothpaste of mouthwash.     _X__ 3.  No Alcohol for 24 hours before or after surgery.   _X__ 4.  Do Not Smoke or use e-cigarettes For 24 Hours Prior to Your Surgery.                 Do not use any chewable tobacco products for at least 6 hours prior to                 surgery.  ____  5.  Bring all medications with you on the day of surgery if instructed.   __X__  6.  Notify your doctor if there is any change in your medical condition      (cold, fever, infections).     Do not wear jewelry, make-up, hairpins, clips or nail polish. Do not wear lotions, powders, or perfumes.  Do not shave 48 hours prior to surgery. Men may shave face and neck. Do not bring valuables to the hospital.    [facility] is not responsible for any belongings or  valuables.  Contacts, dentures/partials or body piercings may not be worn into surgery. Bring a case for your contacts, glasses or hearing aids, a denture cup will be supplied. Leave your suitcase in the car. After surgery it may be brought to your room. For patients admitted to the hospital, discharge time is determined by your treatment team.   Patients discharged the day of surgery will not be allowed to drive home.   Please read over the following fact sheets that you were given:   MRSA Information  __X__ Take these medicines the morning of surgery with A SIP OF WATER:  1. metoprolol tartrate (LOPRESSOR) 12.5 MG tablet  2. tamsulosin (FLOMAX) 0.4 MG CAPS capsule  3.   4.  5.  6.  ____ Fleet Enema (as directed)   __X__ Use CHG Soap/SAGE wipes as directed  ____ Use inhalers on the day of surgery  ____ Stop metformin/Janumet/Farxiga 2 days prior to surgery    ____ Take 1/2 of usual insulin dose the night before surgery. No insulin the morning          of surgery.   __X__ Stop Blood Thinners AS INSTRUCTED PREVIOUSLY  __X__ Stop Anti-inflammatories 7 days before surgery such as Advil, Ibuprofen, Motrin,  BC or Goodies Powder, Naprosyn, Naproxen, Aleve   __X__ Stop all herbal supplements, fish oil or vitamin E until after surgery.    ____ Bring C-Pap to the hospital.

## 2020-01-04 NOTE — Pre-Procedure Instructions (Signed)
Reviewed with Dr Ronelle Nigh patient's recent history of NSTEMI ( 10/2019) and cardiology clearance notes  (11/2019) from Dr Nehemiah Massed.

## 2020-01-07 ENCOUNTER — Other Ambulatory Visit
Admission: RE | Admit: 2020-01-07 | Discharge: 2020-01-07 | Disposition: A | Discharge status: Home / Self Care | Payer: Medicare Other | Source: Ambulatory Visit | Attending: Urology | Admitting: Urology

## 2020-01-07 ENCOUNTER — Encounter: Payer: Medicare Other | Admitting: *Deleted

## 2020-01-07 ENCOUNTER — Other Ambulatory Visit: Payer: Self-pay

## 2020-01-07 DIAGNOSIS — Z955 Presence of coronary angioplasty implant and graft: Secondary | ICD-10-CM

## 2020-01-07 DIAGNOSIS — Z01812 Encounter for preprocedural laboratory examination: Secondary | ICD-10-CM | POA: Diagnosis not present

## 2020-01-07 DIAGNOSIS — Z20822 Contact with and (suspected) exposure to covid-19: Secondary | ICD-10-CM | POA: Diagnosis not present

## 2020-01-07 DIAGNOSIS — I214 Non-ST elevation (NSTEMI) myocardial infarction: Secondary | ICD-10-CM

## 2020-01-07 DIAGNOSIS — I252 Old myocardial infarction: Secondary | ICD-10-CM | POA: Diagnosis not present

## 2020-01-07 LAB — BASIC METABOLIC PANEL
Anion gap: 6 (ref 5–15)
BUN: 12 mg/dL (ref 8–23)
CO2: 29 mmol/L (ref 22–32)
Calcium: 9.3 mg/dL (ref 8.9–10.3)
Chloride: 104 mmol/L (ref 98–111)
Creatinine, Ser: 0.73 mg/dL (ref 0.61–1.24)
GFR calc Af Amer: >60 mL/min (ref >60)
GFR calc non Af Amer: >60 mL/min (ref >60)
Glucose, Bld: 97 mg/dL (ref 70–99)
Potassium: 4.3 mmol/L (ref 3.5–5.1)
Sodium: 139 mmol/L (ref 135–145)

## 2020-01-07 LAB — CBC
HCT: 41.1 % (ref 39.0–52.0)
Hemoglobin: 14 g/dL (ref 13.0–17.0)
MCH: 30.4 pg (ref 26.0–34.0)
MCHC: 34.1 g/dL (ref 30.0–36.0)
MCV: 89.2 fL (ref 80.0–100.0)
Platelets: 184 10*3/uL (ref 150–400)
RBC: 4.61 MIL/uL (ref 4.22–5.81)
RDW: 13.4 % (ref 11.5–15.5)
WBC: 5 10*3/uL (ref 4.0–10.5)
nRBC: 0 % (ref 0.0–0.2)

## 2020-01-07 LAB — SARS CORONAVIRUS 2 (TAT 6-24 HRS): SARS Coronavirus 2: NEGATIVE

## 2020-01-07 NOTE — Progress Notes (Signed)
Daily Session Note  Patient Details  Name: Philip Mack MRN: [id number] Date of Birth: 1951-05-21 Referring Provider:     Cardiac Rehab from 11/12/2019 in [facility] Cardiac and Pulmonary Rehab  Referring Provider  [name] [facility] Cardiologist Dr. [name]      Encounter Date: 01/07/2020  Check In: Session Check In - 01/07/20 0954      Check-In   Supervising physician immediately available to respond to emergencies  See telemetry face sheet for immediately available ER MD    Location  [facility]-Cardiac & Pulmonary Rehab    Staff Present  [name], RN, BSN, CCRP;Joseph Hood RCP,RRT,BSRT;Kelly Montgomery, [place], ACSM CEP, Exercise Physiologist    Virtual Visit  No    Medication changes reported      No    Fall or balance concerns reported     No    Warm-up and Cool-down  Performed on first and last piece of equipment    Resistance Training Performed  Yes    VAD Patient?  No    PAD/SET Patient?  No      Pain Assessment   Currently in Pain?  No/denies          Social History   Tobacco Use  Smoking Status Former Smoker  . Types: Cigarettes  . Quit date: 08/30/1980  . Years since quitting: 39.3  Smokeless Tobacco Never Used    Goals Met:  Independence with exercise equipment Exercise tolerated well No report of cardiac concerns or symptoms  Goals Unmet:  Not Applicable  Comments: Pt able to follow exercise prescription today without complaint.  Will continue to monitor for progression.    [name] is Medical Director for [place] and [facility].

## 2020-01-08 ENCOUNTER — Ambulatory Visit: Payer: Medicare Other | Admitting: Anesthesiology

## 2020-01-08 ENCOUNTER — Encounter: Payer: Self-pay | Admitting: Urology

## 2020-01-08 ENCOUNTER — Ambulatory Visit: Payer: Medicare Other

## 2020-01-08 ENCOUNTER — Other Ambulatory Visit: Payer: Self-pay | Admitting: Internal Medicine

## 2020-01-08 ENCOUNTER — Ambulatory Visit
Admission: RE | Admit: 2020-01-08 | Discharge: 2020-01-08 | Disposition: A | Discharge status: Home / Self Care | Payer: Medicare Other | Attending: Urology | Admitting: Urology

## 2020-01-08 ENCOUNTER — Encounter
Admission: RE | Disposition: A | Discharge status: Home / Self Care | Payer: Self-pay | Source: Home / Self Care | Attending: Urology

## 2020-01-08 DIAGNOSIS — Z79899 Other long term (current) drug therapy: Secondary | ICD-10-CM | POA: Diagnosis not present

## 2020-01-08 DIAGNOSIS — Z87891 Personal history of nicotine dependence: Secondary | ICD-10-CM | POA: Diagnosis not present

## 2020-01-08 DIAGNOSIS — N201 Calculus of ureter: Secondary | ICD-10-CM

## 2020-01-08 DIAGNOSIS — E785 Hyperlipidemia, unspecified: Secondary | ICD-10-CM | POA: Diagnosis not present

## 2020-01-08 DIAGNOSIS — I2510 Atherosclerotic heart disease of native coronary artery without angina pectoris: Secondary | ICD-10-CM | POA: Diagnosis not present

## 2020-01-08 DIAGNOSIS — N132 Hydronephrosis with renal and ureteral calculous obstruction: Secondary | ICD-10-CM | POA: Insufficient documentation

## 2020-01-08 DIAGNOSIS — I252 Old myocardial infarction: Secondary | ICD-10-CM | POA: Insufficient documentation

## 2020-01-08 DIAGNOSIS — Z955 Presence of coronary angioplasty implant and graft: Secondary | ICD-10-CM | POA: Insufficient documentation

## 2020-01-08 DIAGNOSIS — Z87442 Personal history of urinary calculi: Secondary | ICD-10-CM | POA: Diagnosis not present

## 2020-01-08 HISTORY — PX: CYSTOSCOPY/URETEROSCOPY/HOLMIUM LASER/STENT PLACEMENT: SHX6546

## 2020-01-08 SURGERY — CYSTOSCOPY/URETEROSCOPY/HOLMIUM LASER/STENT PLACEMENT
Anesthesia: General | Laterality: Left

## 2020-01-08 MED ORDER — FAMOTIDINE 20 MG PO TABS
ORAL_TABLET | ORAL | Status: AC
  Administered 2020-01-08: 08:00:00 20 mg via ORAL
  Filled 2020-01-08: qty 1

## 2020-01-08 MED ORDER — OXYBUTYNIN CHLORIDE 5 MG PO TABS
ORAL_TABLET | ORAL | 0 refills | Status: DC
Start: 2020-01-08 — End: 2020-02-14

## 2020-01-08 MED ORDER — PROPOFOL 10 MG/ML IV BOLUS
INTRAVENOUS | Status: AC
  Filled 2020-01-08: qty 20

## 2020-01-08 MED ORDER — PROPOFOL 10 MG/ML IV BOLUS
INTRAVENOUS | Status: DC | PRN
  Administered 2020-01-08: 160 mg via INTRAVENOUS

## 2020-01-08 MED ORDER — MEPERIDINE HCL 50 MG/ML IJ SOLN: 6.2500 mg | INTRAMUSCULAR | Status: DC | PRN

## 2020-01-08 MED ORDER — OXYCODONE HCL 5 MG PO TABS: 5.0000 mg | ORAL_TABLET | Freq: Once | ORAL | Status: DC | PRN

## 2020-01-08 MED ORDER — MIDAZOLAM HCL 2 MG/2ML IJ SOLN
INTRAMUSCULAR | Status: AC
  Filled 2020-01-08: qty 2

## 2020-01-08 MED ORDER — MIDAZOLAM HCL 2 MG/2ML IJ SOLN
INTRAMUSCULAR | Status: DC | PRN
  Administered 2020-01-08: 2 mg via INTRAVENOUS

## 2020-01-08 MED ORDER — LIDOCAINE HCL (PF) 2 % IJ SOLN
INTRAMUSCULAR | Status: AC
  Filled 2020-01-08: qty 5

## 2020-01-08 MED ORDER — OXYCODONE HCL 5 MG/5ML PO SOLN: 5.0000 mg | Freq: Once | ORAL | Status: DC | PRN

## 2020-01-08 MED ORDER — FENTANYL CITRATE (PF) 100 MCG/2ML IJ SOLN
INTRAMUSCULAR | Status: DC | PRN
  Administered 2020-01-08 (×4): 25 ug via INTRAVENOUS

## 2020-01-08 MED ORDER — FENTANYL CITRATE (PF) 100 MCG/2ML IJ SOLN
INTRAMUSCULAR | Status: AC
  Filled 2020-01-08: qty 2

## 2020-01-08 MED ORDER — DEXAMETHASONE SODIUM PHOSPHATE 10 MG/ML IJ SOLN
INTRAMUSCULAR | Status: DC | PRN
  Administered 2020-01-08: 5 mg via INTRAVENOUS

## 2020-01-08 MED ORDER — CEFAZOLIN SODIUM-DEXTROSE 1-4 GM/50ML-% IV SOLN
INTRAVENOUS | Status: AC
  Filled 2020-01-08: qty 50

## 2020-01-08 MED ORDER — FENTANYL CITRATE (PF) 100 MCG/2ML IJ SOLN: 25.0000 ug | INTRAMUSCULAR | Status: DC | PRN

## 2020-01-08 MED ORDER — LACTATED RINGERS IV SOLN: INTRAVENOUS | Status: DC

## 2020-01-08 MED ORDER — PROMETHAZINE HCL 25 MG/ML IJ SOLN: 6.2500 mg | INTRAMUSCULAR | Status: DC | PRN

## 2020-01-08 MED ORDER — EPHEDRINE SULFATE 50 MG/ML IJ SOLN
INTRAMUSCULAR | Status: DC | PRN
  Administered 2020-01-08: 10 mg via INTRAVENOUS

## 2020-01-08 MED ORDER — ONDANSETRON HCL 4 MG/2ML IJ SOLN
INTRAMUSCULAR | Status: DC | PRN
  Administered 2020-01-08: 4 mg via INTRAVENOUS

## 2020-01-08 MED ORDER — CEFAZOLIN SODIUM-DEXTROSE 1-4 GM/50ML-% IV SOLN
1.0000 g | INTRAVENOUS | Status: AC
  Administered 2020-01-08: 10:00:00 2 g via INTRAVENOUS

## 2020-01-08 MED ORDER — FAMOTIDINE 20 MG PO TABS: 20.0000 mg | ORAL_TABLET | Freq: Once | ORAL | Status: AC

## 2020-01-08 MED ORDER — IOPAMIDOL (ISOVUE-200) INJECTION 41%
INTRAVENOUS | Status: DC | PRN
  Administered 2020-01-08: 20 mL via INTRAVENOUS

## 2020-01-08 SURGICAL SUPPLY — 30 items
BAG DRAIN CYSTO-URO LG1000N (MISCELLANEOUS) ×3 IMPLANT
BASKET ZERO TIP 1.9FR (BASKET) ×2 IMPLANT
BRUSH SCRUB EZ 1% IODOPHOR (MISCELLANEOUS) ×3 IMPLANT
CATH URETL 5X70 OPEN END (CATHETERS) IMPLANT
CNTNR SPEC 2.5X3XGRAD LEK (MISCELLANEOUS) ×1
CONT SPEC 4OZ STER OR WHT (MISCELLANEOUS) ×2
CONTAINER SPEC 2.5X3XGRAD LEK (MISCELLANEOUS) IMPLANT
DRAPE UTILITY 15X26 TOWEL STRL (DRAPES) ×3 IMPLANT
DRSG TEGADERM 2-3/8X2-3/4 SM (GAUZE/BANDAGES/DRESSINGS) ×2 IMPLANT
FIBER LASER TRACTIP 200 (UROLOGICAL SUPPLIES) ×3 IMPLANT
GLOVE BIOGEL PI IND STRL 7.5 (GLOVE) ×1 IMPLANT
GLOVE BIOGEL PI INDICATOR 7.5 (GLOVE) ×2
GOWN STRL REUS W/ TWL LRG LVL3 (GOWN DISPOSABLE) ×1 IMPLANT
GOWN STRL REUS W/ TWL XL LVL3 (GOWN DISPOSABLE) ×1 IMPLANT
GOWN STRL REUS W/TWL LRG LVL3 (GOWN DISPOSABLE) ×2
GOWN STRL REUS W/TWL XL LVL3 (GOWN DISPOSABLE) ×2
GUIDEWIRE STR DUAL SENSOR (WIRE) ×3 IMPLANT
INFUSOR MANOMETER BAG 3000ML (MISCELLANEOUS) ×3 IMPLANT
INTRODUCER DILATOR DOUBLE (INTRODUCER) IMPLANT
KIT TURNOVER CYSTO (KITS) ×3 IMPLANT
PACK CYSTO AR (MISCELLANEOUS) ×3 IMPLANT
SET CYSTO W/LG BORE CLAMP LF (SET/KITS/TRAYS/PACK) ×3 IMPLANT
SHEATH URETERAL 12FRX35CM (MISCELLANEOUS) IMPLANT
SOL .9 NS 3000ML IRR  AL (IV SOLUTION) ×2
SOL .9 NS 3000ML IRR UROMATIC (IV SOLUTION) ×1 IMPLANT
STENT URET 6FRX24 CONTOUR (STENTS) IMPLANT
STENT URET 6FRX26 CONTOUR (STENTS) ×2 IMPLANT
SURGILUBE 2OZ TUBE FLIPTOP (MISCELLANEOUS) ×3 IMPLANT
VALVE UROSEAL ADJ ENDO (VALVE) IMPLANT
WATER STERILE IRR 1000ML POUR (IV SOLUTION) ×3 IMPLANT

## 2020-01-08 NOTE — Anesthesia Preprocedure Evaluation (Signed)
Anesthesia Evaluation  Patient identified by MRN, date of birth, ID band Patient awake    Reviewed: Allergy & Precautions, NPO status , Patient's Chart, lab work & pertinent test results  History of Anesthesia Complications Negative for: history of anesthetic complications  Airway Mallampati: II  TM Distance: <3 FB Neck ROM: Full    Dental no notable dental hx.    Pulmonary neg sleep apnea, neg COPD, former smoker,    breath sounds clear to auscultation- rhonchi (-) wheezing      Cardiovascular + CAD, + Past MI and + Cardiac Stents (LAD stent Feb 2021)  (-) CABG  Rhythm:Regular Rate:Normal - Systolic murmurs and - Diastolic murmurs Echo [id number]: MILD LV SYSTOLIC DYSFUNCTION  WITH MILD LVH NORMAL RIGHT VENTRICULAR SYSTOLIC FUNCTION MILD VALVULAR REGURGITATION  NO VALVULAR STENOSIS MILD MR, PR TRIVIAL TR EF 45-50%  NM stress test 12/06/19: Normal treadmill EKG without evidence of ischemia or arrhythmia Normal myocardial perfusion without evidence of myocardial ischemia   Neuro/Psych neg Seizures negative neurological ROS  negative psych ROS   GI/Hepatic negative GI ROS, Neg liver ROS,   Endo/Other  negative endocrine ROSneg diabetes  Renal/GU Renal disease: nephrolithiasis.     Musculoskeletal negative musculoskeletal ROS (+)   Abdominal (+) - obese,   Peds  Hematology negative hematology ROS (+)   Anesthesia Other Findings Past Medical History: No date: Cataract No date: Hepatitis     Comment:  Hep A? No date: History of kidney stones No date: Hyperlipidemia No date: Myocardial infarction [facility])     Comment:  10/12/19   Reproductive/Obstetrics                             Anesthesia Physical Anesthesia Plan  ASA: III  Anesthesia Plan: General   Post-op Pain Management:    Induction: Intravenous  PONV Risk Score and Plan: 1 and Ondansetron and Dexamethasone  Airway  Management Planned: LMA  Additional Equipment:   Intra-op Plan:   Post-operative Plan:   Informed Consent: I have reviewed the patients History and Physical, chart, labs and discussed the procedure including the risks, benefits and alternatives for the proposed anesthesia with the patient or authorized representative who has indicated his/her understanding and acceptance.     Dental advisory given  Plan Discussed with: CRNA and Anesthesiologist  Anesthesia Plan Comments: (Cleared for surgery by cardiologist, Dr. Nehemiah Massed, ok to d/c brilinta 5 days prior to surgery)        Anesthesia Quick Evaluation

## 2020-01-08 NOTE — Discharge Instructions (Signed)
  AMBULATORY SURGERY  DISCHARGE INSTRUCTIONS   1) The drugs that you were given will stay in your system until tomorrow so for the next 24 hours you should not:  A) Drive an automobile B) Make any legal decisions C) Drink any alcoholic beverage   2) You may resume regular meals tomorrow.  Today it is better to start with liquids and gradually work up to solid foods.  You may eat anything you prefer, but it is better to start with liquids, then soup and crackers, and gradually work up to solid foods.   3) Please notify your doctor immediately if you have any unusual bleeding, trouble breathing, redness and pain at the surgery site, drainage, fever, or pain not relieved by medication.    4) Additional Instructions:        Please contact your physician with any problems or Same Day Surgery at [phone], Monday through Friday 6 am to 4 pm, or [place] at [facility] number at [phone].  DISCHARGE INSTRUCTIONS FOR KIDNEY STONE/URETERAL STENT   MEDICATIONS:  1. Resume all your other meds from home.  2.  AZO (over-the-counter) can help with the burning/stinging when you urinate. 3.  Oxybutynin is for bladder/stent irritation, Rx was sent to your pharmacy.  ACTIVITY:  1. May resume regular activities in 24 hours. 2. No driving while on narcotic pain medications   3. Drink plenty of water  4. Continue to walk at home - you can still get blood clots when you are at home, so keep active, but don't over do it.  5. May return to work/school tomorrow or when you feel ready   BATHING:  1. You can shower. 2. You have a string coming from your urethra: The stent string is attached to your ureteral stent. Do not pull on this.   SIGNS/SYMPTOMS TO CALL:  Please call us if you have a fever greater than 101.5, uncontrolled nausea/vomiting, uncontrolled pain, dizziness, unable to urinate, bloody urine, chest pain, shortness of breath, leg swelling, leg pain, or any other  concerns or questions.   You can reach [place] at [phone].   FOLLOW-UP:  1. You we will be contacted regarding office follow-up 2. You have a string attached to your stent, you may remove it on Thursday AM 5/13. To do this, pull the string until the stent is completely removed. You may feel an odd sensation in your back.  If you would like our office staff to remove the stent then please contact the office at [phone]

## 2020-01-08 NOTE — Interval H&P Note (Signed)
History and Physical Interval Note:  01/08/2020 9:43 AM  Philip Mack  has presented today for surgery, with the diagnosis of left ureteral stone.  The various methods of treatment have been discussed with the patient and family. After consideration of risks, benefits and other options for treatment, the patient has consented to  Procedure(s): CYSTOSCOPY/URETEROSCOPY/HOLMIUM LASER/STENT PLACEMENT (Left) as a surgical intervention.  The patient's history has been reviewed, patient examined, no change in status, stable for surgery.  I have reviewed the patient's chart and labs.  Questions were answered to the patient's satisfaction.     [place]

## 2020-01-08 NOTE — Op Note (Signed)
Preoperative diagnosis: Left distal ureteral calculus  Postoperative diagnosis: Left distal ureteral calculus  Procedure:  1. Cystoscopy 2. Left ureteroscopy and stone removal 3. Ureteroscopic laser lithotripsy 4. Left ureteral stent placement (6FR) 26 cm 5. Left retrograde pyelography with interpretation  Surgeon: Nicki Reaper C. Shaheed Schmuck, M.D.  Anesthesia: General  Complications: None  Intraoperative findings:  1.  Left retrograde pyelography post procedure showed no filling defects, stone fragments or contrast extravasation  EBL: Minimal  Specimens: 1. Calculus fragments for analysis   Indication: Philip Mack is a 69 y.o. year old patient with a 7 mm left distal ureteral calculus with mild hydronephrosis.  He was asymptomatic and elected an initial trial of passage.  He subsequently underwent shockwave lithotripsy on 12/20/2019 with no significant stone fragmentation.  He presents today for ureteroscopic stone removal.  After reviewing the management options for treatment, the patient elected to proceed with the above surgical procedure(s). We have discussed the potential benefits and risks of the procedure, side effects of the proposed treatment, the likelihood of the patient achieving the goals of the procedure, and any potential problems that might occur during the procedure or recuperation. Informed consent has been obtained.  Description of procedure:  The patient was taken to the operating room and general anesthesia was induced.  The patient was placed in the dorsal lithotomy position, prepped and draped in the usual sterile fashion, and preoperative antibiotics were administered. A preoperative time-out was performed.   A 21 French cystoscope was lubricated and passed under direct vision.  The urethra was normal in caliber without stricture.  The prostate demonstrated mild lateral lobe enlargement and moderate bladder neck elevation with prostatic calculi.  Panendoscopy was  performed and the bladder mucosa showed no erythema, solid or papillary lesions.  Attention was directed to the left ureteral orifice and a 0.038 Sensor wire was then advanced up the ureter into the renal pelvis under fluoroscopic guidance.  A 4.5 Fr semirigid ureteroscope was then advanced into the ureter next to the guidewire and the calculus was identified.   The stone was then fragmented with a 200 micron holmium laser fiber on a setting of 0.8 J and frequency of 8 hz.   All stone fragments were then removed from the ureter with a zero tip nitinol basket.  Reinspection of the ureter revealed no remaining visible stones or fragments.   Retrograde pyelogram was performed with findings as described above.  A 6 FR/26 CM stent was placed under fluoroscopic guidance.  The wire was then removed with an adequate stent curl noted in the renal pelvis as well as in the bladder.  The patient appeared to tolerate the procedure well and without complications.  After anesthetic reversal the patient was transported to the PACU in stable condition.   Plan: The stent was left attached to a string and the patient was instructed to remove on Thursday, 01/10/2020   John Giovanni, MD

## 2020-01-08 NOTE — Transfer of Care (Signed)
Immediate Anesthesia Transfer of Care Note  Patient: Philip Mack  Procedure(s) Performed: CYSTOSCOPY/URETEROSCOPY/HOLMIUM LASER/STENT PLACEMENT (Left )  Patient Location: PACU  Anesthesia Type:General  Level of Consciousness: awake, alert  and oriented  Airway & Oxygen Therapy: Patient Spontanous Breathing and Patient connected to face mask oxygen  Post-op Assessment: Report given to RN and Post -op Vital signs reviewed and stable  Post vital signs: Reviewed and stable  Last Vitals:  Vitals Value Taken Time  BP 115/66 01/08/20 1103  Temp 36.1 C 01/08/20 1103  Pulse 70 01/08/20 1113  Resp 24 01/08/20 1113  SpO2 99 % 01/08/20 1113  Vitals shown include unvalidated device data.  Last Pain:  Vitals:   01/08/20 1103  TempSrc:   PainSc: 0-No pain         Complications: No apparent anesthesia complications

## 2020-01-08 NOTE — Anesthesia Postprocedure Evaluation (Signed)
Anesthesia Post Note  Patient: Philip Mack  Procedure(s) Performed: CYSTOSCOPY/URETEROSCOPY/HOLMIUM LASER/STENT PLACEMENT (Left )  Patient location during evaluation: PACU Anesthesia Type: General Level of consciousness: awake and alert and oriented Pain management: pain level controlled Vital Signs Assessment: post-procedure vital signs reviewed and stable Respiratory status: spontaneous breathing, nonlabored ventilation and respiratory function stable Cardiovascular status: blood pressure returned to baseline and stable Postop Assessment: no signs of nausea or vomiting Anesthetic complications: no     Last Vitals:  Vitals:   01/08/20 1118 01/08/20 1133  BP: 119/72 134/73  Pulse: 69 67  Resp: 15 14  Temp:  (!) 36.1 C  SpO2: 94% 96%    Last Pain:  Vitals:   01/08/20 1133  TempSrc:   PainSc: 0-No pain                 Bj Morlock

## 2020-01-08 NOTE — Anesthesia Procedure Notes (Signed)
Procedure Name: Intubation Performed by: Fredderick Phenix, CRNA Pre-anesthesia Checklist: Patient identified, Emergency Drugs available, Suction available and Patient being monitored Patient Re-evaluated:Patient Re-evaluated prior to induction Oxygen Delivery Method: Circle system utilized Preoxygenation: Pre-oxygenation with 100% oxygen Induction Type: IV induction Ventilation: Mask ventilation without difficulty LMA: LMA inserted LMA Size: 4.5 Tube type: Oral Number of attempts: 1 Airway Equipment and Method: Oral airway Placement Confirmation: ETT inserted through vocal cords under direct vision,  positive ETCO2 and breath sounds checked- equal and bilateral Tube secured with: Tape Dental Injury: Teeth and Oropharynx as per pre-operative assessment

## 2020-01-09 ENCOUNTER — Telehealth: Payer: Self-pay | Admitting: Urology

## 2020-01-09 LAB — CULTURE, URINE COMPREHENSIVE

## 2020-01-09 NOTE — Telephone Encounter (Signed)
App made and mailed °

## 2020-01-09 NOTE — Telephone Encounter (Signed)
-----   Message from Abbie Sons, MD sent at 01/08/2020 11:27 AM EDT ----- Regarding: Postop follow-up Please schedule postop follow-up 4-6 weeks.  Thanks

## 2020-01-09 NOTE — Telephone Encounter (Signed)
He should be getting this from the cardiologist Make sure he is not about to run out and have the pharmacist check with the cardiologist

## 2020-01-10 ENCOUNTER — Telehealth: Payer: Self-pay | Admitting: Urology

## 2020-01-10 NOTE — Telephone Encounter (Signed)
Pt. Would like for someone to call him and discuss his medications. He had surgery on 01/08/20 and not sure when to stop medications.

## 2020-01-10 NOTE — Telephone Encounter (Signed)
Spoke to patient and he states he is on Flomax and AZO. He wanted to know if he should stop them. He is having dark urine and wants to make sure it is just the AZO. I informed him that it is fine to stop the AZo but he needs to keep the taking the Flomax until his appointment with [facility]. Patient voiced understanding.

## 2020-01-11 ENCOUNTER — Encounter: Payer: Medicare Other | Admitting: *Deleted

## 2020-01-11 ENCOUNTER — Other Ambulatory Visit: Payer: Self-pay

## 2020-01-11 DIAGNOSIS — I214 Non-ST elevation (NSTEMI) myocardial infarction: Secondary | ICD-10-CM

## 2020-01-11 DIAGNOSIS — I252 Old myocardial infarction: Secondary | ICD-10-CM | POA: Diagnosis not present

## 2020-01-11 DIAGNOSIS — Z955 Presence of coronary angioplasty implant and graft: Secondary | ICD-10-CM | POA: Diagnosis not present

## 2020-01-11 NOTE — Progress Notes (Signed)
Daily Session Note  Patient Details  Name: Philip Mack MRN: [id number] Date of Birth: December 17, 1950 Referring Provider:     Cardiac Rehab from 11/12/2019 in [facility] Cardiac and Pulmonary Rehab  Referring Provider  [name] [facility] Cardiologist Dr. [name]      Encounter Date: 01/11/2020  Check In: Session Check In - 01/11/20 0904      Check-In   Supervising physician immediately available to respond to emergencies  See telemetry face sheet for immediately available ER MD    Location  [facility]-Cardiac & Pulmonary Rehab    Staff Present  Basilia Jumbo, RN, BSN;Joseph [address] [place], [place], [place], CCRP, CCET    Virtual Visit  No    Medication changes reported      Yes    Comments  Restarted Theora Gianotti    Fall or balance concerns reported     No    Warm-up and Cool-down  Performed on first and last piece of equipment    Resistance Training Performed  Yes    VAD Patient?  No    PAD/SET Patient?  No      Pain Assessment   Currently in Pain?  No/denies          Social History   Tobacco Use  Smoking Status Former Smoker  . Types: Cigarettes  . Quit date: 08/30/1980  . Years since quitting: 39.3  Smokeless Tobacco Never Used    Goals Met:  Independence with exercise equipment Exercise tolerated well No report of cardiac concerns or symptoms  Goals Unmet:  Not Applicable  Comments: Pt able to follow exercise prescription today without complaint.  Will continue to monitor for progression.    [name] is Medical Director for [place] and [facility].

## 2020-01-12 ENCOUNTER — Telehealth: Payer: Self-pay

## 2020-01-12 ENCOUNTER — Other Ambulatory Visit: Payer: Self-pay

## 2020-01-12 ENCOUNTER — Emergency Department: Payer: Medicare Other

## 2020-01-12 ENCOUNTER — Emergency Department
Admission: EM | Admit: 2020-01-12 | Discharge: 2020-01-12 | Disposition: A | Discharge status: Home / Self Care | Payer: Medicare Other | Attending: Emergency Medicine | Admitting: Emergency Medicine

## 2020-01-12 DIAGNOSIS — Z87891 Personal history of nicotine dependence: Secondary | ICD-10-CM | POA: Diagnosis not present

## 2020-01-12 DIAGNOSIS — I252 Old myocardial infarction: Secondary | ICD-10-CM | POA: Insufficient documentation

## 2020-01-12 DIAGNOSIS — Z955 Presence of coronary angioplasty implant and graft: Secondary | ICD-10-CM | POA: Insufficient documentation

## 2020-01-12 DIAGNOSIS — Z7982 Long term (current) use of aspirin: Secondary | ICD-10-CM | POA: Diagnosis not present

## 2020-01-12 DIAGNOSIS — Z79899 Other long term (current) drug therapy: Secondary | ICD-10-CM | POA: Insufficient documentation

## 2020-01-12 DIAGNOSIS — R319 Hematuria, unspecified: Secondary | ICD-10-CM | POA: Diagnosis not present

## 2020-01-12 DIAGNOSIS — R310 Gross hematuria: Secondary | ICD-10-CM | POA: Diagnosis not present

## 2020-01-12 LAB — URINALYSIS, COMPLETE (UACMP) WITH MICROSCOPIC
Bacteria, UA: NONE SEEN
RBC / HPF: >50 RBC/hpf — ABNORMAL HIGH (ref 0–5)
Specific Gravity, Urine: 1.01 (ref 1.005–1.030)
Squamous Epithelial / HPF: NONE SEEN (ref 0–5)
WBC, UA: NONE SEEN WBC/hpf (ref 0–5)

## 2020-01-12 LAB — BASIC METABOLIC PANEL
Anion gap: 7 (ref 5–15)
BUN: 14 mg/dL (ref 8–23)
CO2: 28 mmol/L (ref 22–32)
Calcium: 9.5 mg/dL (ref 8.9–10.3)
Chloride: 103 mmol/L (ref 98–111)
Creatinine, Ser: 0.96 mg/dL (ref 0.61–1.24)
GFR calc Af Amer: >60 mL/min (ref >60)
GFR calc non Af Amer: >60 mL/min (ref >60)
Glucose, Bld: 102 mg/dL — ABNORMAL HIGH (ref 70–99)
Potassium: 4.3 mmol/L (ref 3.5–5.1)
Sodium: 138 mmol/L (ref 135–145)

## 2020-01-12 LAB — CBC
HCT: 40.7 % (ref 39.0–52.0)
Hemoglobin: 14.2 g/dL (ref 13.0–17.0)
MCH: 30.9 pg (ref 26.0–34.0)
MCHC: 34.9 g/dL (ref 30.0–36.0)
MCV: 88.7 fL (ref 80.0–100.0)
Platelets: 175 10*3/uL (ref 150–400)
RBC: 4.59 MIL/uL (ref 4.22–5.81)
RDW: 13.4 % (ref 11.5–15.5)
WBC: 6.9 10*3/uL (ref 4.0–10.5)
nRBC: 0 % (ref 0.0–0.2)

## 2020-01-12 MED ORDER — SODIUM CHLORIDE 0.9 % IV BOLUS
1000.0000 mL | Freq: Once | INTRAVENOUS | Status: AC
  Administered 2020-01-12: 1000 mL via INTRAVENOUS

## 2020-01-12 NOTE — Telephone Encounter (Signed)
Spoke to pt after recent urological procedures. He said he is doing well. Some tenderness. He said he has Brilinta right now and will get with Dr Nehemiah Massed when he needs it.

## 2020-01-12 NOTE — ED Triage Notes (Signed)
Pt comes POV with hematuria after a kidney stone removal Tuesday morning. Endorses some burning but states that pain is improving. x2 urine with blood episodes today.

## 2020-01-12 NOTE — ED Provider Notes (Signed)
[place] EMERGENCY DEPARTMENT Provider Note   CSN: [id number] Arrival date & time: 01/12/20  1618     History Chief Complaint  Patient presents with  . Hematuria    Philip Mack is a 69 y.o. male history of kidney stones status post lithotripsy and ureteral stent placement 4 days ago here presenting with hematuria .  Patient states that he had the above procedure by Dr. Bernardo Heater on 5/11 and he was told to pull out his stent yesterday.  He also restarted on Brilinta yesterday.  He started having hematuria today.  He states that it is bright red with no clots.  He states that he is still urinating very well.  Denies any flank pain or fevers or trouble urinating or pain with urination.  Patient states that he had a NSTEMI back in January so that is why he is on Brilinta. Denies any chest pain or shortness of breath.   The history is provided by the patient.       Past Medical History:  Diagnosis Date  . Cataract   . Hepatitis    Hep A?  . History of kidney stones   . Hyperlipidemia   . Myocardial infarction [facility])    10/12/19    Patient Active Problem List   Diagnosis Date Noted  . Abnormal finding on imaging 11/14/2019  . Coronary atherosclerosis of native coronary artery 11/14/2019  . Ureteral stone 11/14/2019  . NSTEMI (non-ST elevated myocardial infarction) ([place]) 10/12/2019  . Right facial numbness 07/31/2019  . 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 Peyronie's disease 07/15/2017  . Advance directive discussed with patient 07/14/2016  . Right shoulder pain 07/08/2014  . Hyperlipidemia   . Preventative health care 01/26/2011    Past Surgical History:  Procedure Laterality Date  . Arm injury  1975  . COLONOSCOPY    . CORONARY STENT INTERVENTION N/A 10/15/2019   Procedure: CORONARY STENT INTERVENTION;  Surgeon: Yolonda Kida, MD;  Location: [facility];  Service: Cardiovascular;  Laterality: N/A;  .  CYSTOSCOPY/URETEROSCOPY/HOLMIUM LASER/STENT PLACEMENT Left 01/08/2020   Procedure: CYSTOSCOPY/URETEROSCOPY/HOLMIUM LASER/STENT PLACEMENT;  Surgeon: Abbie Sons, MD;  Location: [facility] ORS;  Service: Urology;  Laterality: Left;  . EXTRACORPOREAL SHOCK WAVE LITHOTRIPSY Left 12/20/2019   Procedure: EXTRACORPOREAL SHOCK WAVE LITHOTRIPSY (ESWL);  Surgeon: Hollice Espy, MD;  Location: [facility] ORS;  Service: Urology;  Laterality: Left;  . HERNIA REPAIR  1959   Bilateral, inguinal  . LEFT HEART CATH AND CORONARY ANGIOGRAPHY N/A 10/15/2019   Procedure: LEFT HEART CATH AND CORONARY ANGIOGRAPHY;  Surgeon: Corey Skains, MD;  Location: [place];  Service: Cardiovascular;  Laterality: N/A;  . POLYPECTOMY    . TONSILLECTOMY AND ADENOIDECTOMY  1961       Family History  Problem Relation Age of Onset  . Cancer Mother        Lung, (surgically resected)  . Heart disease Mother   . Hearing loss Other        On Mom's side  . Hypertension Neg Hx   . Diabetes Neg Hx   . Colon cancer Neg Hx   . Colon polyps Neg Hx   . Stomach cancer Neg Hx   . Rectal cancer Neg Hx     Social History   Tobacco Use  . Smoking status: Former Smoker    Types: Cigarettes    Quit date: 08/30/1980    Years since quitting: 39.3  . Smokeless tobacco: Never Used  Substance Use Topics  . Alcohol use: Yes    Comment: Occasionally  . Drug use: No    Home Medications Prior to Admission medications   Medication Sig Start Date End Date Taking? Authorizing Provider  aspirin 81 MG chewable tablet Chew 1 tablet (81 mg total) by mouth daily. 10/16/19   Wyvonnia Dusky, MD  atorvastatin (LIPITOR) 80 MG tablet Take 80 mg by mouth every evening.  11/09/19 11/08/20  [provider]  metoprolol tartrate (LOPRESSOR) 25 MG tablet Take 0.5 tablets (12.5 mg total) by mouth 2 (two) times daily. 10/16/19   Wyvonnia Dusky, MD  oxybutynin (DITROPAN) 5 MG tablet 1 tab tid prn frequency,urgency, bladder spasm  01/08/20   Stoioff, Ronda Fairly, MD  tamsulosin (FLOMAX) 0.4 MG CAPS capsule Take 1 capsule (0.4 mg total) by mouth daily. 12/03/19   Stoioff, Ronda Fairly, MD  ticagrelor (BRILINTA) 90 MG TABS tablet Take 90 mg by mouth 2 (two) times daily.    [provider]    Allergies    Patient has no known allergies.  Review of Systems   Review of Systems  Genitourinary: Positive for hematuria.  All other systems reviewed and are negative.   Physical Exam Updated Vital Signs BP 121/65   Pulse (!) 54   Temp 98.1 F (36.7 C) (Oral)   Resp 18   Ht 6\' 4"  (1.93 m)   Wt 95.3 kg   SpO2 97%   BMI 25.56 kg/m   Physical Exam Vitals and nursing note reviewed.  Constitutional:      Appearance: Normal appearance.  HENT:     Head: Normocephalic.     Nose: Nose normal.     Mouth/Throat:     Mouth: Mucous membranes are moist.  Eyes:     Extraocular Movements: Extraocular movements intact.     Pupils: Pupils are equal, round, and reactive to light.  Cardiovascular:     Rate and Rhythm: Normal rate and regular rhythm.     Pulses: Normal pulses.     Heart sounds: Normal heart sounds.  Pulmonary:     Effort: Pulmonary effort is normal.     Breath sounds: Normal breath sounds.  Abdominal:     General: Abdomen is flat.     Palpations: Abdomen is soft.     Comments: No suprapubic tenderness, no CVAT   Musculoskeletal:        General: Normal range of motion.     Cervical back: Normal range of motion.  Skin:    General: Skin is warm.     Capillary Refill: Capillary refill takes less than 2 seconds.  Neurological:     General: No focal deficit present.     Mental Status: He is alert and oriented to person, place, and time.  Psychiatric:        Mood and Affect: Mood normal.     ED Results / Procedures / Treatments   Labs (all labs ordered are listed, but only abnormal results are displayed) Labs Reviewed  BASIC METABOLIC PANEL - Abnormal; Notable for the following components:      Result  Value   Glucose, Bld 102 (*)    All other components within normal limits  CBC  URINALYSIS, COMPLETE (UACMP) WITH MICROSCOPIC    EKG None  Radiology No results found.  Procedures Procedures (including critical care time)  Medications Ordered in ED Medications  sodium chloride 0.9 % bolus 1,000 mL (1,000 mLs Intravenous New Bag/Given 01/12/20 1902)  ED Course  I have reviewed the triage vital signs and the nursing notes.  Pertinent labs & imaging results that were available during my care of the patient were reviewed by me and considered in my medical decision making (see chart for details).    MDM Rules/Calculators/A&P                      Khaliel Lenn is a 69 y.o. male here with hematuria.  He recently had lithotripsy and stent placement.  He likely had some retained stones that is causing his hematuria.  We will get a CBC, CMP, urinalysis.  We will get a renal ultrasound to assess for hydro and retained stones.  He is currently not in urinary retention.  He is on Brilinta and he told me that his cardiologist said that he may not need to continue. We will have him hold it given the bleeding and have him contact cardiology in the morning.  8:37 PM Patient's hemoglobin is stable, kidney function is normal.  Ultrasound showed no Hydro or shadowing stone.  Patient urinated in the ER and states that his hematuria is getting better.  I told him to stay hydrated.  He can hold his dose of Alimta today and just call his cardiologist to see when we can restart it.  Final Clinical Impression(s) / ED Diagnoses Final diagnoses:  None    Rx / DC Orders ED Discharge Orders    None       Drenda Freeze, MD 01/12/20 2040

## 2020-01-12 NOTE — ED Notes (Signed)
Pt on brilenta so blue top sent also

## 2020-01-12 NOTE — ED Notes (Signed)
US at bedside

## 2020-01-12 NOTE — Discharge Instructions (Signed)
Hold brilinta for now. Call your cardiologist on Monday to see when you need to be back on it   See your urologist   Return to ER if you have large clots in your urine, unable to urinate, flank pain, fever.

## 2020-01-12 NOTE — ED Notes (Signed)
Pt states he had procedure to remove kidney stone on Tuesday and had bright red blood in urine today. Pt denies any pain at this time. Pt reports he is on a blood thinner.

## 2020-01-14 ENCOUNTER — Other Ambulatory Visit: Payer: Self-pay

## 2020-01-14 ENCOUNTER — Encounter: Payer: Medicare Other | Admitting: *Deleted

## 2020-01-14 ENCOUNTER — Telehealth: Payer: Self-pay

## 2020-01-14 DIAGNOSIS — Z955 Presence of coronary angioplasty implant and graft: Secondary | ICD-10-CM | POA: Diagnosis not present

## 2020-01-14 DIAGNOSIS — I214 Non-ST elevation (NSTEMI) myocardial infarction: Secondary | ICD-10-CM

## 2020-01-14 DIAGNOSIS — I252 Old myocardial infarction: Secondary | ICD-10-CM | POA: Diagnosis not present

## 2020-01-14 LAB — URINE CULTURE: Culture: NO GROWTH

## 2020-01-14 NOTE — Progress Notes (Signed)
Daily Session Note  Patient Details  Name: Philip Mack MRN: [id number] Date of Birth: Oct 27, 1950 Referring Provider:     Cardiac Rehab from 11/12/2019 in [facility] Cardiac and Pulmonary Rehab  Referring Provider  [name] [facility] Cardiologist Dr. [name]      Encounter Date: 01/14/2020  Check In: Session Check In - 01/14/20 0822      Check-In   Supervising physician immediately available to respond to emergencies  See telemetry face sheet for immediately available ER MD    Location  [facility]-Cardiac & Pulmonary Rehab    Staff Present  [name], RN, BSN, Laveda Norman, BS, ACSM CEP, Exercise Physiologist;Joseph Tessie Fass RCP,RRT,BSRT    Virtual Visit  No    Medication changes reported      No    Fall or balance concerns reported     No    Warm-up and Cool-down  Performed on first and last piece of equipment    Resistance Training Performed  Yes    VAD Patient?  No    PAD/SET Patient?  No      Pain Assessment   Currently in Pain?  No/denies          Social History   Tobacco Use  Smoking Status Former Smoker  . Types: Cigarettes  . Quit date: 08/30/1980  . Years since quitting: 39.4  Smokeless Tobacco Never Used    Goals Met:  Independence with exercise equipment Exercise tolerated well No report of cardiac concerns or symptoms  Goals Unmet:  Not Applicable  Comments: Pt able to follow exercise prescription today without complaint.  Will continue to monitor for progression.    [name] is Medical Director for [place] and [facility].

## 2020-01-14 NOTE — Telephone Encounter (Signed)
Incoming call from pt who states that he had surgery last Tuesday and did well up until Saturday when he has several episodes of gross hematuria. The patient went to the ED and was cleared however he wanted to call us and make [place] aware to ensure he does not need to be seen sooner. Advised pt that he does not need to be seen sooner unless he develops a fever, nausea, vomiting, chills or another episode of gross hematuria. Pt gave verbal understanding. Patient denies any symptoms at this time, he states that urine is clear and he feels well.

## 2020-01-15 LAB — CALCULI, WITH PHOTOGRAPH (CLINICAL LAB)
Calcium Oxalate Monohydrate: 100 %
Weight Calculi: 46 mg

## 2020-01-16 ENCOUNTER — Encounter: Payer: Self-pay | Admitting: *Deleted

## 2020-01-16 ENCOUNTER — Other Ambulatory Visit: Payer: Self-pay

## 2020-01-16 ENCOUNTER — Encounter: Payer: Medicare Other | Admitting: *Deleted

## 2020-01-16 DIAGNOSIS — Z955 Presence of coronary angioplasty implant and graft: Secondary | ICD-10-CM | POA: Diagnosis not present

## 2020-01-16 DIAGNOSIS — I214 Non-ST elevation (NSTEMI) myocardial infarction: Secondary | ICD-10-CM

## 2020-01-16 DIAGNOSIS — I252 Old myocardial infarction: Secondary | ICD-10-CM | POA: Diagnosis not present

## 2020-01-16 NOTE — Progress Notes (Signed)
Daily Session Note  Patient Details  Name: Philip Mack MRN: [id number] Date of Birth: 06-15-1951 Referring Provider:     Cardiac Rehab from 11/12/2019 in [facility] Cardiac and Pulmonary Rehab  Referring Provider  [name] [facility] Cardiologist Dr. [name]      Encounter Date: 01/16/2020  Check In: Session Check In - 01/16/20 0824      Check-In   Supervising physician immediately available to respond to emergencies  See telemetry face sheet for immediately available ER MD    Location  [facility]-Cardiac & Pulmonary Rehab    Staff Present  [name], RN, BSN, Lance Sell, BA, ACSM CEP, Exercise Physiologist;Joseph Tessie Fass RCP,RRT,BSRT    Virtual Visit  No    Medication changes reported      No    Fall or balance concerns reported     No    Warm-up and Cool-down  Performed on first and last piece of equipment    Resistance Training Performed  Yes    VAD Patient?  No    PAD/SET Patient?  No      Pain Assessment   Currently in Pain?  No/denies          Social History   Tobacco Use  Smoking Status Former Smoker  . Types: Cigarettes  . Quit date: 08/30/1980  . Years since quitting: 39.4  Smokeless Tobacco Never Used    Goals Met:  Independence with exercise equipment Exercise tolerated well No report of cardiac concerns or symptoms  Goals Unmet:  Not Applicable  Comments: Pt able to follow exercise prescription today without complaint.  Will continue to monitor for progression.    [name] is Medical Director for [place] and [facility].

## 2020-01-16 NOTE — Progress Notes (Signed)
Cardiac Individual Treatment Plan  Patient Details  Name: Philip Mack MRN: [id number] Date of Birth: 1951/04/10 Referring Provider:     Cardiac Rehab from 11/12/2019 in [facility] Cardiac and Pulmonary Rehab  Referring Provider  [name] [facility] Cardiologist Dr. [name]      Initial Encounter Date:    Cardiac Rehab from 11/12/2019 in [facility] Cardiac and Pulmonary Rehab  Date  11/12/19      Visit Diagnosis: NSTEMI (non-ST elevated myocardial infarction) [facility])  Status post coronary artery stent placement  Patient's Home Medications on Admission:  Current Outpatient Medications:  .  aspirin 81 MG chewable tablet, Chew 1 tablet (81 mg total) by mouth daily., Disp:  , Rfl:  .  atorvastatin (LIPITOR) 80 MG tablet, Take 80 mg by mouth every evening. , Disp: , Rfl:  .  metoprolol tartrate (LOPRESSOR) 25 MG tablet, Take 0.5 tablets (12.5 mg total) by mouth 2 (two) times daily., Disp: 30 tablet, Rfl: 0 .  oxybutynin (DITROPAN) 5 MG tablet, 1 tab tid prn frequency,urgency, bladder spasm, Disp: 15 tablet, Rfl: 0 .  tamsulosin (FLOMAX) 0.4 MG CAPS capsule, Take 1 capsule (0.4 mg total) by mouth daily., Disp: 30 capsule, Rfl: 1 .  ticagrelor (BRILINTA) 90 MG TABS tablet, Take 90 mg by mouth 2 (two) times daily., Disp: , Rfl:   Past Medical History: Past Medical History:  Diagnosis Date  . Cataract   . Hepatitis    Hep A?  . History of kidney stones   . Hyperlipidemia   . Myocardial infarction ([place])    10/12/19    Tobacco Use: Social History   Tobacco Use  Smoking Status Former Smoker  . Types: Cigarettes  . Quit date: 08/30/1980  . Years since quitting: 39.4  Smokeless Tobacco Never Used    Labs: Recent Chemical engineer    Labs for ITP Cardiac and Pulmonary Rehab Latest Ref Rng & Units 07/14/2015 07/14/2016 07/19/2018 07/31/2019 10/12/2019   Cholestrol 0 - 200 mg/dL 232(H) 213(H) 218(H) 233(H) 231(H)   LDLCALC 0 - 99 mg/dL 151(H) 130(H) 144(H) 157(H) 156(H)    LDLDIRECT mg/dL - - - - -   HDL >40 mg/dL 61.30 67.90 61.60 62.50 67   Trlycerides <150 mg/dL 98.0 76.0 64.0 70.0 41       Exercise Target Goals: Exercise Program Goal: Individual exercise prescription set using results from initial 6 min walk test and THRR while considering  patient's activity barriers and safety.   Exercise Prescription Goal: Initial exercise prescription builds to 30-45 minutes a day of aerobic activity, 2-3 days per week.  Home exercise guidelines will be given to patient during program as part of exercise prescription that the participant will acknowledge.   Education: Aerobic Exercise & Resistance Training: - Gives group verbal and written instruction on the various components of exercise. Focuses on aerobic and resistive training programs and the benefits of this training and how to safely progress through these programs..   Education: Exercise & Equipment Safety: - Individual verbal instruction and demonstration of equipment use and safety with use of the equipment.   Cardiac Rehab from 11/12/2019 in [facility] Cardiac and Pulmonary Rehab  Date  11/12/19  Educator  [facility]  Instruction Review Code  1- Verbalizes Understanding      Education: Exercise Physiology & General Exercise Guidelines: - Group verbal and written instruction with models to review the exercise physiology of the cardiovascular system and associated critical values. Provides general exercise guidelines with specific guidelines to those with  heart or lung disease.    Education: Flexibility, Balance, Mind/Body Relaxation: Provides group verbal/written instruction on the benefits of flexibility and balance training, including mind/body exercise modes such as yoga, pilates and tai chi.  Demonstration and skill practice provided.   Activity Barriers & Risk Stratification: Activity Barriers & Cardiac Risk Stratification - 11/12/19 1035      Activity Barriers & Cardiac Risk Stratification   Activity  Barriers  Other (comment);Joint Problems;Balance Concerns;Deconditioning;Muscular Weakness    Comments  current kidney stone pain; shoulder pain    Cardiac Risk Stratification  Moderate       6 Minute Walk: 6 Minute Walk    Row Name 11/12/19 1032         6 Minute Walk   Phase  Initial     Distance  1540 feet     Walk Time  6 minutes     # of Rest Breaks  0     MPH  2.92     METS  3.65     RPE  11     VO2 Peak  12.77     Symptoms  No     Resting HR  67 bpm     Resting BP  122/64     Resting Oxygen Saturation   98 %     Exercise Oxygen Saturation  during 6 min walk  95 %     Max Ex. HR  84 bpm     Max Ex. BP  146/74     2 Minute Post BP  104/66        Oxygen Initial Assessment:   Oxygen Re-Evaluation:   Oxygen Discharge (Final Oxygen Re-Evaluation):   Initial Exercise Prescription: Initial Exercise Prescription - 11/12/19 1000      Date of Initial Exercise RX and Referring Provider   Date  11/12/19    Referring Provider  [name]   Primary Cardiologist Dr. [name]     Treadmill   MPH  2.9    Grade  1    Minutes  15    METs  3.6      Elliptical   Level  2    Speed  3.5    Minutes  15    METs  3      REL-XR   Level  3    Speed  50    Minutes  15    METs  3      Prescription Details   Frequency (times per week)  3    Duration  Progress to 30 minutes of continuous aerobic without signs/symptoms of physical distress      Intensity   THRR 40-80% of Max Heartrate  101-135    Ratings of Perceived Exertion  11-13    Perceived Dyspnea  0-4      Progression   Progression  Continue to progress workloads to maintain intensity without signs/symptoms of physical distress.      Resistance Training   Training Prescription  Yes    Weight  3 lb    Reps  10-15       Perform Capillary Blood Glucose checks as needed.  Exercise Prescription Changes: Exercise Prescription Changes    Row Name 11/12/19 1000 11/27/19 0700 12/13/19 1200  12/25/19 1500 01/07/20 1600     Response to Exercise   Blood Pressure (Admit)  122/64  138/77  104/58  118/80  142/72   Blood Pressure (Exercise)  146/74  160/72  144/58  138/62  130/60   Blood Pressure (Exit)  104/44  120/84  96/54  94/60  98/68   Heart Rate (Admit)  67 bpm  78 bpm  68 bpm  73 bpm  65 bpm   Heart Rate (Exercise)  84 bpm  119 bpm  113 bpm  109 bpm  102 bpm   Heart Rate (Exit)  63 bpm  79 bpm  68 bpm  70 bpm  72 bpm   Oxygen Saturation (Admit)  98 %  --  --  --  --   Oxygen Saturation (Exercise)  95 %  --  --  --  --   Rating of Perceived Exertion (Exercise)  '11  13  14  14  15   ' Symptoms  none  none  none  none  none   Comments  walk test results  --  --  --  --   Duration  --  Continue with 30 min of aerobic exercise without signs/symptoms of physical distress.  Continue with 30 min of aerobic exercise without signs/symptoms of physical distress.  Continue with 30 min of aerobic exercise without signs/symptoms of physical distress.  Continue with 30 min of aerobic exercise without signs/symptoms of physical distress.   Intensity  --  THRR unchanged  THRR unchanged  THRR unchanged  THRR unchanged     Progression   Progression  --  Continue to progress workloads to maintain intensity without signs/symptoms of physical distress.  Continue to progress workloads to maintain intensity without signs/symptoms of physical distress.  Continue to progress workloads to maintain intensity without signs/symptoms of physical distress.  Continue to progress workloads to maintain intensity without signs/symptoms of physical distress.   Average METs  --  4.37  4.65  4.87  5.3     Resistance Training   Training Prescription  --  Yes  Yes  Yes  Yes   Weight  --  3 lb  5 lb  5 lb  5 lb   Reps  --  10-15  10-15  10-15  10-15     Interval Training   Interval Training  --  No  No  No  Yes   Equipment  --  --  --  --  Treadmill     Treadmill   MPH  --  3.5  3.5  3.5  3.5   Grade  --  '2  2  2   2 ' 2-10%   Minutes  --  '15  15  15  15   ' METs  --  4.64  4.65  4.64  4.65     Elliptical   Level  --  '3  5  6  ' --   Speed  --  '3  3  3  ' --   Minutes  --  '15  15  15  ' --   METs  --  4.1  --  5.1  --     REL-XR   Level  --  --  --  --  4   Speed  --  --  --  --  50   Minutes  --  --  --  --  15   METs  --  --  --  --  6     Home Exercise Plan   Plans to continue exercise at  --  --  --  Home (comment) walking, [employer], community gym  --   Frequency  --  --  --  Add 4 additional days to program exercise sessions.  --   Initial Home Exercises Provided  --  --  --  12/10/19  --      Exercise Comments: Exercise Comments    Row Name 11/14/19 0900           Exercise Comments  First full day of exercise!  Patient was oriented to gym and equipment including functions, settings, policies, and procedures.  Patient's individual exercise prescription and treatment plan were reviewed.  All starting workloads were established based on the results of the 6 minute walk test done at initial orientation visit.  The plan for exercise progression was also introduced and progression will be customized based on patient's performance and goals.          Exercise Goals and Review: Exercise Goals    Row Name 11/12/19 1038             Exercise Goals   Increase Physical Activity  Yes       Intervention  Provide advice, education, support and counseling about physical activity/exercise needs.;Develop an individualized exercise prescription for aerobic and resistive training based on initial evaluation findings, risk stratification, comorbidities and participant's personal goals.       Expected Outcomes  Short Term: Attend rehab on a regular basis to increase amount of physical activity.;Long Term: Add in home exercise to make exercise part of routine and to increase amount of physical activity.;Long Term: Exercising regularly at least 3-5 days a week.       Increase Strength and Stamina  Yes        Intervention  Provide advice, education, support and counseling about physical activity/exercise needs.;Develop an individualized exercise prescription for aerobic and resistive training based on initial evaluation findings, risk stratification, comorbidities and participant's personal goals.       Expected Outcomes  Short Term: Increase workloads from initial exercise prescription for resistance, speed, and METs.;Short Term: Perform resistance training exercises routinely during rehab and add in resistance training at home;Long Term: Improve cardiorespiratory fitness, muscular endurance and strength as measured by increased METs and functional capacity (6MWT)       Able to understand and use rate of perceived exertion (RPE) scale  Yes       Intervention  Provide education and explanation on how to use RPE scale       Expected Outcomes  Short Term: Able to use RPE daily in rehab to express subjective intensity level;Long Term:  Able to use RPE to guide intensity level when exercising independently       Able to understand and use Dyspnea scale  Yes       Intervention  Provide education and explanation on how to use Dyspnea scale       Expected Outcomes  Short Term: Able to use Dyspnea scale daily in rehab to express subjective sense of shortness of breath during exertion;Long Term: Able to use Dyspnea scale to guide intensity level when exercising independently       Knowledge and understanding of Target Heart Rate Range (THRR)  Yes       Intervention  Provide education and explanation of THRR including how the numbers were predicted and where they are located for reference       Expected Outcomes  Short Term: Able to state/look up THRR;Short Term: Able to use daily as guideline for intensity in rehab;Long Term: Able to use THRR to govern intensity when exercising independently       Able  to check pulse independently  Yes       Intervention  Provide education and demonstration on how to check pulse in  carotid and radial arteries.;Review the importance of being able to check your own pulse for safety during independent exercise       Expected Outcomes  Short Term: Able to explain why pulse checking is important during independent exercise;Long Term: Able to check pulse independently and accurately       Understanding of Exercise Prescription  Yes       Intervention  Provide education, explanation, and written materials on patient's individual exercise prescription       Expected Outcomes  Short Term: Able to explain program exercise prescription;Long Term: Able to explain home exercise prescription to exercise independently          Exercise Goals Re-Evaluation : Exercise Goals Re-Evaluation    Row Name 11/14/19 0900 11/27/19 0723 12/10/19 0820 12/25/19 1543 01/07/20 1603     Exercise Goal Re-Evaluation   Exercise Goals Review  Able to understand and use rate of perceived exertion (RPE) scale;Knowledge and understanding of Target Heart Rate Range (THRR);Understanding of Exercise Prescription  Increase Physical Activity;Increase Strength and Stamina;Understanding of Exercise Prescription  Increase Physical Activity;Able to understand and use rate of perceived exertion (RPE) scale;Increase Strength and Stamina;Able to understand and use Dyspnea scale;Knowledge and understanding of Target Heart Rate Range (THRR);Able to check pulse independently;Understanding of Exercise Prescription  Increase Physical Activity;Increase Strength and Stamina;Understanding of Exercise Prescription  Increase Physical Activity;Increase Strength and Stamina;Able to understand and use rate of perceived exertion (RPE) scale;Able to understand and use Dyspnea scale;Knowledge and understanding of Target Heart Rate Range (THRR);Able to check pulse independently   Comments  Reviewed RPE scale, THR and program prescription with pt today.  Pt voiced understanding and was given a copy of goals to take home.  Philip Mack is doing well in  rehab.  He has really taken to the elliptical and having fun with exercise.  He is already on level 3 with it.  We will continue to monitor his progress.  Reviewed home exercise with pt today.  Pt plans to continue to go to [employer] (elliptical, weights, jump rope T/Th) and fitness room in neighborhood for exercise.  He has started to try to run again on the weekend mornings.  Reviewed THR, pulse, RPE, sign and symptoms, NTG use, and when to call 911 or MD.  Also discussed weather considerations and indoor options.  Pt voiced understanding.  He is dong well overall.  Philip Mack is doing well in rehab.  He is enjoying the challenge of the elliptical and is up to 5.1 METs on there.  We will continue to monitor his progress.  Philip Mack attends consistently and works at [employer].  He does incline between 2-10% on TM.   Expected Outcomes  Short: Use RPE daily to regulate intensity. Long: Follow program prescription in THR.  Short: Continue to improve on ellipitcal.  Long: Continue to improve stamina.  Short: Continue to exericse daily  Long: Continue to improve stamina.  Short: Talk about adding in intervals consistently (tried one day)  Long: Continue to improve stamina.  --   Philip Mack Name 01/07/20 1613             Exercise Goal Re-Evaluation   Expected Outcomes  Short: continue intervals Long: continue to improve stamina          Discharge Exercise Prescription (Final Exercise Prescription Changes): Exercise Prescription Changes - 01/07/20 1600  Response to Exercise   Blood Pressure (Admit)  142/72    Blood Pressure (Exercise)  130/60    Blood Pressure (Exit)  98/68    Heart Rate (Admit)  65 bpm    Heart Rate (Exercise)  102 bpm    Heart Rate (Exit)  72 bpm    Rating of Perceived Exertion (Exercise)  15    Symptoms  none    Duration  Continue with 30 min of aerobic exercise without signs/symptoms of physical distress.    Intensity  THRR unchanged      Progression   Progression  Continue to progress  workloads to maintain intensity without signs/symptoms of physical distress.    Average METs  5.3      Resistance Training   Training Prescription  Yes    Weight  5 lb    Reps  10-15      Interval Training   Interval Training  Yes    Equipment  Treadmill      Treadmill   MPH  3.5    Grade  2   2-10%   Minutes  15    METs  4.65      REL-XR   Level  4    Speed  50    Minutes  15    METs  6       Nutrition:  Target Goals: Understanding of nutrition guidelines, daily intake of sodium <1533m, cholesterol <2066m calories 30% from fat and 7% or less from saturated fats, daily to have 5 or more servings of fruits and vegetables.  Education: Controlling Sodium/Reading Food Labels -Group verbal and written material supporting the discussion of sodium use in heart healthy nutrition. Review and explanation with models, verbal and written materials for utilization of the food label.   Education: General Nutrition Guidelines/Fats and Fiber: -Group instruction provided by verbal, written material, models and posters to present the general guidelines for heart healthy nutrition. Gives an explanation and review of dietary fats and fiber.   Biometrics: Pre Biometrics - 11/12/19 1038      Pre Biometrics   Height  6' 4.4" (1.941 m)    Weight  211 lb 9.6 oz (96 kg)    BMI (Calculated)  25.48    Single Leg Stand  4.5 seconds        Nutrition Therapy Plan and Nutrition Goals: Nutrition Therapy & Goals - 12/18/19 1001      Nutrition Therapy   Diet  Low Na, HH    Drug/Food Interactions  Statins/Certain Fruits    Protein (specify units)  80-85g    Fiber  30 grams    Whole Grain Foods  3 servings    Saturated Fats  12 max. grams    Fruits and Vegetables  5 servings/day    Sodium  1.5 grams      Personal Nutrition Goals   Nutrition Goal  ST: continue with current changes, read labels to make sure no trans fat/ low Na, include variety LT: get back to normal activity (usually)     Comments  B: couple servings of fruit (apples, peaches, pears, berries, mangto, banana), oatmeal and cereal (looking for fiber or protein), muffins (baked homemade, instead of oil will use appple sauce). L: 4-5x/week his wife will make something: not cooking with much salt or fat S: sweet or salty. D: light fare; protein bar with some fruit, deli meat with some cheese, usually fruit included. ice cream on sunday afternoon. Discussed HH eating.  Intervention Plan   Intervention  Prescribe, educate and counsel regarding individualized specific dietary modifications aiming towards targeted core components such as weight, hypertension, lipid management, diabetes, heart failure and other comorbidities.;Nutrition handout(s) given to patient.    Expected Outcomes  Short Term Goal: Understand basic principles of dietary content, such as calories, fat, sodium, cholesterol and nutrients.;Short Term Goal: A plan has been developed with personal nutrition goals set during dietitian appointment.;Long Term Goal: Adherence to prescribed nutrition plan.       Nutrition Assessments: Nutrition Assessments - 11/12/19 1041      MEDFICTS Scores   Pre Score  42       MEDIFICTS Score Key:          ?70 Need to make dietary changes          40-70 Heart Healthy Diet         ? 40 Therapeutic Level Cholesterol Diet  Nutrition Goals Re-Evaluation: Nutrition Goals Re-Evaluation    [place] Name 01/14/20 0824             Goals   Nutrition Goal  ST: continue with current changes, read labels to make sure no trans fat/ low Na, include variety LT: get back to normal activity (usually)       Comment  Pt would like to continue with current changes       Expected Outcome  ST: continue with current changes, read labels to make sure no trans fat/ low Na, include variety LT: get back to normal activity (usually)          Nutrition Goals Discharge (Final Nutrition Goals Re-Evaluation): Nutrition Goals Re-Evaluation -  01/14/20 0824      Goals   Nutrition Goal  ST: continue with current changes, read labels to make sure no trans fat/ low Na, include variety LT: get back to normal activity (usually)    Comment  Pt would like to continue with current changes    Expected Outcome  ST: continue with current changes, read labels to make sure no trans fat/ low Na, include variety LT: get back to normal activity (usually)       Psychosocial: Target Goals: Acknowledge presence or absence of significant depression and/or stress, maximize coping skills, provide positive support system. Participant is able to verbalize types and ability to use techniques and skills needed for reducing stress and depression.   Education: Depression - Provides group verbal and written instruction on the correlation between heart/lung disease and depressed mood, treatment options, and the stigmas associated with seeking treatment.   Education: Sleep Hygiene -Provides group verbal and written instruction about how sleep can affect your health.  Define sleep hygiene, discuss sleep cycles and impact of sleep habits. Review good sleep hygiene tips.     Education: Stress and Anxiety: - Provides group verbal and written instruction about the health risks of elevated stress and causes of high stress.  Discuss the correlation between heart/lung disease and anxiety and treatment options. Review healthy ways to manage with stress and anxiety.    Initial Review & Psychosocial Screening: Initial Psych Review & Screening - 11/09/19 1011      Initial Review   Current issues with  Current Stress Concerns    Comments  current kidney stone, president of church council, sons out state      [place]?  Yes   wife     Barriers   Psychosocial barriers to participate in program  There are  no identifiable barriers or psychosocial needs.;The patient should benefit from training in stress management and relaxation.       Screening Interventions   Interventions  Encouraged to exercise;To provide support and resources with identified psychosocial needs    Expected Outcomes  Short Term goal: Utilizing psychosocial counselor, staff and physician to assist with identification of specific Stressors or current issues interfering with healing process. Setting desired goal for each stressor or current issue identified.;Long Term Goal: Stressors or current issues are controlled or eliminated.;Short Term goal: Identification and review with participant of any Quality of Life or Depression concerns found by scoring the questionnaire.;Long Term goal: The participant improves quality of Life and PHQ9 Scores as seen by post scores and/or verbalization of changes       Quality of Life Scores:  Quality of Life - 11/12/19 1041      Quality of Life   Select  Quality of Life      Quality of Life Scores   Health/Function Pre  23 %    Socioeconomic Pre  24.67 %    Psych/Spiritual Pre  22.86 %    Family Pre  27.6 %    GLOBAL Pre  23.97 %      Scores of 19 and below usually indicate a poorer quality of life in these areas.  A difference of  2-3 points is a clinically meaningful difference.  A difference of 2-3 points in the total score of the Quality of Life Index has been associated with significant improvement in overall quality of life, self-image, physical symptoms, and general health in studies assessing change in quality of life.  PHQ-9: Recent Review Flowsheet Data    Depression screen [facility] 2/9 11/12/2019 10/19/2019 07/31/2019 07/19/2018 07/15/2017   Decreased Interest 1 0 0 0 0   Down, Depressed, Hopeless 0 0 0 0 0   PHQ - 2 Score 1 0 0 0 0   Altered sleeping 0 - - - -   Tired, decreased energy 1 - - - -   Change in appetite 0 - - - -   Feeling bad or failure about yourself  0 - - - -   Trouble concentrating 0 - - - -   Moving slowly or fidgety/restless 0 - - - -   Suicidal thoughts 0 - - - -   PHQ-9 Score 2 - - - -    Difficult doing work/chores Not difficult at all - - - -     Interpretation of Total Score  Total Score Depression Severity:  1-4 = Minimal depression, 5-9 = Mild depression, 10-14 = Moderate depression, 15-19 = Moderately severe depression, 20-27 = Severe depression   Psychosocial Evaluation and Intervention: Psychosocial Evaluation - 11/09/19 1017      Psychosocial Evaluation & Interventions   Comments  Philip Mack is a very active person. He is wanting to get back [facility] [employer]. His wife is very supportive. He reports sleeping well, although he is currently dealing with a kidney stone that he has not passed for a few weeks. Due to his blood thinner post MI, he can't have surgery to remove it. 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    Expected Outcomes  Short: attend [facility] for exercise and education. Long: maintain positive self care habits    Continue Psychosocial Services   Follow up required by staff  Psychosocial Re-Evaluation: Psychosocial Re-Evaluation    Row Name 12/10/19 1610 12/26/19 0839           Psychosocial Re-Evaluation   Current issues with  Current Stress Concerns  --      Comments  Philip Mack is doing well mentally.  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 His wife also had an emergency appendectomy 2 weeks ago.  All of this has limited their ability to travel and they hope to get back to it in May.  Overall, he is good.  He sleeps very well.  Philip Mack continues to do well mentally.  He sleeps well.  They do have trips planned in May.      Expected Outcomes  Short: Make it through May.  Long: Continue to get in exercise for stress relief.  Short:  continue to exercise Long:  maintain positive outlook      Interventions  Encouraged to attend Cardiac Rehabilitation for the exercise;Stress  management education  --         Psychosocial Discharge (Final Psychosocial Re-Evaluation): Psychosocial Re-Evaluation - 12/26/19 0839      Psychosocial Re-Evaluation   Comments  Philip Mack continues to do well mentally.  He sleeps well.  They do have trips planned in May.    Expected Outcomes  Short:  continue to exercise Long:  maintain positive outlook       Vocational Rehabilitation: Provide vocational rehab assistance to qualifying candidates.   Vocational Rehab Evaluation & Intervention: Vocational Rehab - 11/09/19 1011      Initial Vocational Rehab Evaluation & Intervention   Assessment shows need for Vocational Rehabilitation  No       Education: Education Goals: Education classes will be provided on a variety of topics geared toward better understanding of heart health and risk factor modification. Participant will state understanding/return demonstration of topics presented as noted by education test scores.  Learning Barriers/Preferences: Learning Barriers/Preferences - 11/09/19 1011      Learning Barriers/Preferences   Learning Barriers  None    Learning Preferences  None       General Cardiac Education Topics:  AED/CPR: - Group verbal and written instruction with the use of models to demonstrate the basic use of the AED with the basic ABC's of resuscitation.   Anatomy & Physiology of the Heart: - Group verbal and written instruction and models provide basic cardiac anatomy and physiology, with the coronary electrical and arterial systems. Review of Valvular disease and Heart Failure   Cardiac Procedures: - Group verbal and written instruction to review commonly prescribed medications for heart disease. Reviews the medication, class of the drug, and side effects. Includes the steps to properly store meds and maintain the prescription regimen. (beta blockers and nitrates)   Cardiac Medications I: - Group verbal and written instruction to review commonly  prescribed medications for heart disease. Reviews the medication, class of the drug, and side effects. Includes the steps to properly store meds and maintain the prescription regimen.   Cardiac Medications II: -Group verbal and written instruction to review commonly prescribed medications for heart disease. Reviews the medication, class of the drug, and side effects. (all other drug classes)    Go Sex-Intimacy & Heart Disease, Get SMART - Goal Setting: - Group verbal and written instruction through game format to discuss heart disease and the return to sexual intimacy. Provides group verbal and written material to discuss and apply goal setting  through the application of the S.M.A.R.T. Method.   Other Matters of the Heart: - Provides group verbal, written materials and models to describe Stable Angina and Peripheral Artery. Includes description of the disease process and treatment options available to the cardiac patient.   Infection Prevention: - Provides verbal and written material to individual with discussion of infection control including proper hand washing and proper equipment cleaning during exercise session.   Cardiac Rehab from 11/12/2019 in [facility] Cardiac and Pulmonary Rehab  Date  11/12/19  Educator  [facility]  Instruction Review Code  1- Verbalizes Understanding      Falls Prevention: - Provides verbal and written material to individual with discussion of falls prevention and safety.   Cardiac Rehab from 11/12/2019 in [facility] Cardiac and Pulmonary Rehab  Date  11/12/19  Educator  [facility]  Instruction Review Code  1- Verbalizes Understanding      Other: -Provides group and verbal instruction on various topics (see comments)   Knowledge Questionnaire Score: Knowledge Questionnaire Score - 11/12/19 1041      Knowledge Questionnaire Score   Pre Score  25/26 only missed pulse question       Core Components/Risk Factors/Patient Goals at Admission: Personal Goals and Risk Factors at  Admission - 11/12/19 1042      Core Components/Risk Factors/Patient Goals on Admission    Weight Management  Yes;Weight Loss    Intervention  Weight Management: Develop a combined nutrition and exercise program designed to reach desired caloric intake, while maintaining appropriate intake of nutrient and fiber, sodium and fats, and appropriate energy expenditure required for the weight goal.;Weight Management: Provide education and appropriate resources to help participant work on and attain dietary goals.    Admit Weight  211 lb 9.6 oz (96 kg)    Goal Weight: Short Term  206 lb (93.4 kg)    Goal Weight: Long Term  200 lb (90.7 kg)    Expected Outcomes  Long Term: Adherence to nutrition and physical activity/exercise program aimed toward attainment of established weight goal;Short Term: Continue to assess and modify interventions until short term weight is achieved;Weight Loss: Understanding of general recommendations for a balanced deficit meal plan, which promotes 1-2 lb weight loss per week and includes a negative energy balance of [phone] kcal/d;Understanding of distribution of calorie intake throughout the day with the consumption of 4-5 meals/snacks;Understanding recommendations for meals to include 15-35% energy as protein, 25-35% energy from fat, 35-60% energy from carbohydrates, less than 263m of dietary cholesterol, 20-35 gm of total fiber daily    Hypertension  Yes    Intervention  Provide education on lifestyle modifcations including regular physical activity/exercise, weight management, moderate sodium restriction and increased consumption of fresh fruit, vegetables, and low fat dairy, alcohol moderation, and smoking cessation.;Monitor prescription use compliance.    Expected Outcomes  Long Term: Maintenance of blood pressure at goal levels.;Short Term: Continued assessment and intervention until BP is < 140/910mHG in hypertensive participants. < 130/8026mG in hypertensive participants  with diabetes, heart failure or chronic kidney disease.    Lipids  Yes    Intervention  Provide education and support for participant on nutrition & aerobic/resistive exercise along with prescribed medications to achieve LDL <66m28mDL >40mg35m Expected Outcomes  Short Term: Participant states understanding of desired cholesterol values and is compliant with medications prescribed. Participant is following exercise prescription and nutrition guidelines.;Long Term: Cholesterol controlled with medications as prescribed, with individualized exercise RX and with personalized nutrition plan. Value goals:  LDL < 49m, HDL > 40 mg.       Education:Diabetes - Individual verbal and written instruction to review signs/symptoms of diabetes, desired ranges of glucose level fasting, after meals and with exercise. Acknowledge that pre and post exercise glucose checks will be done for 3 sessions at entry of program.   Education: Know Your Numbers and Risk Factors: -Group verbal and written instruction about important numbers in your health.  Discussion of what are risk factors and how they play a role in the disease process.  Review of Cholesterol, Blood Pressure, Diabetes, and BMI and the role they play in your overall health.   Core Components/Risk Factors/Patient Goals Review:  Goals and Risk Factor Review    Row Name 12/10/19 0824 12/26/19 0837           Core Components/Risk Factors/Patient Goals Review   Personal Goals Review  Weight Management/Obesity;Hypertension;Lipids  --      Review  KKendleis doing well overall.  His weight has been pretty steady and he eats well.  It was noted to have gone up over the weekend some but he is not sure why.  His blood pressures have been good and recently it has been trying to be better about hydrating. He doesn't check at home regularly but since it has been low in class we talked about checking it a little more frequently.  KDiannahas been checking BP at home.  It  has been 1120-130/80 at home.  He continues to focus on heart healthy eating.  KHoldynhad lithotripsy last week and has been hydrating extra.      Expected Outcomes  Short: Work on weight coming back down and check pressures more frequently Long: Continue to monitor risk factors.  Short:  continue eating healthy and exercising consistently Long:  manage risk factors         Core Components/Risk Factors/Patient Goals at Discharge (Final Review):  Goals and Risk Factor Review - 12/26/19 0837      Core Components/Risk Factors/Patient Goals Review   Review  KAntionnehas been checking BP at home.  It has been 1120-130/80 at home.  He continues to focus on heart healthy eating.  KKhylinhad lithotripsy last week and has been hydrating extra.    Expected Outcomes  Short:  continue eating healthy and exercising consistently Long:  manage risk factors       ITP Comments: ITP Comments    Row Name 11/09/19 1008 11/12/19 1029 11/14/19 0859 11/21/19 0622 12/18/19 1035   ITP Comments  Initial phone orientation completed. [facility] EP orientation scheduled for 3/15 at 8am  Completed 6MWT and gym orientation.  Initial ITP created and sent for review to Dr. MEmily Filbert Medical Director.  First full day of exercise!  Patient was oriented to gym and equipment including functions, settings, policies, and procedures.  Patient's individual exercise prescription and treatment plan were reviewed.  All starting workloads were established based on the results of the 6 minute walk test done at initial orientation visit.  The plan for exercise progression was also introduced and progression will be customized based on patient's performance and goals.  30 day chart review completed. ITP sent to Dr MZachery DakinsMedical Director, for review,changes as needed and signature. Continue with ITP if no changes requested  Completed Initial RD Eval   Row Name 12/19/19 0552 01/16/20 0607         ITP Comments   30 Day review  completed. Medical Director review done, changes made as directed,and approval shown by signature of Market researcher.  30 Day review completed. ITP review done, changes made as directed,and approval shown by signature of  Scientist, research (life sciences).         Comments:

## 2020-01-18 ENCOUNTER — Other Ambulatory Visit: Payer: Self-pay

## 2020-01-18 ENCOUNTER — Encounter: Payer: Medicare Other | Admitting: *Deleted

## 2020-01-18 DIAGNOSIS — Z955 Presence of coronary angioplasty implant and graft: Secondary | ICD-10-CM

## 2020-01-18 DIAGNOSIS — I252 Old myocardial infarction: Secondary | ICD-10-CM | POA: Diagnosis not present

## 2020-01-18 DIAGNOSIS — I214 Non-ST elevation (NSTEMI) myocardial infarction: Secondary | ICD-10-CM

## 2020-01-18 NOTE — Progress Notes (Signed)
Daily Session Note  Patient Details  Name: Philip Mack MRN: [id number] Date of Birth: 10/26/1950 Referring Provider:     Cardiac Rehab from 11/12/2019 in [facility] Cardiac and Pulmonary Rehab  Referring Provider  [name] [facility] Cardiologist Dr. [name]      Encounter Date: 01/18/2020  Check In: Session Check In - 01/18/20 0816      Check-In   Supervising physician immediately available to respond to emergencies  See telemetry face sheet for immediately available ER MD    Location  [facility]-Cardiac & Pulmonary Rehab    Staff Present  Alberteen Sam, MA, RCEP, CCRP, CCET;Joseph [place];[name], RN, BSN, CCRP    Virtual Visit  No    Medication changes reported      No    Fall or balance concerns reported     No    Warm-up and Cool-down  Performed on first and last piece of equipment    Resistance Training Performed  Yes    VAD Patient?  No    PAD/SET Patient?  No      Pain Assessment   Currently in Pain?  No/denies          Social History   Tobacco Use  Smoking Status Former Smoker  . Types: Cigarettes  . Quit date: 08/30/1980  . Years since quitting: 39.4  Smokeless Tobacco Never Used    Goals Met:  Independence with exercise equipment Exercise tolerated well No report of cardiac concerns or symptoms  Goals Unmet:  Not Applicable  Comments: Pt able to follow exercise prescription today without complaint.  Will continue to monitor for progression.    [name] is Medical Director for [place] and [facility].

## 2020-01-21 ENCOUNTER — Other Ambulatory Visit: Payer: Self-pay

## 2020-01-21 ENCOUNTER — Encounter: Payer: Medicare Other | Admitting: *Deleted

## 2020-01-21 DIAGNOSIS — Z955 Presence of coronary angioplasty implant and graft: Secondary | ICD-10-CM

## 2020-01-21 DIAGNOSIS — I214 Non-ST elevation (NSTEMI) myocardial infarction: Secondary | ICD-10-CM

## 2020-01-21 DIAGNOSIS — I252 Old myocardial infarction: Secondary | ICD-10-CM | POA: Diagnosis not present

## 2020-01-21 NOTE — Progress Notes (Signed)
Daily Session Note  Patient Details  Name: Philip Mack MRN: [id number] Date of Birth: 05-Oct-1950 Referring Provider:     Cardiac Rehab from 11/12/2019 in [facility] Cardiac and Pulmonary Rehab  Referring Provider  [name] [facility] Cardiologist Dr. [name]      Encounter Date: 01/21/2020  Check In: Session Check In - 01/21/20 0819      Check-In   Supervising physician immediately available to respond to emergencies  See telemetry face sheet for immediately available ER MD    Location  [facility]-Cardiac & Pulmonary Rehab    Staff Present  [name], RN, BSN, Laveda Norman, BS, ACSM CEP, Exercise Physiologist;Joseph Tessie Fass RCP,RRT,BSRT    Virtual Visit  No    Medication changes reported      No    Fall or balance concerns reported     No    Warm-up and Cool-down  Performed on first and last piece of equipment    Resistance Training Performed  Yes    VAD Patient?  No    PAD/SET Patient?  No      Pain Assessment   Currently in Pain?  No/denies          Social History   Tobacco Use  Smoking Status Former Smoker  . Types: Cigarettes  . Quit date: 08/30/1980  . Years since quitting: 39.4  Smokeless Tobacco Never Used    Goals Met:  Independence with exercise equipment Exercise tolerated well No report of cardiac concerns or symptoms  Goals Unmet:  Not Applicable  Comments: Pt able to follow exercise prescription today without complaint.  Will continue to monitor for progression.    [name] is Medical Director for [place] and [facility].

## 2020-01-23 ENCOUNTER — Encounter: Payer: Medicare Other | Admitting: *Deleted

## 2020-01-23 ENCOUNTER — Other Ambulatory Visit: Payer: Self-pay

## 2020-01-23 DIAGNOSIS — I252 Old myocardial infarction: Secondary | ICD-10-CM | POA: Diagnosis not present

## 2020-01-23 DIAGNOSIS — Z955 Presence of coronary angioplasty implant and graft: Secondary | ICD-10-CM | POA: Diagnosis not present

## 2020-01-23 DIAGNOSIS — I214 Non-ST elevation (NSTEMI) myocardial infarction: Secondary | ICD-10-CM

## 2020-01-23 NOTE — Progress Notes (Signed)
Daily Session Note  Patient Details  Name: Philip Mack MRN: [id number] Date of Birth: 07-12-1951 Referring Provider:     Cardiac Rehab from 11/12/2019 in [facility] Cardiac and Pulmonary Rehab  Referring Provider  [name] [facility] Cardiologist Dr. [name]      Encounter Date: 01/23/2020  Check In: Session Check In - 01/23/20 0852      Check-In   Supervising physician immediately available to respond to emergencies  See telemetry face sheet for immediately available ER MD    Location  [facility]-Cardiac & Pulmonary Rehab    Staff Present  [name], RN, BSN, CCRP;Joseph Foy [place], [place], ACSM CEP, Exercise Physiologist    Virtual Visit  No    Medication changes reported      No    Fall or balance concerns reported     No    Warm-up and Cool-down  Performed on first and last piece of equipment    Resistance Training Performed  Yes    VAD Patient?  No    PAD/SET Patient?  No      Pain Assessment   Currently in Pain?  No/denies          Social History   Tobacco Use  Smoking Status Former Smoker  . Types: Cigarettes  . Quit date: 08/30/1980  . Years since quitting: 39.4  Smokeless Tobacco Never Used    Goals Met:  Independence with exercise equipment Exercise tolerated well No report of cardiac concerns or symptoms  Goals Unmet:  Not Applicable  Comments: Pt able to follow exercise prescription today without complaint.  Will continue to monitor for progression.    [name] is Medical Director for [place] and [facility].

## 2020-01-25 ENCOUNTER — Other Ambulatory Visit: Payer: Self-pay

## 2020-01-25 ENCOUNTER — Encounter: Payer: Medicare Other | Admitting: *Deleted

## 2020-01-25 VITALS — Ht 76.4 in | Wt 211.5 lb

## 2020-01-25 DIAGNOSIS — Z955 Presence of coronary angioplasty implant and graft: Secondary | ICD-10-CM

## 2020-01-25 DIAGNOSIS — I252 Old myocardial infarction: Secondary | ICD-10-CM | POA: Diagnosis not present

## 2020-01-25 DIAGNOSIS — I214 Non-ST elevation (NSTEMI) myocardial infarction: Secondary | ICD-10-CM

## 2020-01-25 NOTE — Progress Notes (Signed)
Daily Session Note  Patient Details  Name: Philip Mack MRN: [id number] Date of Birth: 08/08/1951 Referring Provider:     Cardiac Rehab from 11/12/2019 in [facility] Cardiac and Pulmonary Rehab  Referring Provider  Callwood, Dwayne MD [Primary Cardiologist Dr. Bruce Kowalski]      Encounter Date: 01/25/2020  Check In: Session Check In - 01/25/20 0817      Check-In   Supervising physician immediately available to respond to emergencies  See telemetry face sheet for immediately available ER MD    Location  [facility]-Cardiac & Pulmonary Rehab    Staff Present  Susanne Bice, RN, BSN, CCRP;Joseph Hood RCP,RRT,BSRT;Jessica Hawkins, MA, RCEP, CCRP, CCET    Virtual Visit  No    Medication changes reported      No    Fall or balance concerns reported     No    Warm-up and Cool-down  Performed on first and last piece of equipment    Resistance Training Performed  Yes    VAD Patient?  No    PAD/SET Patient?  No      Pain Assessment   Currently in Pain?  No/denies          Social History   Tobacco Use  Smoking Status Former Smoker  . Types: Cigarettes  . Quit date: 08/30/1980  . Years since quitting: 39.4  Smokeless Tobacco Never Used    Goals Met:  Independence with exercise equipment Exercise tolerated well Personal goals reviewed No report of cardiac concerns or symptoms  Goals Unmet:  Not Applicable  Comments: Pt able to follow exercise prescription today without complaint.  Will continue to monitor for progression.  6 Minute Walk    Row Name 11/12/19 1032 01/25/20 0839       6 Minute Walk   Phase  Initial  Discharge    Distance  1540 feet  1905 feet    Distance % Change  --  23.7 %    Distance Feet Change  --  365 ft    Walk Time  6 minutes  6 minutes    # of Rest Breaks  0  0    MPH  2.92  3.61    METS  3.65  4.21    RPE  11  13    VO2 Peak  12.77  14.75    Symptoms  No  No    Resting HR  67 bpm  75 bpm    Resting BP  122/64  110/60    Resting Oxygen  Saturation   98 %  --    Exercise Oxygen Saturation  during 6 min walk  95 %  --    Max Ex. HR  84 bpm  83 bpm    Max Ex. BP  146/74  136/64    2 Minute Post BP  104/66  --         Dr. Mark Miller is Medical Director for [facility] and [facility]. 

## 2020-01-26 ENCOUNTER — Other Ambulatory Visit: Payer: Self-pay | Admitting: Urology

## 2020-01-30 ENCOUNTER — Encounter: Payer: Medicare Other | Attending: Internal Medicine | Admitting: *Deleted

## 2020-01-30 ENCOUNTER — Other Ambulatory Visit: Payer: Self-pay

## 2020-01-30 DIAGNOSIS — I214 Non-ST elevation (NSTEMI) myocardial infarction: Secondary | ICD-10-CM

## 2020-01-30 DIAGNOSIS — Z955 Presence of coronary angioplasty implant and graft: Secondary | ICD-10-CM | POA: Diagnosis not present

## 2020-01-30 DIAGNOSIS — I252 Old myocardial infarction: Secondary | ICD-10-CM | POA: Diagnosis present

## 2020-01-30 NOTE — Progress Notes (Signed)
Daily Session Note  Patient Details  Name: Philip Mack MRN: [id number] Date of Birth: [birth year] Referring Provider:     Cardiac Rehab from 11/12/2019 in [facility] Cardiac and Pulmonary Rehab  Referring Provider  [name] [facility] Cardiologist Dr. [name]      Encounter Date: 01/30/2020  Check In: Session Check In - 01/30/20 0817      Check-In   Supervising physician immediately available to respond to emergencies  See telemetry face sheet for immediately available ER MD    Location  [facility]-Cardiac & Pulmonary Rehab    Staff Present  Nyoka Cowden, RN, BSN, Willette Pa, MA, RCEP, CCRP, [place], [place], ACSM CEP, Exercise Physiologist    Virtual Visit  No    Medication changes reported      No    Fall or balance concerns reported     No    Tobacco Cessation  No Change    Warm-up and Cool-down  Performed on first and last piece of equipment    Resistance Training Performed  No    VAD Patient?  No    PAD/SET Patient?  No      Pain Assessment   Currently in Pain?  No/denies          Social History   Tobacco Use  Smoking Status Former Smoker   Types: Cigarettes   Quit date: 08/30/1980   Years since quitting: 39.4  Smokeless Tobacco Never Used    Goals Met:  Independence with exercise equipment Exercise tolerated well No report of cardiac concerns or symptoms  Goals Unmet:  Not Applicable  Comments: Pt able to follow exercise prescription today without complaint.  Will continue to monitor for progression.   [name] is Medical Director for [place] and [facility].

## 2020-02-01 ENCOUNTER — Other Ambulatory Visit: Payer: Self-pay

## 2020-02-01 ENCOUNTER — Encounter: Payer: Medicare Other | Admitting: *Deleted

## 2020-02-01 DIAGNOSIS — I252 Old myocardial infarction: Secondary | ICD-10-CM | POA: Diagnosis not present

## 2020-02-01 DIAGNOSIS — I214 Non-ST elevation (NSTEMI) myocardial infarction: Secondary | ICD-10-CM

## 2020-02-01 NOTE — Progress Notes (Signed)
Daily Session Note  Patient Details  Name: Philip Mack MRN: [id number] Date of Birth: 1951/02/24 Referring Provider:     Cardiac Rehab from 11/12/2019 in [facility] Cardiac and Pulmonary Rehab  Referring Provider  [name] [facility] Cardiologist Dr. [name]      Encounter Date: 02/01/2020  Check In: Session Check In - 02/01/20 0809      Check-In   Location  [facility]-Cardiac & Pulmonary Rehab    Staff Present  Nyoka Cowden, RN, BSN, MA;Susanne Bice, RN, BSN, CCRP;Melissa Caiola RDN, LDN    Virtual Visit  No    Medication changes reported      No    Fall or balance concerns reported     No    Tobacco Cessation  No Change    Warm-up and Cool-down  Performed on first and last piece of equipment    Resistance Training Performed  Yes    VAD Patient?  No    PAD/SET Patient?  No      Pain Assessment   Currently in Pain?  No/denies          Social History   Tobacco Use  Smoking Status Former Smoker  . Types: Cigarettes  . Quit date: 08/30/1980  . Years since quitting: 39.4  Smokeless Tobacco Never Used    Goals Met:  Independence with exercise equipment Exercise tolerated well No report of cardiac concerns or symptoms  Goals Unmet:  Not Applicable  Comments: Pt able to follow exercise prescription today without complaint.  Will continue to monitor for progression.    [name] is Medical Director for [place] and [facility].

## 2020-02-04 ENCOUNTER — Other Ambulatory Visit: Payer: Self-pay

## 2020-02-04 ENCOUNTER — Encounter: Payer: Medicare Other | Admitting: *Deleted

## 2020-02-04 DIAGNOSIS — I214 Non-ST elevation (NSTEMI) myocardial infarction: Secondary | ICD-10-CM

## 2020-02-04 DIAGNOSIS — Z955 Presence of coronary angioplasty implant and graft: Secondary | ICD-10-CM

## 2020-02-04 DIAGNOSIS — I252 Old myocardial infarction: Secondary | ICD-10-CM | POA: Diagnosis not present

## 2020-02-04 NOTE — Progress Notes (Signed)
Daily Session Note  Patient Details  Name: Philip Mack MRN: [id number] Date of Birth: 06-04-1951 Referring Provider:     Cardiac Rehab from 11/12/2019 in [facility] Cardiac and Pulmonary Rehab  Referring Provider  [name] [facility] Cardiologist Dr. [name]      Encounter Date: 02/04/2020  Check In: Session Check In - 02/04/20 0957      Check-In   Supervising physician immediately available to respond to emergencies  See telemetry face sheet for immediately available ER MD    Location  [facility]-Cardiac & Pulmonary Rehab    Staff Present  [name], RN, BSN, Jacklynn Bue, MS Exercise Physiologist;Kelly Amedeo Plenty, BS, ACSM CEP, Exercise Physiologist;Joseph Tessie Fass RCP,RRT,BSRT    Virtual Visit  No    Medication changes reported      No    Fall or balance concerns reported     No    Warm-up and Cool-down  Performed on first and last piece of equipment    Resistance Training Performed  Yes    VAD Patient?  No    PAD/SET Patient?  No      Pain Assessment   Currently in Pain?  No/denies          Social History   Tobacco Use  Smoking Status Former Smoker  . Types: Cigarettes  . Quit date: 08/30/1980  . Years since quitting: 39.4  Smokeless Tobacco Never Used    Goals Met:  Independence with exercise equipment Exercise tolerated well No report of cardiac concerns or symptoms Strength training completed today  Goals Unmet:  Not Applicable  Comments: Pt able to follow exercise prescription today without complaint.  Will continue to monitor for progression.    [name] is Medical Director for [place] and [facility].

## 2020-02-06 ENCOUNTER — Other Ambulatory Visit: Payer: Self-pay

## 2020-02-06 ENCOUNTER — Encounter: Payer: Medicare Other | Admitting: *Deleted

## 2020-02-06 DIAGNOSIS — I252 Old myocardial infarction: Secondary | ICD-10-CM | POA: Diagnosis not present

## 2020-02-06 DIAGNOSIS — I214 Non-ST elevation (NSTEMI) myocardial infarction: Secondary | ICD-10-CM

## 2020-02-06 DIAGNOSIS — Z955 Presence of coronary angioplasty implant and graft: Secondary | ICD-10-CM

## 2020-02-06 NOTE — Progress Notes (Signed)
Daily Session Note  Patient Details  Name: Philip Mack MRN: [id number] Date of Birth: 02-23-1951 Referring Provider:     Cardiac Rehab from 11/12/2019 in [facility] Cardiac and Pulmonary Rehab  Referring Provider  [name] [facility] Cardiologist Dr. [name]      Encounter Date: 02/06/2020  Check In: Session Check In - 02/06/20 0937      Check-In   Supervising physician immediately available to respond to emergencies  See telemetry face sheet for immediately available ER MD    Location  [facility]-Cardiac & Pulmonary Rehab    Staff Present  [name], RN, BSN, CCRP;Melissa Caiola RDN, LDN;Joseph Hood RCP,RRT,BSRT    Virtual Visit  No    Medication changes reported      No    Fall or balance concerns reported     No    Warm-up and Cool-down  Performed on first and last piece of equipment    Resistance Training Performed  Yes    VAD Patient?  No    PAD/SET Patient?  No      Pain Assessment   Currently in Pain?  No/denies          Social History   Tobacco Use  Smoking Status Former Smoker  . Types: Cigarettes  . Quit date: 08/30/1980  . Years since quitting: 39.4  Smokeless Tobacco Never Used    Goals Met:  Independence with exercise equipment Exercise tolerated well No report of cardiac concerns or symptoms  Goals Unmet:  Not Applicable  Comments: Pt able to follow exercise prescription today without complaint.  Will continue to monitor for progression.    [name] is Medical Director for [place] and [facility].

## 2020-02-06 NOTE — Patient Instructions (Signed)
Discharge Patient Instructions  Patient Details  Name: Philip Mack MRN: [id number] Date of Birth: [birth year] Referring Provider:  Yolonda Kida, MD   Number of Visits: 35  Reason for Discharge:  Patient reached a stable level of exercise. Patient independent in their exercise. Patient has met program and personal goals.  Smoking History:  Social History   Tobacco Use  Smoking Status Former Smoker  . Types: Cigarettes  . Quit date: 08/30/1980  . Years since quitting: 39.4  Smokeless Tobacco Never Used    Diagnosis:  No diagnosis found.  Initial Exercise Prescription: Initial Exercise Prescription - 11/12/19 1000      Date of Initial Exercise RX and Referring Provider   Date  11/12/19    Referring Provider  [name]   Primary Cardiologist Dr. [name]     Treadmill   MPH  2.9    Grade  1    Minutes  15    METs  3.6      Elliptical   Level  2    Speed  3.5    Minutes  15    METs  3      REL-XR   Level  3    Speed  50    Minutes  15    METs  3      Prescription Details   Frequency (times per week)  3    Duration  Progress to 30 minutes of continuous aerobic without signs/symptoms of physical distress      Intensity   THRR 40-80% of Max Heartrate  101-135    Ratings of Perceived Exertion  11-13    Perceived Dyspnea  0-4      Progression   Progression  Continue to progress workloads to maintain intensity without signs/symptoms of physical distress.      Resistance Training   Training Prescription  Yes    Weight  3 lb    Reps  10-15       Discharge Exercise Prescription (Final Exercise Prescription Changes): Exercise Prescription Changes - 01/21/20 1600      Response to Exercise   Blood Pressure (Admit)  118/80    Blood Pressure (Exercise)  142/58    Blood Pressure (Exit)  126/70    Heart Rate (Admit)  80 bpm    Heart Rate (Exercise)  118 bpm    Heart Rate (Exit)  81 bpm    Rating of Perceived Exertion (Exercise)   14    Symptoms  none    Duration  Continue with 30 min of aerobic exercise without signs/symptoms of physical distress.    Intensity  THRR unchanged      Progression   Progression  Continue to progress workloads to maintain intensity without signs/symptoms of physical distress.    Average METs  5.76      Resistance Training   Training Prescription  Yes    Weight  5 lb    Reps  10-15      Interval Training   Interval Training  Yes    Equipment  Treadmill;REL-XR    Comments  14mn on 1 min off      Treadmill   MPH  3.5    Grade  5    Minutes  15    METs  6.09      Elliptical   Level  6    Speed  4.2    Minutes  15    METs  4.6  REL-XR   Level  12    Minutes  15    METs  6.6      Home Exercise Plan   Plans to continue exercise at  Home (comment)   walking, [employer], community gym   Frequency  Add 4 additional days to program exercise sessions.    Initial Home Exercises Provided  12/10/19       Functional Capacity: 6 Minute Walk    Row Name 11/12/19 1032 01/25/20 0839       6 Minute Walk   Phase  Initial  Discharge    Distance  1540 feet  1905 feet    Distance % Change  --  23.7 %    Distance Feet Change  --  365 ft    Walk Time  6 minutes  6 minutes    # of Rest Breaks  0  0    MPH  2.92  3.61    METS  3.65  4.21    RPE  11  13    VO2 Peak  12.77  14.75    Symptoms  No  No    Resting HR  67 bpm  75 bpm    Resting BP  122/64  110/60    Resting Oxygen Saturation   98 %  --    Exercise Oxygen Saturation  during 6 min walk  95 %  --    Max Ex. HR  84 bpm  83 bpm    Max Ex. BP  146/74  136/64    2 Minute Post BP  104/66  --       Quality of Life: Quality of Life - 01/30/20 0916      Quality of Life   Select  Quality of Life      Quality of Life Scores   Health/Function Pre  23 %    Health/Function Post  23.6 %    Health/Function % Change  2.61 %    Socioeconomic Pre  24.67 %    Socioeconomic Post  25.92 %    Socioeconomic % Change   5.07 %     Psych/Spiritual Pre  22.86 %    Psych/Spiritual Post  22.93 %    Psych/Spiritual % Change  0.31 %    Family Pre  27.6 %    Family Post  27.6 %    Family % Change  0 %    GLOBAL Pre  23.97 %    GLOBAL Post  24.48 %    GLOBAL % Change  2.13 %       Nutrition & Weight - Outcomes: Pre Biometrics - 11/12/19 1038      Pre Biometrics   Height  6' 4.4" (1.941 m)    Weight  211 lb 9.6 oz (96 kg)    BMI (Calculated)  25.48    Single Leg Stand  4.5 seconds      Post Biometrics - 01/25/20 0840       Post  Biometrics   Height  6' 4.4" (1.941 m)    Weight  211 lb 8 oz (95.9 kg)    BMI (Calculated)  25.46       Nutrition: Nutrition Therapy & Goals - 12/18/19 1001      Nutrition Therapy   Diet  Low Na, HH    Drug/Food Interactions  Statins/Certain Fruits    Protein (specify units)  80-85g    Fiber  30 grams    Whole Grain Foods  3 servings    Saturated Fats  12 max. grams    Fruits and Vegetables  5 servings/day    Sodium  1.5 grams      Personal Nutrition Goals   Nutrition Goal  ST: continue with current changes, read labels to make sure no trans fat/ low Na, include variety LT: get back to normal activity (usually)    Comments  B: couple servings of fruit (apples, peaches, pears, berries, mangto, banana), oatmeal and cereal (looking for fiber or protein), muffins (baked homemade, instead of oil will use appple sauce). L: 4-5x/week his wife will make something: not cooking with much salt or fat S: sweet or salty. D: light fare; protein bar with some fruit, deli meat with some cheese, usually fruit included. ice cream on sunday afternoon. Discussed HH eating.      Intervention Plan   Intervention  Prescribe, educate and counsel regarding individualized specific dietary modifications aiming towards targeted core components such as weight, hypertension, lipid management, diabetes, heart failure and other comorbidities.;Nutrition handout(s) given to patient.    Expected Outcomes  Short  Term Goal: Understand basic principles of dietary content, such as calories, fat, sodium, cholesterol and nutrients.;Short Term Goal: A plan has been developed with personal nutrition goals set during dietitian appointment.;Long Term Goal: Adherence to prescribed nutrition plan.       Nutrition Discharge: Nutrition Assessments - 01/30/20 0916      MEDFICTS Scores   Pre Score  42    Post Score  39    Score Difference  -3       Education Questionnaire Score: Knowledge Questionnaire Score - 01/30/20 0916      Knowledge Questionnaire Score   Pre Score  25/26 only missed pulse question    Post Score  25/26       Goals reviewed with patient; copy given to patient.

## 2020-02-08 ENCOUNTER — Encounter: Payer: Medicare Other | Admitting: *Deleted

## 2020-02-08 ENCOUNTER — Other Ambulatory Visit: Payer: Self-pay

## 2020-02-08 DIAGNOSIS — I252 Old myocardial infarction: Secondary | ICD-10-CM | POA: Diagnosis not present

## 2020-02-08 DIAGNOSIS — I214 Non-ST elevation (NSTEMI) myocardial infarction: Secondary | ICD-10-CM

## 2020-02-08 DIAGNOSIS — Z955 Presence of coronary angioplasty implant and graft: Secondary | ICD-10-CM

## 2020-02-08 NOTE — Progress Notes (Signed)
Daily Session Note  Patient Details  Name: Philip Mack MRN: [id number] Date of Birth: August 21, 1951 Referring Provider:     Cardiac Rehab from 11/12/2019 in [facility] Cardiac and Pulmonary Rehab  Referring Provider [name]  [facility] Cardiologist Dr. [name]      Encounter Date: 02/08/2020  Check In:  Session Check In - 02/08/20 0838      Check-In   Supervising physician immediately available to respond to emergencies See telemetry face sheet for immediately available ER MD    Location [facility]-Cardiac & Pulmonary Rehab    Staff Present [name], RN, BSN, CCRP;Jessica [place], MA, RCEP, CCRP, CCET;Joseph [place] RCP,RRT,BSRT    Virtual Visit No    Medication changes reported     No    Fall or balance concerns reported    No    Warm-up and Cool-down Performed on first and last piece of equipment    Resistance Training Performed Yes    VAD Patient? No    PAD/SET Patient? No      Pain Assessment   Currently in Pain? No/denies              Social History   Tobacco Use  Smoking Status Former Smoker  . Types: Cigarettes  . Quit date: 08/30/1980  . Years since quitting: 39.4  Smokeless Tobacco Never Used    Goals Met:  Independence with exercise equipment Exercise tolerated well No report of cardiac concerns or symptoms  Goals Unmet:  Not Applicable  Comments: Pt able to follow exercise prescription today without complaint.  Will continue to monitor for progression.    [name] is Medical Director for [place] and [facility].

## 2020-02-08 NOTE — Progress Notes (Signed)
Cardiac Individual Treatment Plan  Patient Details  Name: Philip Mack MRN: [id number] Date of Birth: 1951-02-17 Referring Provider:     Cardiac Rehab from 11/12/2019 in [facility] Cardiac and Pulmonary Rehab  Referring Provider [name]  [facility] Cardiologist Dr. [name]      Initial Encounter Date:    Cardiac Rehab from 11/12/2019 in [facility] Cardiac and Pulmonary Rehab  Date 11/12/19      Visit Diagnosis: NSTEMI (non-ST elevated myocardial infarction) [facility])  Status post coronary artery stent placement  Patient's Home Medications on Admission:  Current Outpatient Medications:    aspirin 81 MG chewable tablet, Chew 1 tablet (81 mg total) by mouth daily., Disp:  , Rfl:    atorvastatin (LIPITOR) 80 MG tablet, Take 80 mg by mouth every evening. , Disp: , Rfl:    metoprolol tartrate (LOPRESSOR) 25 MG tablet, Take 0.5 tablets (12.5 mg total) by mouth 2 (two) times daily., Disp: 30 tablet, Rfl: 0   oxybutynin (DITROPAN) 5 MG tablet, 1 tab tid prn frequency,urgency, bladder spasm, Disp: 15 tablet, Rfl: 0   tamsulosin (FLOMAX) 0.4 MG CAPS capsule, TAKE 1 CAPSULE BY MOUTH EVERY DAY, Disp: 30 capsule, Rfl: 1   ticagrelor (BRILINTA) 90 MG TABS tablet, Take 90 mg by mouth 2 (two) times daily., Disp: , Rfl:   Past Medical History: Past Medical History:  Diagnosis Date   Cataract    Hepatitis    Hep A?   History of kidney stones    Hyperlipidemia    Myocardial infarction ([place])    10/12/19    Tobacco Use: Social History   Tobacco Use  Smoking Status Former Smoker   Types: Cigarettes   Quit date: 08/30/1980   Years since quitting: 39.4  Smokeless Tobacco Never Used    Labs: Recent Chemical engineer    Labs for ITP Cardiac and Pulmonary Rehab Latest Ref Rng & Units 07/14/2015 07/14/2016 07/19/2018 07/31/2019 10/12/2019   Cholestrol 0 - 200 mg/dL 232(H) 213(H) 218(H) 233(H) 231(H)   LDLCALC 0 - 99 mg/dL 151(H) 130(H) 144(H) 157(H) 156(H)   LDLDIRECT  mg/dL - - - - -   HDL >40 mg/dL 61.30 67.90 61.60 62.50 67   Trlycerides <150 mg/dL 98.0 76.0 64.0 70.0 41       Exercise Target Goals: Exercise Program Goal: Individual exercise prescription set using results from initial 6 min walk test and THRR while considering  patients activity barriers and safety.   Exercise Prescription Goal: Initial exercise prescription builds to 30-45 minutes a day of aerobic activity, 2-3 days per week.  Home exercise guidelines will be given to patient during program as part of exercise prescription that the participant will acknowledge.   Education: Aerobic Exercise & Resistance Training: - Gives group verbal and written instruction on the various components of exercise. Focuses on aerobic and resistive training programs and the benefits of this training and how to safely progress through these programs..   Education: Exercise & Equipment Safety: - Individual verbal instruction and demonstration of equipment use and safety with use of the equipment.   Cardiac Rehab from 11/12/2019 in [facility] Cardiac and Pulmonary Rehab  Date 11/12/19  Educator [facility]  Instruction Review Code 1- Verbalizes Understanding      Education: Exercise Physiology & General Exercise Guidelines: - Group verbal and written instruction with models to review the exercise physiology of the cardiovascular system and associated critical values. Provides general exercise guidelines with specific guidelines to those with heart or lung disease.  Education: Flexibility, Balance, Mind/Body Relaxation: Provides group verbal/written instruction on the benefits of flexibility and balance training, including mind/body exercise modes such as yoga, pilates and tai chi.  Demonstration and skill practice provided.   Activity Barriers & Risk Stratification:  Activity Barriers & Cardiac Risk Stratification - 11/12/19 1035      Activity Barriers & Cardiac Risk Stratification   Activity Barriers Other  (comment);Joint Problems;Balance Concerns;Deconditioning;Muscular Weakness    Comments current kidney stone pain; shoulder pain    Cardiac Risk Stratification Moderate           6 Minute Walk:  6 Minute Walk    Row Name 11/12/19 1032 01/25/20 0839       6 Minute Walk   Phase Initial Discharge    Distance 1540 feet 1905 feet    Distance % Change -- 23.7 %    Distance Feet Change -- 365 ft    Walk Time 6 minutes 6 minutes    # of Rest Breaks 0 0    MPH 2.92 3.61    METS 3.65 4.21    RPE 11 13    VO2 Peak 12.77 14.75    Symptoms No No    Resting HR 67 bpm 75 bpm    Resting BP 122/64 110/60    Resting Oxygen Saturation  98 % --    Exercise Oxygen Saturation  during 6 min walk 95 % --    Max Ex. HR 84 bpm 83 bpm    Max Ex. BP 146/74 136/64    2 Minute Post BP 104/66 --           Oxygen Initial Assessment:   Oxygen Re-Evaluation:   Oxygen Discharge (Final Oxygen Re-Evaluation):   Initial Exercise Prescription:  Initial Exercise Prescription - 11/12/19 1000      Date of Initial Exercise RX and Referring Provider   Date 11/12/19    Referring Provider [name]   Primary Cardiologist Dr. [name]     Treadmill   MPH 2.9    Grade 1    Minutes 15    METs 3.6      Elliptical   Level 2    Speed 3.5    Minutes 15    METs 3      REL-XR   Level 3    Speed 50    Minutes 15    METs 3      Prescription Details   Frequency (times per week) 3    Duration Progress to 30 minutes of continuous aerobic without signs/symptoms of physical distress      Intensity   THRR 40-80% of Max Heartrate 101-135    Ratings of Perceived Exertion 11-13    Perceived Dyspnea 0-4      Progression   Progression Continue to progress workloads to maintain intensity without signs/symptoms of physical distress.      Resistance Training   Training Prescription Yes    Weight 3 lb    Reps 10-15           Perform Capillary Blood Glucose checks as  needed.  Exercise Prescription Changes:  Exercise Prescription Changes    Row Name 11/12/19 1000 11/27/19 0700 12/13/19 1200 12/25/19 1500 01/07/20 1600     Response to Exercise   Blood Pressure (Admit) 122/64 138/77 104/58 118/80 142/72   Blood Pressure (Exercise) 146/74 160/72 144/58 138/62 130/60   Blood Pressure (Exit) 104/44 1'20/84 96/54 94/60 ' 98/68   Heart Rate (Admit) 67 bpm  78 bpm 68 bpm 73 bpm 65 bpm   Heart Rate (Exercise) 84 bpm 119 bpm 113 bpm 109 bpm 102 bpm   Heart Rate (Exit) 63 bpm 79 bpm 68 bpm 70 bpm 72 bpm   Oxygen Saturation (Admit) 98 % -- -- -- --   Oxygen Saturation (Exercise) 95 % -- -- -- --   Rating of Perceived Exertion (Exercise) '11 13 14 14 15   ' Symptoms none none none none none   Comments walk test results -- -- -- --   Duration -- Continue with 30 min of aerobic exercise without signs/symptoms of physical distress. Continue with 30 min of aerobic exercise without signs/symptoms of physical distress. Continue with 30 min of aerobic exercise without signs/symptoms of physical distress. Continue with 30 min of aerobic exercise without signs/symptoms of physical distress.   Intensity -- THRR unchanged THRR unchanged THRR unchanged THRR unchanged     Progression   Progression -- Continue to progress workloads to maintain intensity without signs/symptoms of physical distress. Continue to progress workloads to maintain intensity without signs/symptoms of physical distress. Continue to progress workloads to maintain intensity without signs/symptoms of physical distress. Continue to progress workloads to maintain intensity without signs/symptoms of physical distress.   Average METs -- 4.37 4.65 4.87 5.3     Resistance Training   Training Prescription -- Yes Yes Yes Yes   Weight -- 3 lb 5 lb 5 lb 5 lb   Reps -- 10-15 10-15 10-15 10-15     Interval Training   Interval Training -- No No No Yes   Equipment -- -- -- -- Treadmill     Treadmill   MPH -- 3.5 3.5 3.5  3.5   Grade -- '2 2 2 2  ' 2-10%   Minutes -- '15 15 15 15   ' METs -- 4.64 4.65 4.64 4.65     Elliptical   Level -- '3 5 6 ' --   Speed -- '3 3 3 ' --   Minutes -- '15 15 15 ' --   METs -- 4.1 -- 5.1 --     REL-XR   Level -- -- -- -- 4   Speed -- -- -- -- 50   Minutes -- -- -- -- 15   METs -- -- -- -- 6     Home Exercise Plan   Plans to continue exercise at -- -- -- Home (comment)  walking, [employer], community gym --   Frequency -- -- -- Add 4 additional days to program exercise sessions. --   Initial Home Exercises Provided -- -- -- 12/10/19 --   [place] Name 01/21/20 1600             Response to Exercise   Blood Pressure (Admit) 118/80       Blood Pressure (Exercise) 142/58       Blood Pressure (Exit) 126/70       Heart Rate (Admit) 80 bpm       Heart Rate (Exercise) 118 bpm       Heart Rate (Exit) 81 bpm       Rating of Perceived Exertion (Exercise) 14       Symptoms none       Duration Continue with 30 min of aerobic exercise without signs/symptoms of physical distress.       Intensity THRR unchanged         Progression   Progression Continue to progress workloads to maintain intensity without signs/symptoms of physical distress.  Average METs 5.76         Resistance Training   Training Prescription Yes       Weight 5 lb       Reps 10-15         Interval Training   Interval Training Yes       Equipment Treadmill;REL-XR       Comments 53mn on 1 min off         Treadmill   MPH 3.5       Grade 5       Minutes 15       METs 6.09         Elliptical   Level 6       Speed 4.2       Minutes 15       METs 4.6         REL-XR   Level 12       Minutes 15       METs 6.6         Home Exercise Plan   Plans to continue exercise at Home (comment)  walking, [employer], community gym       Frequency Add 4 additional days to program exercise sessions.       Initial Home Exercises Provided 12/10/19              Exercise Comments:  Exercise Comments    Row Name 11/14/19 0900  02/08/20 0912         Exercise Comments First full day of exercise!  Patient was oriented to gym and equipment including functions, settings, policies, and procedures.  Patient's individual exercise prescription and treatment plan were reviewed.  All starting workloads were established based on the results of the 6 minute walk test done at initial orientation visit.  The plan for exercise progression was also introduced and progression will be customized based on patient's performance and goals. KJeidengraduated today from  rehab with 36 sessions completed.  Details of the patient's exercise prescription and what He needs to do in order to continue the prescription and progress were discussed with patient.  Patient was given a copy of prescription and goals.  Patient verbalized understanding.  KJafarplans to continue to exercise by walking, going to [employer] and the gym at home.  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             Exercise Goals and Review:  Exercise Goals    Row Name 11/12/19 1038             Exercise Goals   Increase Physical Activity Yes       Intervention Provide advice, education, support and counseling about physical activity/exercise needs.;Develop an individualized exercise prescription for aerobic and resistive training based on initial evaluation findings, risk stratification, comorbidities and participant's personal goals.       Expected Outcomes Short Term: Attend rehab on a regular basis to increase amount of physical activity.;Long Term: Add in home exercise to make exercise part of routine and to increase amount of physical activity.;Long Term: Exercising regularly at least 3-5 days a week.       Increase Strength and Stamina Yes       Intervention Provide advice, education, support and counseling about physical activity/exercise needs.;Develop an individualized exercise prescription for aerobic and resistive training based on initial  evaluation findings, risk stratification, comorbidities and participant's personal goals.  Expected Outcomes Short Term: Increase workloads from initial exercise prescription for resistance, speed, and METs.;Short Term: Perform resistance training exercises routinely during rehab and add in resistance training at home;Long Term: Improve cardiorespiratory fitness, muscular endurance and strength as measured by increased METs and functional capacity (6MWT)       Able to understand and use rate of perceived exertion (RPE) scale Yes       Intervention Provide education and explanation on how to use RPE scale       Expected Outcomes Short Term: Able to use RPE daily in rehab to express subjective intensity level;Long Term:  Able to use RPE to guide intensity level when exercising independently       Able to understand and use Dyspnea scale Yes       Intervention Provide education and explanation on how to use Dyspnea scale       Expected Outcomes Short Term: Able to use Dyspnea scale daily in rehab to express subjective sense of shortness of breath during exertion;Long Term: Able to use Dyspnea scale to guide intensity level when exercising independently       Knowledge and understanding of Target Heart Rate Range (THRR) Yes       Intervention Provide education and explanation of THRR including how the numbers were predicted and where they are located for reference       Expected Outcomes Short Term: Able to state/look up THRR;Short Term: Able to use daily as guideline for intensity in rehab;Long Term: Able to use THRR to govern intensity when exercising independently       Able to check pulse independently Yes       Intervention Provide education and demonstration on how to check pulse in carotid and radial arteries.;Review the importance of being able to check your own pulse for safety during independent exercise       Expected Outcomes Short Term: Able to explain why pulse checking is important  during independent exercise;Long Term: Able to check pulse independently and accurately       Understanding of Exercise Prescription Yes       Intervention Provide education, explanation, and written materials on patient's individual exercise prescription       Expected Outcomes Short Term: Able to explain program exercise prescription;Long Term: Able to explain home exercise prescription to exercise independently              Exercise Goals Re-Evaluation :  Exercise Goals Re-Evaluation    Row Name 11/14/19 0900 11/27/19 0723 12/10/19 0820 12/25/19 1543 01/07/20 1603     Exercise Goal Re-Evaluation   Exercise Goals Review Able to understand and use rate of perceived exertion (RPE) scale;Knowledge and understanding of Target Heart Rate Range (THRR);Understanding of Exercise Prescription Increase Physical Activity;Increase Strength and Stamina;Understanding of Exercise Prescription Increase Physical Activity;Able to understand and use rate of perceived exertion (RPE) scale;Increase Strength and Stamina;Able to understand and use Dyspnea scale;Knowledge and understanding of Target Heart Rate Range (THRR);Able to check pulse independently;Understanding of Exercise Prescription Increase Physical Activity;Increase Strength and Stamina;Understanding of Exercise Prescription Increase Physical Activity;Increase Strength and Stamina;Able to understand and use rate of perceived exertion (RPE) scale;Able to understand and use Dyspnea scale;Knowledge and understanding of Target Heart Rate Range (THRR);Able to check pulse independently   Comments Reviewed RPE scale, THR and program prescription with pt today.  Pt voiced understanding and was given a copy of goals to take home. Kemuel is doing well in rehab.  He has really taken to the  elliptical and having fun with exercise.  He is already on level 3 with it.  We will continue to monitor his progress. Reviewed home exercise with pt today.  Pt plans to continue to go  to [employer] (elliptical, weights, jump rope T/Th) and fitness room in neighborhood for exercise.  He has started to try to run again on the weekend mornings.  Reviewed THR, pulse, RPE, sign and symptoms, NTG use, and when to call 911 or MD.  Also discussed weather considerations and indoor options.  Pt voiced understanding.  He is dong well overall. Treyvion is doing well in rehab.  He is enjoying the challenge of the elliptical and is up to 5.1 METs on there.  We will continue to monitor his progress. Kaeden attends consistently and works at [employer] 11-15.  He does incline between 2-10% on TM.   Expected Outcomes Short: Use RPE daily to regulate intensity. Long: Follow program prescription in THR. Short: Continue to improve on ellipitcal.  Long: Continue to improve stamina. Short: Continue to exericse daily  Long: Continue to improve stamina. Short: Talk about adding in intervals consistently (tried one day)  Long: Continue to improve stamina. --   [place] Name 01/07/20 1613 01/21/20 1623 01/25/20 0840         Exercise Goal Re-Evaluation   Exercise Goals Review -- Increase Physical Activity;Increase Strength and Stamina;Understanding of Exercise Prescription Increase Physical Activity;Increase Strength and Stamina;Understanding of Exercise Prescription     Comments -- Basel is doing well in rehab. He is now at 5% grade on the treadmill and level 12 on the XR.  We will continue to monitor his progress. Miklos improved his post 6MWT by 23.7%!!!  He is plannning to continue to  go to the [employer] after graduation.  He is also going to add in the community gym and maybe try trail running.  He is pleased with his progress and has more stamina.     Expected Outcomes Short: continue intervals Long: continue to improve stamina Short: Continue to use intervals on XR and treadmill Long: Continue to exercise on off days. Short: Continue to attend regularly to finish up Long: Continue to exercise independently            Discharge  Exercise Prescription (Final Exercise Prescription Changes):  Exercise Prescription Changes - 01/21/20 1600      Response to Exercise   Blood Pressure (Admit) 118/80    Blood Pressure (Exercise) 142/58    Blood Pressure (Exit) 126/70    Heart Rate (Admit) 80 bpm    Heart Rate (Exercise) 118 bpm    Heart Rate (Exit) 81 bpm    Rating of Perceived Exertion (Exercise) 14    Symptoms none    Duration Continue with 30 min of aerobic exercise without signs/symptoms of physical distress.    Intensity THRR unchanged      Progression   Progression Continue to progress workloads to maintain intensity without signs/symptoms of physical distress.    Average METs 5.76      Resistance Training   Training Prescription Yes    Weight 5 lb    Reps 10-15      Interval Training   Interval Training Yes    Equipment Treadmill;REL-XR    Comments 44mn on 1 min off      Treadmill   MPH 3.5    Grade 5    Minutes 15    METs 6.09      Elliptical   Level 6  Speed 4.2    Minutes 15    METs 4.6      REL-XR   Level 12    Minutes 15    METs 6.6      Home Exercise Plan   Plans to continue exercise at Home (comment)   walking, [employer], community gym   Frequency Add 4 additional days to program exercise sessions.    Initial Home Exercises Provided 12/10/19           Nutrition:  Target Goals: Understanding of nutrition guidelines, daily intake of sodium <1584m, cholesterol <2067m calories 30% from fat and 7% or less from saturated fats, daily to have 5 or more servings of fruits and vegetables.  Education: Controlling Sodium/Reading Food Labels -Group verbal and written material supporting the discussion of sodium use in heart healthy nutrition. Review and explanation with models, verbal and written materials for utilization of the food label.   Education: General Nutrition Guidelines/Fats and Fiber: -Group instruction provided by verbal, written material, models and posters to present the  general guidelines for heart healthy nutrition. Gives an explanation and review of dietary fats and fiber.   Biometrics:  Pre Biometrics - 11/12/19 1038      Pre Biometrics   Height 6' 4.4" (1.941 m)    Weight 211 lb 9.6 oz (96 kg)    BMI (Calculated) 25.48    Single Leg Stand 4.5 seconds           Post Biometrics - 01/25/20 0840       Post  Biometrics   Height 6' 4.4" (1.941 m)    Weight 211 lb 8 oz (95.9 kg)    BMI (Calculated) 25.46           Nutrition Therapy Plan and Nutrition Goals:  Nutrition Therapy & Goals - 12/18/19 1001      Nutrition Therapy   Diet Low Na, HH    Drug/Food Interactions Statins/Certain Fruits    Protein (specify units) 80-85g    Fiber 30 grams    Whole Grain Foods 3 servings    Saturated Fats 12 max. grams    Fruits and Vegetables 5 servings/day    Sodium 1.5 grams      Personal Nutrition Goals   Nutrition Goal ST: continue with current changes, read labels to make sure no trans fat/ low Na, include variety LT: get back to normal activity (usually)    Comments B: couple servings of fruit (apples, peaches, pears, berries, mangto, banana), oatmeal and cereal (looking for fiber or protein), muffins (baked homemade, instead of oil will use appple sauce). L: 4-5x/week his wife will make something: not cooking with much salt or fat S: sweet or salty. D: light fare; protein bar with some fruit, deli meat with some cheese, usually fruit included. ice cream on sunday afternoon. Discussed HH eating.      Intervention Plan   Intervention Prescribe, educate and counsel regarding individualized specific dietary modifications aiming towards targeted core components such as weight, hypertension, lipid management, diabetes, heart failure and other comorbidities.;Nutrition handout(s) given to patient.    Expected Outcomes Short Term Goal: Understand basic principles of dietary content, such as calories, fat, sodium, cholesterol and nutrients.;Short Term Goal: A  plan has been developed with personal nutrition goals set during dietitian appointment.;Long Term Goal: Adherence to prescribed nutrition plan.           Nutrition Assessments:  Nutrition Assessments - 01/30/20 0916      MEDFICTS Scores   Pre  Score 42    Post Score 39    Score Difference -3           MEDIFICTS Score Key:          ?70 Need to make dietary changes          40-70 Heart Healthy Diet         ? 40 Therapeutic Level Cholesterol Diet  Nutrition Goals Re-Evaluation:  Nutrition Goals Re-Evaluation    [place] Name 01/14/20 [phone] 01/23/20 0831 01/25/20 0843         Goals   Nutrition Goal ST: continue with current changes, read labels to make sure no trans fat/ low Na, include variety LT: get back to normal activity (usually) ST: continue with current changes, read labels to make sure no trans fat, lower saturated fat, include unsaturated fat, and  low Na, include variety LT: get back to normal activity (usually) --     Comment Pt would like to continue with current changes Discussed role of heart healthy fats and reviewed what constitutes as a  heart healthy fat. Discussed importance of variety in foods as well as reviewed low sodium information. Pt reports will continue with his heart healthy diet and continue to focus on plant foods and healthy fats. Paulla Dolly pt paperwork on flavor and low sodium. Doing well with diet and continue to follow heart healthy diet changes.     Expected Outcome ST: continue with current changes, read labels to make sure no trans fat/ low Na, include variety LT: get back to normal activity (usually) ST: continue with current changes, read labels to make sure no trans fat, lower saturated fat, include unsaturated fat, and  low Na, include variety LT: get back to normal activity (usually) Continue with heart healthy diet.            Nutrition Goals Discharge (Final Nutrition Goals Re-Evaluation):  Nutrition Goals Re-Evaluation - 01/25/20 0843       Goals   Comment Doing well with diet and continue to follow heart healthy diet changes.    Expected Outcome Continue with heart healthy diet.           Psychosocial: Target Goals: Acknowledge presence or absence of significant depression and/or stress, maximize coping skills, provide positive support system. Participant is able to verbalize types and ability to use techniques and skills needed for reducing stress and depression.   Education: Depression - Provides group verbal and written instruction on the correlation between heart/lung disease and depressed mood, treatment options, and the stigmas associated with seeking treatment.   Education: Sleep Hygiene -Provides group verbal and written instruction about how sleep can affect your health.  Define sleep hygiene, discuss sleep cycles and impact of sleep habits. Review good sleep hygiene tips.     Education: Stress and Anxiety: - Provides group verbal and written instruction about the health risks of elevated stress and causes of high stress.  Discuss the correlation between heart/lung disease and anxiety and treatment options. Review healthy ways to manage with stress and anxiety.    Initial Review & Psychosocial Screening:  Initial Psych Review & Screening - 11/09/19 1011      Initial Review   Current issues with Current Stress Concerns    Comments current kidney stone, president of church council, sons out state      [place]? Yes   wife     Barriers   Psychosocial barriers to participate in program There  are no identifiable barriers or psychosocial needs.;The patient should benefit from training in stress management and relaxation.      Screening Interventions   Interventions Encouraged to exercise;To provide support and resources with identified psychosocial needs    Expected Outcomes Short Term goal: Utilizing psychosocial counselor, staff and physician to assist with identification of  specific Stressors or current issues interfering with healing process. Setting desired goal for each stressor or current issue identified.;Long Term Goal: Stressors or current issues are controlled or eliminated.;Short Term goal: Identification and review with participant of any Quality of Life or Depression concerns found by scoring the questionnaire.;Long Term goal: The participant improves quality of Life and PHQ9 Scores as seen by post scores and/or verbalization of changes           Quality of Life Scores:   Quality of Life - 01/30/20 0916      Quality of Life   Select Quality of Life      Quality of Life Scores   Health/Function Pre 23 %    Health/Function Post 23.6 %    Health/Function % Change 2.61 %    Socioeconomic Pre 24.67 %    Socioeconomic Post 25.92 %    Socioeconomic % Change  5.07 %    Psych/Spiritual Pre 22.86 %    Psych/Spiritual Post 22.93 %    Psych/Spiritual % Change 0.31 %    Family Pre 27.6 %    Family Post 27.6 %    Family % Change 0 %    GLOBAL Pre 23.97 %    GLOBAL Post 24.48 %    GLOBAL % Change 2.13 %          Scores of 19 and below usually indicate a poorer quality of life in these areas.  A difference of  2-3 points is a clinically meaningful difference.  A difference of 2-3 points in the total score of the Quality of Life Index has been associated with significant improvement in overall quality of life, self-image, physical symptoms, and general health in studies assessing change in quality of life.  PHQ-9: Recent Review Flowsheet Data    Depression screen [facility] 2/9 01/30/2020 11/12/2019 10/19/2019 07/31/2019 07/19/2018   Decreased Interest 0 1 0 0 0   Down, Depressed, Hopeless 0 0 0 0 0   PHQ - 2 Score 0 1 0 0 0   Altered sleeping 0 0 - - -   Tired, decreased energy 1 1 - - -   Change in appetite 1 0 - - -   Feeling bad or failure about yourself  0 0 - - -   Trouble concentrating 0 0 - - -   Moving slowly or fidgety/restless 0 0 - - -   Suicidal  thoughts 0 0 - - -   PHQ-9 Score 2 2 - - -   Difficult doing work/chores Not difficult at all Not difficult at all - - -     Interpretation of Total Score  Total Score Depression Severity:  1-4 = Minimal depression, 5-9 = Mild depression, 10-14 = Moderate depression, 15-19 = Moderately severe depression, 20-27 = Severe depression   Psychosocial Evaluation and Intervention:  Psychosocial Evaluation - 01/25/20 0842      Discharge Psychosocial Assessment & Intervention   Comments Camp has done well in rehab. He has enjoyed getting back into the rountine of pushing himself with exercise and getting to know others.  He sleeps good and will continue to stick with his  exercise for mental boost.           Psychosocial Re-Evaluation:  Psychosocial Re-Evaluation    Row Name 12/10/19 8527 12/26/19 0839           Psychosocial Re-Evaluation   Current issues with Current Stress Concerns --      Comments Abdikadir is doing well mentally.  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 His wife also had an emergency appendectomy 2 weeks ago.  All of this has limited their ability to travel and they hope to get back to it in May.  Overall, he is good.  He sleeps very well. Mont continues to do well mentally.  He sleeps well.  They do have trips planned in May.      Expected Outcomes Short: Make it through May.  Long: Continue to get in exercise for stress relief. Short:  continue to exercise Long:  maintain positive outlook      Interventions Encouraged to attend Cardiac Rehabilitation for the exercise;Stress management education --             Psychosocial Discharge (Final Psychosocial Re-Evaluation):  Psychosocial Re-Evaluation - 12/26/19 0839      Psychosocial Re-Evaluation   Comments Kern continues to do well mentally.  He sleeps well.  They do have trips planned in May.    Expected Outcomes Short:  continue to exercise Long:  maintain  positive outlook           Vocational Rehabilitation: Provide vocational rehab assistance to qualifying candidates.   Vocational Rehab Evaluation & Intervention:  Vocational Rehab - 11/09/19 1011      Initial Vocational Rehab Evaluation & Intervention   Assessment shows need for Vocational Rehabilitation No           Education: Education Goals: Education classes will be provided on a variety of topics geared toward better understanding of heart health and risk factor modification. Participant will state understanding/return demonstration of topics presented as noted by education test scores.  Learning Barriers/Preferences:  Learning Barriers/Preferences - 11/09/19 1011      Learning Barriers/Preferences   Learning Barriers None    Learning Preferences None           General Cardiac Education Topics:  AED/CPR: - Group verbal and written instruction with the use of models to demonstrate the basic use of the AED with the basic ABC's of resuscitation.   Anatomy & Physiology of the Heart: - Group verbal and written instruction and models provide basic cardiac anatomy and physiology, with the coronary electrical and arterial systems. Review of Valvular disease and Heart Failure   Cardiac Procedures: - Group verbal and written instruction to review commonly prescribed medications for heart disease. Reviews the medication, class of the drug, and side effects. Includes the steps to properly store meds and maintain the prescription regimen. (beta blockers and nitrates)   Cardiac Medications I: - Group verbal and written instruction to review commonly prescribed medications for heart disease. Reviews the medication, class of the drug, and side effects. Includes the steps to properly store meds and maintain the prescription regimen.   Cardiac Medications II: -Group verbal and written instruction to review commonly prescribed medications for heart disease. Reviews the  medication, class of the drug, and side effects. (all other drug classes)    Go Sex-Intimacy & Heart Disease, Get SMART - Goal Setting: - Group verbal and written instruction through game format to  discuss heart disease and the return to sexual intimacy. Provides group verbal and written material to discuss and apply goal setting through the application of the S.M.A.R.T. Method.   Other Matters of the Heart: - Provides group verbal, written materials and models to describe Stable Angina and Peripheral Artery. Includes description of the disease process and treatment options available to the cardiac patient.   Infection Prevention: - Provides verbal and written material to individual with discussion of infection control including proper hand washing and proper equipment cleaning during exercise session.   Cardiac Rehab from 11/12/2019 in [facility] Cardiac and Pulmonary Rehab  Date 11/12/19  Educator [facility]  Instruction Review Code 1- Verbalizes Understanding      Falls Prevention: - Provides verbal and written material to individual with discussion of falls prevention and safety.   Cardiac Rehab from 11/12/2019 in [facility] Cardiac and Pulmonary Rehab  Date 11/12/19  Educator [facility]  Instruction Review Code 1- Verbalizes Understanding      Other: -Provides group and verbal instruction on various topics (see comments)   Knowledge Questionnaire Score:  Knowledge Questionnaire Score - 01/30/20 0916      Knowledge Questionnaire Score   Pre Score 25/26 only missed pulse question    Post Score 25/26           Core Components/Risk Factors/Patient Goals at Admission:  Personal Goals and Risk Factors at Admission - 11/12/19 1042      Core Components/Risk Factors/Patient Goals on Admission    Weight Management Yes;Weight Loss    Intervention Weight Management: Develop a combined nutrition and exercise program designed to reach desired caloric intake, while maintaining appropriate intake of  nutrient and fiber, sodium and fats, and appropriate energy expenditure required for the weight goal.;Weight Management: Provide education and appropriate resources to help participant work on and attain dietary goals.    Admit Weight 211 lb 9.6 oz (96 kg)    Goal Weight: Short Term 206 lb (93.4 kg)    Goal Weight: Long Term 200 lb (90.7 kg)    Expected Outcomes Long Term: Adherence to nutrition and physical activity/exercise program aimed toward attainment of established weight goal;Short Term: Continue to assess and modify interventions until short term weight is achieved;Weight Loss: Understanding of general recommendations for a balanced deficit meal plan, which promotes 1-2 lb weight loss per week and includes a negative energy balance of [phone] kcal/d;Understanding of distribution of calorie intake throughout the day with the consumption of 4-5 meals/snacks;Understanding recommendations for meals to include 15-35% energy as protein, 25-35% energy from fat, 35-60% energy from carbohydrates, less than 246m of dietary cholesterol, 20-35 gm of total fiber daily    Hypertension Yes    Intervention Provide education on lifestyle modifcations including regular physical activity/exercise, weight management, moderate sodium restriction and increased consumption of fresh fruit, vegetables, and low fat dairy, alcohol moderation, and smoking cessation.;Monitor prescription use compliance.    Expected Outcomes Long Term: Maintenance of blood pressure at goal levels.;Short Term: Continued assessment and intervention until BP is < 140/958mHG in hypertensive participants. < 130/8069mG in hypertensive participants with diabetes, heart failure or chronic kidney disease.    Lipids Yes    Intervention Provide education and support for participant on nutrition & aerobic/resistive exercise along with prescribed medications to achieve LDL <73m69mDL >40mg43m Expected Outcomes Short Term: Participant states  understanding of desired cholesterol values and is compliant with medications prescribed. Participant is following exercise prescription and nutrition guidelines.;Long Term: Cholesterol controlled with  medications as prescribed, with individualized exercise RX and with personalized nutrition plan. Value goals: LDL < 16m, HDL > 40 mg.           Education:Diabetes - Individual verbal and written instruction to review signs/symptoms of diabetes, desired ranges of glucose level fasting, after meals and with exercise. Acknowledge that pre and post exercise glucose checks will be done for 3 sessions at entry of program.   Education: Know Your Numbers and Risk Factors: -Group verbal and written instruction about important numbers in your health.  Discussion of what are risk factors and how they play a role in the disease process.  Review of Cholesterol, Blood Pressure, Diabetes, and BMI and the role they play in your overall health.   Core Components/Risk Factors/Patient Goals Review:   Goals and Risk Factor Review    Row Name 12/10/19 0824 12/26/19 0837 01/25/20 0843         Core Components/Risk Factors/Patient Goals Review   Personal Goals Review Weight Management/Obesity;Hypertension;Lipids -- Weight Management/Obesity;Hypertension;Lipids     Review KZenis doing well overall.  His weight has been pretty steady and he eats well.  It was noted to have gone up over the weekend some but he is not sure why.  His blood pressures have been good and recently it has been trying to be better about hydrating. He doesn't check at home regularly but since it has been low in class we talked about checking it a little more frequently. KEamonnhas been checking BP at home.  It has been 1120-130/80 at home.  He continues to focus on heart healthy eating.  KClintonhad lithotripsy last week and has been hydrating extra. KThanhis doing well with weight and BP.  He continues to check them at home.  He is going to  continue to keep a close eye on things.     Expected Outcomes Short: Work on weight coming back down and check pressures more frequently Long: Continue to monitor risk factors. Short:  continue eating healthy and exercising consistently Long:  manage risk factors Continue to monitor risk factors.            Core Components/Risk Factors/Patient Goals at Discharge (Final Review):   Goals and Risk Factor Review - 01/25/20 0843      Core Components/Risk Factors/Patient Goals Review   Personal Goals Review Weight Management/Obesity;Hypertension;Lipids    Review KOatheris doing well with weight and BP.  He continues to check them at home.  He is going to continue to keep a close eye on things.    Expected Outcomes Continue to monitor risk factors.           ITP Comments:  ITP Comments    Row Name 11/09/19 1008 11/12/19 1029 11/14/19 0859 11/21/19 0622 12/18/19 1035   ITP Comments Initial phone orientation completed. [facility] EP orientation scheduled for 3/15 at 8am Completed 6MWT and gym orientation.  Initial ITP created and sent for review to Dr. MEmily Filbert Medical Director. First full day of exercise!  Patient was oriented to gym and equipment including functions, settings, policies, and procedures.  Patient's individual exercise prescription and treatment plan were reviewed.  All starting workloads were established based on the results of the 6 minute walk test done at initial orientation visit.  The plan for exercise progression was also introduced and progression will be customized based on patient's performance and goals. 30 day chart review completed. ITP sent to Dr  Kittanning Director, for review,changes as needed and signature. Continue with ITP if no changes requested Completed Initial RD Eval   Row Name 12/19/19 0552 01/16/20 0607 02/08/20 0912       ITP Comments 30 Day review completed. Medical Director review done, changes made as  directed,and approval shown by signature of Market researcher. 30 Day review completed. ITP review done, changes made as directed,and approval shown by signature of  Scientist, research (life sciences). Discharge ITP sent and signed by Dr. Sabra Heck.  Discharge Summary routed to PCP and cardiologist.            Comments: Discharge ITP

## 2020-02-08 NOTE — Progress Notes (Signed)
Discharge Progress Report  Patient Details  Name: Philip Mack MRN: [id number] Date of Birth: [birth year] Referring Provider:     Cardiac Rehab from 11/12/2019 in [facility] Cardiac and Pulmonary Rehab  Referring Provider [name]  [facility] Cardiologist Dr. [name]       Number of Visits: 36  Reason for Discharge:  Patient reached a stable level of exercise. Patient independent in their exercise. Patient has met program and personal goals.  Smoking History:  Social History   Tobacco Use  Smoking Status Former Smoker  . Types: Cigarettes  . Quit date: 08/30/1980  . Years since quitting: 39.4  Smokeless Tobacco Never Used    Diagnosis:  NSTEMI (non-ST elevated myocardial infarction) ([place])  Status post coronary artery stent placement  ADL UCSD:   Initial Exercise Prescription:  Initial Exercise Prescription - 11/12/19 1000      Date of Initial Exercise RX and Referring Provider   Date 11/12/19    Referring Provider [name]   Primary Cardiologist Dr. [name]     Treadmill   MPH 2.9    Grade 1    Minutes 15    METs 3.6      Elliptical   Level 2    Speed 3.5    Minutes 15    METs 3      REL-XR   Level 3    Speed 50    Minutes 15    METs 3      Prescription Details   Frequency (times per week) 3    Duration Progress to 30 minutes of continuous aerobic without signs/symptoms of physical distress      Intensity   THRR 40-80% of Max Heartrate 101-135    Ratings of Perceived Exertion 11-13    Perceived Dyspnea 0-4      Progression   Progression Continue to progress workloads to maintain intensity without signs/symptoms of physical distress.      Resistance Training   Training Prescription Yes    Weight 3 lb    Reps 10-15           Discharge Exercise Prescription (Final Exercise Prescription Changes):  Exercise Prescription Changes - 01/21/20 1600      Response to Exercise   Blood Pressure (Admit) 118/80     Blood Pressure (Exercise) 142/58    Blood Pressure (Exit) 126/70    Heart Rate (Admit) 80 bpm    Heart Rate (Exercise) 118 bpm    Heart Rate (Exit) 81 bpm    Rating of Perceived Exertion (Exercise) 14    Symptoms none    Duration Continue with 30 min of aerobic exercise without signs/symptoms of physical distress.    Intensity THRR unchanged      Progression   Progression Continue to progress workloads to maintain intensity without signs/symptoms of physical distress.    Average METs 5.76      Resistance Training   Training Prescription Yes    Weight 5 lb    Reps 10-15      Interval Training   Interval Training Yes    Equipment Treadmill;REL-XR    Comments 7mn on 1 min off      Treadmill   MPH 3.5    Grade 5    Minutes 15    METs 6.09      Elliptical   Level 6    Speed 4.2    Minutes 15    METs 4.6      REL-XR  Level 12    Minutes 15    METs 6.6      Home Exercise Plan   Plans to continue exercise at Home (comment)   walking, [employer], community gym   Frequency Add 4 additional days to program exercise sessions.    Initial Home Exercises Provided 12/10/19           Functional Capacity:  6 Minute Walk    Row Name 11/12/19 1032 01/25/20 0839       6 Minute Walk   Phase Initial Discharge    Distance 1540 feet 1905 feet    Distance % Change -- 23.7 %    Distance Feet Change -- 365 ft    Walk Time 6 minutes 6 minutes    # of Rest Breaks 0 0    MPH 2.92 3.61    METS 3.65 4.21    RPE 11 13    VO2 Peak 12.77 14.75    Symptoms No No    Resting HR 67 bpm 75 bpm    Resting BP 122/64 110/60    Resting Oxygen Saturation  98 % --    Exercise Oxygen Saturation  during 6 min walk 95 % --    Max Ex. HR 84 bpm 83 bpm    Max Ex. BP 146/74 136/64    2 Minute Post BP 104/66 --           Psychological, QOL, Others - Outcomes: PHQ 2/9: Depression screen [facility] 2/9 01/30/2020 11/12/2019 10/19/2019 07/31/2019 07/19/2018  Decreased Interest 0 1 0 0 0  Down, Depressed,  Hopeless 0 0 0 0 0  PHQ - 2 Score 0 1 0 0 0  Altered sleeping 0 0 - - -  Tired, decreased energy 1 1 - - -  Change in appetite 1 0 - - -  Feeling bad or failure about yourself  0 0 - - -  Trouble concentrating 0 0 - - -  Moving slowly or fidgety/restless 0 0 - - -  Suicidal thoughts 0 0 - - -  PHQ-9 Score 2 2 - - -  Difficult doing work/chores Not difficult at all Not difficult at all - - -    Quality of Life:  Quality of Life - 01/30/20 0916      Quality of Life   Select Quality of Life      Quality of Life Scores   Health/Function Pre 23 %    Health/Function Post 23.6 %    Health/Function % Change 2.61 %    Socioeconomic Pre 24.67 %    Socioeconomic Post 25.92 %    Socioeconomic % Change  5.07 %    Psych/Spiritual Pre 22.86 %    Psych/Spiritual Post 22.93 %    Psych/Spiritual % Change 0.31 %    Family Pre 27.6 %    Family Post 27.6 %    Family % Change 0 %    GLOBAL Pre 23.97 %    GLOBAL Post 24.48 %    GLOBAL % Change 2.13 %             Nutrition & Weight - Outcomes:  Pre Biometrics - 11/12/19 1038      Pre Biometrics   Height 6' 4.4" (1.941 m)    Weight 211 lb 9.6 oz (96 kg)    BMI (Calculated) 25.48    Single Leg Stand 4.5 seconds           Post Biometrics - 01/25/20 0840  Post  Biometrics   Height 6' 4.4" (1.941 m)    Weight 211 lb 8 oz (95.9 kg)    BMI (Calculated) 25.46           Nutrition:  Nutrition Therapy & Goals - 12/18/19 1001      Nutrition Therapy   Diet Low Na, HH    Drug/Food Interactions Statins/Certain Fruits    Protein (specify units) 80-85g    Fiber 30 grams    Whole Grain Foods 3 servings    Saturated Fats 12 max. grams    Fruits and Vegetables 5 servings/day    Sodium 1.5 grams      Personal Nutrition Goals   Nutrition Goal ST: continue with current changes, read labels to make sure no trans fat/ low Na, include variety LT: get back to normal activity (usually)    Comments B: couple servings of fruit (apples,  peaches, pears, berries, mangto, banana), oatmeal and cereal (looking for fiber or protein), muffins (baked homemade, instead of oil will use appple sauce). L: 4-5x/week his wife will make something: not cooking with much salt or fat S: sweet or salty. D: light fare; protein bar with some fruit, deli meat with some cheese, usually fruit included. ice cream on sunday afternoon. Discussed HH eating.      Intervention Plan   Intervention Prescribe, educate and counsel regarding individualized specific dietary modifications aiming towards targeted core components such as weight, hypertension, lipid management, diabetes, heart failure and other comorbidities.;Nutrition handout(s) given to patient.    Expected Outcomes Short Term Goal: Understand basic principles of dietary content, such as calories, fat, sodium, cholesterol and nutrients.;Short Term Goal: A plan has been developed with personal nutrition goals set during dietitian appointment.;Long Term Goal: Adherence to prescribed nutrition plan.           Nutrition Discharge:  Nutrition Assessments - 01/30/20 0916      MEDFICTS Scores   Pre Score 42    Post Score 39    Score Difference -3           Education Questionnaire Score:  Knowledge Questionnaire Score - 01/30/20 0916      Knowledge Questionnaire Score   Pre Score 25/26 only missed pulse question    Post Score 25/26           Goals reviewed with patient; copy given to patient.

## 2020-02-08 NOTE — Progress Notes (Signed)
Matthe graduated today from  rehab with 36 sessions completed.  Details of the patient's exercise prescription and what He needs to do in order to continue the prescription and progress were discussed with patient.  Patient was given a copy of prescription and goals.  Patient verbalized understanding.  Cale plans to continue to exercise by walking, going to [employer], and the gym at home.  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

## 2020-02-14 ENCOUNTER — Ambulatory Visit (INDEPENDENT_AMBULATORY_CARE_PROVIDER_SITE_OTHER): Payer: Medicare Other | Admitting: Urology

## 2020-02-14 ENCOUNTER — Other Ambulatory Visit: Payer: Self-pay

## 2020-02-14 ENCOUNTER — Encounter: Payer: Self-pay | Admitting: Urology

## 2020-02-14 VITALS — BP 102/63 | HR 49 | Ht 76.0 in | Wt 209.4 lb

## 2020-02-14 DIAGNOSIS — Z9889 Other specified postprocedural states: Secondary | ICD-10-CM

## 2020-02-14 DIAGNOSIS — N200 Calculus of kidney: Secondary | ICD-10-CM | POA: Diagnosis not present

## 2020-02-14 NOTE — Patient Instructions (Signed)

## 2020-02-14 NOTE — Progress Notes (Signed)
02/14/2020 1:25 PM   [name] [birth year] [id number]  Referring provider: Venia Carbon, MD [address] [place],  [place] [address]  Chief Complaint  Patient presents with   Routine Post Op    Urologic history: 1.  Urinary tract stone disease -Episode renal colic 01/9484 secondary to a 7 mm left distal ureteral calculus -Initial shockwave lithotripsy without significant fragmentation -Subsequent ureteroscopic removal 12/2019 -Treatment delayed due to recent MI and cardiology clearance -CT with punctate bilateral renal calculi -Stone analysis 100% calcium oxalate monohydrate  2.  Peyronie's disease   HPI: 69 y.o. male presents for postop follow-up.  -He removed his stent 2 days postop and had no problems until 2 days later when he developed gross hematuria -Had no pain but was seen in ED -RUS performed which showed no hydronephrosis -Hematuria subsequently resolved and he has no complaints -Stone analysis 100% calcium oxalate monohydrate -Has increased his water intake since diagnosed with the stone   PMH: Past Medical History:  Diagnosis Date   Cataract    Hepatitis    Hep A?   History of kidney stones    Hyperlipidemia    Myocardial infarction [facility])    10/12/19    Surgical History: Past Surgical History:  Procedure Laterality Date   Arm injury  1975   COLONOSCOPY     CORONARY STENT INTERVENTION N/A 10/15/2019   Procedure: CORONARY STENT INTERVENTION;  Surgeon: Yolonda Kida, MD;  Location: [facility];  Service: Cardiovascular;  Laterality: N/A;   CYSTOSCOPY/URETEROSCOPY/HOLMIUM LASER/STENT PLACEMENT Left 01/08/2020   Procedure: CYSTOSCOPY/URETEROSCOPY/HOLMIUM LASER/STENT PLACEMENT;  Surgeon: Abbie Sons, MD;  Location: [facility];  Service: Urology;  Laterality: Left;   EXTRACORPOREAL SHOCK WAVE LITHOTRIPSY Left 12/20/2019   Procedure: EXTRACORPOREAL SHOCK WAVE LITHOTRIPSY (ESWL);  Surgeon: Hollice Espy, MD;   Location: [facility];  Service: Urology;  Laterality: Left;   HERNIA REPAIR  1959   Bilateral, inguinal   LEFT HEART CATH AND CORONARY ANGIOGRAPHY N/A 10/15/2019   Procedure: LEFT HEART CATH AND CORONARY ANGIOGRAPHY;  Surgeon: Corey Skains, MD;  Location: [facility];  Service: Cardiovascular;  Laterality: N/A;   POLYPECTOMY     TONSILLECTOMY AND ADENOIDECTOMY  1961    Home Medications:  Allergies as of 02/14/2020   No Known Allergies     Medication List       Accurate as of February 14, 2020  1:25 PM. If you have any questions, ask your nurse or doctor.        STOP taking these medications   oxybutynin 5 MG tablet Commonly known as: DITROPAN Stopped by: Abbie Sons, MD   tamsulosin 0.4 MG Caps capsule Commonly known as: FLOMAX Stopped by: Abbie Sons, MD     TAKE these medications   aspirin 81 MG chewable tablet Chew 1 tablet (81 mg total) by mouth daily.   atorvastatin 80 MG tablet Commonly known as: LIPITOR Take 80 mg by mouth every evening.   metoprolol tartrate 25 MG tablet Commonly known as: LOPRESSOR Take 0.5 tablets (12.5 mg total) by mouth 2 (two) times daily.   ticagrelor 90 MG Tabs tablet Commonly known as: BRILINTA Take 90 mg by mouth 2 (two) times daily.       Allergies: No Known Allergies  Family History: Family History  Problem Relation Age of Onset   Cancer Mother        Lung, (surgically resected)   Heart disease Mother    Hearing loss  Other        On Mom's side   Hypertension Neg Hx    Diabetes Neg Hx    Colon cancer Neg Hx    Colon polyps Neg Hx    Stomach cancer Neg Hx    Rectal cancer Neg Hx     Social History:  reports that he quit smoking about 39 years ago. His smoking use included cigarettes. He has never used smokeless tobacco. He reports current alcohol use. He reports that he does not use drugs.   Physical Exam: BP 102/63    Pulse (!) 49    Ht 6\' 4"  (1.93 m)    Wt 209 lb 6.4 oz (95 kg)     BMI 25.49 kg/m   Constitutional:  Alert and oriented, No acute distress. HEENT: [place] AT, moist mucus membranes.  Trachea midline, no masses. Cardiovascular: No clubbing, cyanosis, or edema. Respiratory: Normal respiratory effort, no increased work of breathing. Neurologic: Grossly intact, no focal deficits, moving all 4 extremities. Psychiatric: Normal mood and affect.   Assessment & Plan:    1. Nephrolithiasis Doing well status post ureteroscopic stone removal after failed ESWL.  CT did show bilateral punctate renal calculi and have recommended a metabolic evaluation to include 24-hour urine study and blood work.  He will be notified with the metabolic evaluation results.  Follow-up 6 months with KUB.   Abbie Sons, [place] [address], [place], [place] [address] [phone]

## 2020-03-04 ENCOUNTER — Telehealth: Payer: Self-pay | Admitting: *Deleted

## 2020-03-04 NOTE — Telephone Encounter (Signed)
Patient left a voicemail stating that he had talked with someone a while back about scheduling a follow-up CT scan that he needs to have done. Patient stated that he has been on a trip and planning another soon. Patient is hoping to try and get this scheduled between his travels. Patient requested a call back to discuss getting this CT scan scheduled.

## 2020-03-12 DIAGNOSIS — N202 Calculus of kidney with calculus of ureter: Secondary | ICD-10-CM | POA: Diagnosis not present

## 2020-03-13 ENCOUNTER — Other Ambulatory Visit: Payer: Self-pay

## 2020-03-13 ENCOUNTER — Other Ambulatory Visit: Payer: Medicare Other

## 2020-03-13 DIAGNOSIS — N202 Calculus of kidney with calculus of ureter: Secondary | ICD-10-CM | POA: Diagnosis not present

## 2020-03-14 ENCOUNTER — Other Ambulatory Visit: Payer: Self-pay

## 2020-03-14 ENCOUNTER — Ambulatory Visit
Admission: RE | Admit: 2020-03-14 | Discharge: 2020-03-14 | Disposition: A | Discharge status: Home / Self Care | Payer: Medicare Other | Source: Ambulatory Visit | Attending: Internal Medicine | Admitting: Internal Medicine

## 2020-03-14 DIAGNOSIS — M899 Disorder of bone, unspecified: Secondary | ICD-10-CM

## 2020-03-14 DIAGNOSIS — N400 Enlarged prostate without lower urinary tract symptoms: Secondary | ICD-10-CM | POA: Diagnosis not present

## 2020-03-14 DIAGNOSIS — N22 Calculus of urinary tract in diseases classified elsewhere: Secondary | ICD-10-CM | POA: Diagnosis not present

## 2020-03-14 DIAGNOSIS — I700 Atherosclerosis of aorta: Secondary | ICD-10-CM | POA: Diagnosis not present

## 2020-03-14 DIAGNOSIS — N200 Calculus of kidney: Secondary | ICD-10-CM | POA: Diagnosis not present

## 2020-03-14 DIAGNOSIS — K5790 Diverticulosis of intestine, part unspecified, without perforation or abscess without bleeding: Secondary | ICD-10-CM | POA: Diagnosis not present

## 2020-03-14 NOTE — Telephone Encounter (Signed)
Patient scheduled per Charmaine.

## 2020-03-26 ENCOUNTER — Telehealth: Payer: Self-pay | Admitting: Internal Medicine

## 2020-03-26 DIAGNOSIS — M899 Disorder of bone, unspecified: Secondary | ICD-10-CM

## 2020-03-26 NOTE — Telephone Encounter (Signed)
Discussed the abnormal lytic area with Dr Jasmine December. (Dr Sherrye Payor not available). Unclear if MRI will give more information Will get bone scan---and if that is normal, will just recheck the lesion in about a year

## 2020-04-02 ENCOUNTER — Other Ambulatory Visit: Payer: Self-pay | Admitting: Urology

## 2020-04-17 ENCOUNTER — Encounter
Admission: RE | Admit: 2020-04-17 | Discharge: 2020-04-17 | Disposition: A | Discharge status: Home / Self Care | Payer: Medicare Other | Source: Ambulatory Visit | Attending: Internal Medicine | Admitting: Internal Medicine

## 2020-04-17 ENCOUNTER — Other Ambulatory Visit: Payer: Self-pay

## 2020-04-17 DIAGNOSIS — I10 Essential (primary) hypertension: Secondary | ICD-10-CM | POA: Diagnosis not present

## 2020-04-17 DIAGNOSIS — E782 Mixed hyperlipidemia: Secondary | ICD-10-CM | POA: Diagnosis not present

## 2020-04-17 DIAGNOSIS — I2510 Atherosclerotic heart disease of native coronary artery without angina pectoris: Secondary | ICD-10-CM | POA: Diagnosis not present

## 2020-04-17 DIAGNOSIS — M899 Disorder of bone, unspecified: Secondary | ICD-10-CM | POA: Insufficient documentation

## 2020-04-17 DIAGNOSIS — M898X8 Other specified disorders of bone, other site: Secondary | ICD-10-CM | POA: Diagnosis not present

## 2020-04-17 DIAGNOSIS — I6523 Occlusion and stenosis of bilateral carotid arteries: Secondary | ICD-10-CM | POA: Diagnosis not present

## 2020-04-17 MED ORDER — TECHNETIUM TC 99M MEDRONATE IV KIT
21.9560 | PACK | Freq: Once | INTRAVENOUS | Status: AC | PRN
  Administered 2020-04-17: 21.956 via INTRAVENOUS

## 2020-05-15 ENCOUNTER — Other Ambulatory Visit: Payer: Self-pay

## 2020-05-15 ENCOUNTER — Ambulatory Visit (INDEPENDENT_AMBULATORY_CARE_PROVIDER_SITE_OTHER): Payer: Medicare Other

## 2020-05-15 DIAGNOSIS — Z23 Encounter for immunization: Secondary | ICD-10-CM

## 2020-06-03 DIAGNOSIS — Z23 Encounter for immunization: Secondary | ICD-10-CM | POA: Diagnosis not present

## 2020-08-01 ENCOUNTER — Ambulatory Visit (INDEPENDENT_AMBULATORY_CARE_PROVIDER_SITE_OTHER): Payer: Medicare Other | Admitting: Internal Medicine

## 2020-08-01 ENCOUNTER — Encounter: Payer: Self-pay | Admitting: Internal Medicine

## 2020-08-01 ENCOUNTER — Other Ambulatory Visit: Payer: Self-pay

## 2020-08-01 VITALS — BP 104/74 | HR 71 | Temp 97.8°F | Ht 75.0 in | Wt 205.0 lb

## 2020-08-01 DIAGNOSIS — N138 Other obstructive and reflux uropathy: Secondary | ICD-10-CM

## 2020-08-01 DIAGNOSIS — Z0000 Encounter for general adult medical examination without abnormal findings: Secondary | ICD-10-CM | POA: Diagnosis not present

## 2020-08-01 DIAGNOSIS — I2510 Atherosclerotic heart disease of native coronary artery without angina pectoris: Secondary | ICD-10-CM | POA: Diagnosis not present

## 2020-08-01 DIAGNOSIS — N200 Calculus of kidney: Secondary | ICD-10-CM

## 2020-08-01 DIAGNOSIS — Z7189 Other specified counseling: Secondary | ICD-10-CM

## 2020-08-01 DIAGNOSIS — N401 Enlarged prostate with lower urinary tract symptoms: Secondary | ICD-10-CM | POA: Diagnosis not present

## 2020-08-01 LAB — COMPREHENSIVE METABOLIC PANEL
ALT: 34 U/L (ref 0–53)
AST: 33 U/L (ref 0–37)
Albumin: 4.3 g/dL (ref 3.5–5.2)
Alkaline Phosphatase: 58 U/L (ref 39–117)
BUN: 11 mg/dL (ref 6–23)
CO2: 31 mEq/L (ref 19–32)
Calcium: 9.7 mg/dL (ref 8.4–10.5)
Chloride: 103 mEq/L (ref 96–112)
Creatinine, Ser: 0.97 mg/dL (ref 0.40–1.50)
GFR: 79.88 mL/min (ref >60.00)
Glucose, Bld: 91 mg/dL (ref 70–99)
Potassium: 4.7 mEq/L (ref 3.5–5.1)
Sodium: 138 mEq/L (ref 135–145)
Total Bilirubin: 1 mg/dL (ref 0.2–1.2)
Total Protein: 7.2 g/dL (ref 6.0–8.3)

## 2020-08-01 LAB — CBC
HCT: 43 % (ref 39.0–52.0)
Hemoglobin: 14.6 g/dL (ref 13.0–17.0)
MCHC: 33.9 g/dL (ref 30.0–36.0)
MCV: 89.2 fl (ref 78.0–100.0)
Platelets: 190 10*3/uL (ref 150.0–400.0)
RBC: 4.82 Mil/uL (ref 4.22–5.81)
RDW: 14.1 % (ref 11.5–15.5)
WBC: 6.3 10*3/uL (ref 4.0–10.5)

## 2020-08-01 LAB — LIPID PANEL
Cholesterol: 125 mg/dL (ref 0–200)
HDL: 54.6 mg/dL (ref >39.00)
LDL Cholesterol: 59 mg/dL (ref 0–99)
NonHDL: 70.53
Total CHOL/HDL Ratio: 2
Triglycerides: 57 mg/dL (ref 0.0–149.0)
VLDL: 11.4 mg/dL (ref 0.0–40.0)

## 2020-08-01 NOTE — Assessment & Plan Note (Signed)
I have personally reviewed the Medicare Annual Wellness questionnaire and have noted 1. The patient's medical and social history 2. Their use of alcohol, tobacco or illicit drugs 3. Their current medications and supplements 4. The patient's functional ability including ADL's, fall risks, home safety risks and hearing or visual             impairment. 5. Diet and physical activities 6. Evidence for depression or mood disorders  The patients weight, height, BMI and visual acuity have been recorded in the chart I have made referrals, counseling and provided education to the patient based review of the above and I have provided the pt with a written personalized care plan for preventive services.  I have provided you with a copy of your personalized plan for preventive services. Please take the time to review along with your updated medication list.  Colon due again 2025 Will consider last PSA next year Exercises regularly Had COVID booster and flu vaccine Had shingrix

## 2020-08-01 NOTE — Assessment & Plan Note (Signed)
Doing well now Doesn't feel quite right---likely related to either brilinta or metoprolol. Those will both stop after 1 year On ASA and statin

## 2020-08-01 NOTE — Assessment & Plan Note (Signed)
See social history 

## 2020-08-01 NOTE — Assessment & Plan Note (Signed)
Calcium stone Drinks a lot of fluid Discussed adding dietary citrate

## 2020-08-01 NOTE — Progress Notes (Signed)
Subjective:    Patient ID: Philip Mack, male    DOB: 07-22-1951, 69 y.o.   MRN: [id number]  HPI Here for Medicare wellness visit and follow up of chronic health conditions This visit occurred during the SARS-CoV-2 public health emergency.  Safety protocols were in place, including screening questions prior to the visit, additional usage of staff PPE, and extensive cleaning of exam room while observing appropriate contact time as indicated for disinfecting solutions.   Reviewed form and advanced directives Reviewed other doctors Has occasional wine or beer No tobacco Regular exercise Vision is fine No sig hearing issues No falls  No depression or anhedonia Independent with instrumental ADLs No sig memory issues  Has a lot of days "I don't feel like myself" ?related to brilinta  ?the metoprolol No chest pain or SOB Exercise tolerance is good---working out regularly No dizziness or syncope No edema No palpitations  No symptoms with kidney stones Was calcium stone Did have 24 hour urine--not sure of the results  Voids okay Increased fluids since kidney stones Nocturia mostly once Flow is fine  Current Outpatient Medications on File Prior to Visit  Medication Sig Dispense Refill  . aspirin 81 MG chewable tablet Chew 1 tablet (81 mg total) by mouth daily.    Marland Kitchen atorvastatin (LIPITOR) 80 MG tablet Take 80 mg by mouth every evening.     . metoprolol tartrate (LOPRESSOR) 25 MG tablet Take 0.5 tablets (12.5 mg total) by mouth 2 (two) times daily. 30 tablet 0  . ticagrelor (BRILINTA) 90 MG TABS tablet Take 90 mg by mouth 2 (two) times daily.     No current facility-administered medications on file prior to visit.    No Known Allergies  Past Medical History:  Diagnosis Date  . Cataract   . Hepatitis    Hep A?  . History of kidney stones   . Hyperlipidemia   . Myocardial infarction ([place])    10/12/19    Past Surgical History:  Procedure Laterality Date  . Arm  injury  1975  . COLONOSCOPY    . CORONARY STENT INTERVENTION N/A 10/15/2019   Procedure: CORONARY STENT INTERVENTION;  Surgeon: Yolonda Kida, MD;  Location: [facility];  Service: Cardiovascular;  Laterality: N/A;  . CYSTOSCOPY/URETEROSCOPY/HOLMIUM LASER/STENT PLACEMENT Left 01/08/2020   Procedure: CYSTOSCOPY/URETEROSCOPY/HOLMIUM LASER/STENT PLACEMENT;  Surgeon: Abbie Sons, MD;  Location: [facility];  Service: Urology;  Laterality: Left;  . EXTRACORPOREAL SHOCK WAVE LITHOTRIPSY Left 12/20/2019   Procedure: EXTRACORPOREAL SHOCK WAVE LITHOTRIPSY (ESWL);  Surgeon: Hollice Espy, MD;  Location: [facility];  Service: Urology;  Laterality: Left;  . HERNIA REPAIR  1959   Bilateral, inguinal  . LEFT HEART CATH AND CORONARY ANGIOGRAPHY N/A 10/15/2019   Procedure: LEFT HEART CATH AND CORONARY ANGIOGRAPHY;  Surgeon: Corey Skains, MD;  Location: [facility];  Service: Cardiovascular;  Laterality: N/A;  . POLYPECTOMY    . TONSILLECTOMY AND ADENOIDECTOMY  1961    Family History  Problem Relation Age of Onset  . Cancer Mother        Lung, (surgically resected)  . Heart disease Mother   . Hearing loss Other        On Mom's side  . Hypertension Neg Hx   . Diabetes Neg Hx   . Colon cancer Neg Hx   . Colon polyps Neg Hx   . Stomach cancer Neg Hx   . Rectal cancer Neg Hx     Social History   Socioeconomic  History  . Marital status: Married    Spouse name: Not on file  . Number of children: 2  . Years of education: Not on file  . Highest education level: Not on file  Occupational History  . Occupation: Retired Human resources officer  . Smoking status: Former Smoker    Types: Cigarettes    Quit date: 08/30/1980    Years since quitting: 39.9  . Smokeless tobacco: Never Used  Vaping Use  . Vaping Use: Never used  Substance and Sexual Activity  . Alcohol use: Yes    Comment: Occasionally  . Drug use: No  . Sexual activity: Not on file  Other Topics  Concern  . Not on file  Social History Narrative   Has living will   Wife is health care POA-- then son Randall Hiss   Would accept resuscitation   No tube feeds if cognitively unaware   Social Determinants of Health   Financial Resource Strain:   . Difficulty of Paying Living Expenses: Not on file  Food Insecurity: No Food Insecurity  . Worried About Charity fundraiser in the Last Year: Never true  . Ran Out of Food in the Last Year: Never true  Transportation Needs: No Transportation Needs  . Lack of Transportation (Medical): No  . Lack of Transportation (Non-Medical): No  Physical Activity:   . Days of Exercise per Week: Not on file  . Minutes of Exercise per Session: Not on file  Stress:   . Feeling of Stress : Not on file  Social Connections:   . Frequency of Communication with Friends and Family: Not on file  . Frequency of Social Gatherings with Friends and Family: Not on file  . Attends Religious Services: Not on file  . Active Member of Clubs or Organizations: Not on file  . Attends Archivist Meetings: Not on file  . Marital Status: Not on file  Intimate Partner Violence:   . Fear of Current or Ex-Partner: Not on file  . Emotionally Abused: Not on file  . Physically Abused: Not on file  . Sexually Abused: Not on file   Review of Systems Appetite is fine Weight is stable Sleeps well Teeth okay---keeps up with dentist/periodontist No skin issues---no regular derm appts No heartburn or dysphagia Bowels are fine--no blood No sig joint or back pains    Objective:   Physical Exam Constitutional:      Appearance: Normal appearance.  HENT:     Mouth/Throat:     Comments: No lesions Eyes:     Conjunctiva/sclera: Conjunctivae normal.     Pupils: Pupils are equal, round, and reactive to light.  Cardiovascular:     Rate and Rhythm: Normal rate and regular rhythm.     Pulses: Normal pulses.     Heart sounds: No murmur heard.  No gallop.   Pulmonary:      Effort: Pulmonary effort is normal.     Breath sounds: Normal breath sounds. No wheezing or rales.  Abdominal:     Palpations: Abdomen is soft.     Tenderness: There is no abdominal tenderness.  Musculoskeletal:     Cervical back: Neck supple.     Right lower leg: No edema.     Left lower leg: No edema.  Lymphadenopathy:     Cervical: No cervical adenopathy.  Skin:    General: Skin is warm.     Findings: No rash.  Neurological:     Mental Status: He is  alert and oriented to person, place, and time.     Comments: President-- "[place] Obama" [id number] Recall 3/3  Psychiatric:        Mood and Affect: Mood normal.        Behavior: Behavior normal.            Assessment & Plan:

## 2020-08-01 NOTE — Assessment & Plan Note (Signed)
Symptoms not enough to need tamsulosin at this point

## 2020-08-15 ENCOUNTER — Ambulatory Visit: Payer: Self-pay | Admitting: Urology

## 2020-08-25 ENCOUNTER — Ambulatory Visit (INDEPENDENT_AMBULATORY_CARE_PROVIDER_SITE_OTHER): Payer: Medicare Other | Admitting: Urology

## 2020-08-25 ENCOUNTER — Encounter: Payer: Self-pay | Admitting: Urology

## 2020-08-25 ENCOUNTER — Ambulatory Visit
Admission: RE | Admit: 2020-08-25 | Discharge: 2020-08-25 | Disposition: A | Discharge status: Home / Self Care | Payer: Medicare Other | Source: Ambulatory Visit | Attending: Urology | Admitting: Urology

## 2020-08-25 ENCOUNTER — Ambulatory Visit
Admission: RE | Admit: 2020-08-25 | Discharge: 2020-08-25 | Disposition: A | Discharge status: Home / Self Care | Payer: Medicare Other | Attending: Urology | Admitting: Urology

## 2020-08-25 ENCOUNTER — Other Ambulatory Visit: Payer: Self-pay

## 2020-08-25 VITALS — BP 116/61 | HR 57 | Ht 75.0 in | Wt 204.0 lb

## 2020-08-25 DIAGNOSIS — N200 Calculus of kidney: Secondary | ICD-10-CM | POA: Insufficient documentation

## 2020-08-25 DIAGNOSIS — M5136 Other intervertebral disc degeneration, lumbar region: Secondary | ICD-10-CM | POA: Diagnosis not present

## 2020-08-25 NOTE — Progress Notes (Signed)
08/25/2020 11:36 AM   Philip Mack [birth year] [id number]  Referring provider: Karie Schwalbe, MD [address] [place],  [place] [address]  Chief Complaint  Patient presents with  . Nephrolithiasis    Urologic history: 1.  Urinary tract stone disease -Episode renal colic 10/2019 secondary to a 7 mm left distal ureteral calculus -Initial shockwave lithotripsy without significant fragmentation -Subsequent ureteroscopic removal 12/2019 -Treatment delayed due to recent MI and cardiology clearance -CT with punctate bilateral renal calculi -Stone analysis 100% calcium oxalate monohydrate  2.  Peyronie's disease  HPI: 69 y.o. male presents for follow-up.   Has done well since last visit and denies flank, abdominal or pelvic pain  No bothersome LUTS  Metabolic evaluation remarkable for urine volume >2 L.  The only abnormality was mild elevation of urine uric acid however uric acid supersaturation was normal.  Serum studies were normal including uric acid.     PMH: Past Medical History:  Diagnosis Date  . Cataract   . Hepatitis    Hep A?  . History of kidney stones   . Hyperlipidemia   . Myocardial infarction [facility])    10/12/19    Surgical History: Past Surgical History:  Procedure Laterality Date  . Arm injury  1975  . COLONOSCOPY    . CORONARY STENT INTERVENTION N/A 10/15/2019   Procedure: CORONARY STENT INTERVENTION;  Surgeon: Alwyn Pea, MD;  Location: [facility];  Service: Cardiovascular;  Laterality: N/A;  . CYSTOSCOPY/URETEROSCOPY/HOLMIUM LASER/STENT PLACEMENT Left 01/08/2020   Procedure: CYSTOSCOPY/URETEROSCOPY/HOLMIUM LASER/STENT PLACEMENT;  Surgeon: [name], MD;  Location: [facility];  Service: Urology;  Laterality: Left;  . EXTRACORPOREAL SHOCK WAVE LITHOTRIPSY Left 12/20/2019   Procedure: EXTRACORPOREAL SHOCK WAVE LITHOTRIPSY (ESWL);  Surgeon: Vanna Scotland, MD;  Location: [facility];  Service: Urology;  Laterality:  Left;  . HERNIA REPAIR  1959   Bilateral, inguinal  . LEFT HEART CATH AND CORONARY ANGIOGRAPHY N/A 10/15/2019   Procedure: LEFT HEART CATH AND CORONARY ANGIOGRAPHY;  Surgeon: Lamar Blinks, MD;  Location: [facility];  Service: Cardiovascular;  Laterality: N/A;  . POLYPECTOMY    . TONSILLECTOMY AND ADENOIDECTOMY  1961    Home Medications:  Allergies as of 08/25/2020   No Known Allergies     Medication List       Accurate as of August 25, 2020 11:36 AM. If you have any questions, ask your nurse or doctor.        aspirin 81 MG chewable tablet Chew 1 tablet (81 mg total) by mouth daily.   atorvastatin 80 MG tablet Commonly known as: LIPITOR Take 80 mg by mouth every evening.   metoprolol tartrate 25 MG tablet Commonly known as: LOPRESSOR Take 0.5 tablets (12.5 mg total) by mouth 2 (two) times daily.   ticagrelor 90 MG Tabs tablet Commonly known as: BRILINTA Take 90 mg by mouth 2 (two) times daily.       Allergies: No Known Allergies  Family History: Family History  Problem Relation Age of Onset  . Cancer Mother        Lung, (surgically resected)  . Heart disease Mother   . Hearing loss Other        On Mom's side  . Hypertension Neg Hx   . Diabetes Neg Hx   . Colon cancer Neg Hx   . Colon polyps Neg Hx   . Stomach cancer Neg Hx   . Rectal cancer Neg Hx     Social  History:  reports that he quit smoking about 40 years ago. His smoking use included cigarettes. He has never used smokeless tobacco. He reports current alcohol use. He reports that he does not use drugs.   Physical Exam: BP 116/61   Pulse (!) 57   Ht 6\' 3"  (1.905 m)   Wt 204 lb (92.5 kg)   BMI 25.50 kg/m   Constitutional:  Alert and oriented, No acute distress. HEENT: [place] AT, moist mucus membranes.  Trachea midline, no masses. Cardiovascular: No clubbing, cyanosis, or edema. Respiratory: Normal respiratory effort, no increased work of breathing. Neurologic: Grossly intact, no  focal deficits, moving all 4 extremities. Psychiatric: Normal mood and affect.   Assessment & Plan:    1.  Nephrolithiasis  Punctate bilateral renal calculi seen on CT  KUB ordered to see if small calculi visualized and for any interval growth  Metabolic evaluation without significant abnormalities  If KUB today shows no significant abnormalities follow-up 1 year with KUB   , MD  [facility] [address] [place], [place] [phone]

## 2020-10-16 DIAGNOSIS — I6523 Occlusion and stenosis of bilateral carotid arteries: Secondary | ICD-10-CM | POA: Diagnosis not present

## 2020-10-16 DIAGNOSIS — I2510 Atherosclerotic heart disease of native coronary artery without angina pectoris: Secondary | ICD-10-CM | POA: Diagnosis not present

## 2020-10-16 DIAGNOSIS — R001 Bradycardia, unspecified: Secondary | ICD-10-CM | POA: Diagnosis not present

## 2020-10-16 DIAGNOSIS — I10 Essential (primary) hypertension: Secondary | ICD-10-CM | POA: Diagnosis not present

## 2020-10-16 DIAGNOSIS — E782 Mixed hyperlipidemia: Secondary | ICD-10-CM | POA: Diagnosis not present

## 2020-10-16 DIAGNOSIS — I25118 Atherosclerotic heart disease of native coronary artery with other forms of angina pectoris: Secondary | ICD-10-CM | POA: Diagnosis not present

## 2020-11-13 ENCOUNTER — Other Ambulatory Visit: Payer: Self-pay

## 2020-11-13 ENCOUNTER — Encounter: Payer: Self-pay | Admitting: Internal Medicine

## 2020-11-13 ENCOUNTER — Ambulatory Visit (INDEPENDENT_AMBULATORY_CARE_PROVIDER_SITE_OTHER): Payer: Medicare Other | Admitting: Internal Medicine

## 2020-11-13 DIAGNOSIS — M25511 Pain in right shoulder: Secondary | ICD-10-CM

## 2020-11-13 NOTE — Patient Instructions (Signed)
You can try over the counter diclofenac gel 3 times a day on your shoulder (for a short time like a week or 2).

## 2020-11-13 NOTE — Assessment & Plan Note (Signed)
Reassuring exam No internal derangement or evidence of rotator cuff damage Can continue tylenol Short term diclofenac topically

## 2020-11-13 NOTE — Progress Notes (Signed)
Subjective:    Patient ID: Philip Mack, male    DOB: [birth year], 70 y.o.   MRN: [id number]  HPI Here due to right shoulder pain This visit occurred during the SARS-CoV-2 public health emergency.  Safety protocols were in place, including screening questions prior to the visit, additional usage of staff PPE, and extensive cleaning of exam room while observing appropriate contact time as indicated for disinfecting solutions.   Has had some right shoulder pain that goes way back (like when he was working for [employer]) Seen by Dr Jeanne Ivan meloxicam in the past Has done exercises intermittently since then Then 1 week ago---heard a noise when he pulled shoulder down (trying to put on shorts and they got caught)  Very painful now Hard time sleeping Still doing exercises  ?slight help with ice/heat Tylenol may help some  Current Outpatient Medications on File Prior to Visit  Medication Sig Dispense Refill  . aspirin 81 MG chewable tablet Chew 1 tablet (81 mg total) by mouth daily.    . metoprolol tartrate (LOPRESSOR) 25 MG tablet Take 0.5 tablets (12.5 mg total) by mouth 2 (two) times daily. 30 tablet 0  . atorvastatin (LIPITOR) 80 MG tablet Take 80 mg by mouth every evening.      No current facility-administered medications on file prior to visit.    No Known Allergies  Past Medical History:  Diagnosis Date  . Cataract   . Hepatitis    Hep A?  . History of kidney stones   . Hyperlipidemia   . Myocardial infarction ([place])    10/12/19    Past Surgical History:  Procedure Laterality Date  . Arm injury  1975  . COLONOSCOPY    . CORONARY STENT INTERVENTION N/A 10/15/2019   Procedure: CORONARY STENT INTERVENTION;  Surgeon: Yolonda Kida, MD;  Location: [facility];  Service: Cardiovascular;  Laterality: N/A;  . CYSTOSCOPY/URETEROSCOPY/HOLMIUM LASER/STENT PLACEMENT Left 01/08/2020   Procedure: CYSTOSCOPY/URETEROSCOPY/HOLMIUM LASER/STENT PLACEMENT;  Surgeon:  Abbie Sons, MD;  Location: [facility];  Service: Urology;  Laterality: Left;  . EXTRACORPOREAL SHOCK WAVE LITHOTRIPSY Left 12/20/2019   Procedure: EXTRACORPOREAL SHOCK WAVE LITHOTRIPSY (ESWL);  Surgeon: Hollice Espy, MD;  Location: [facility];  Service: Urology;  Laterality: Left;  . HERNIA REPAIR  1959   Bilateral, inguinal  . LEFT HEART CATH AND CORONARY ANGIOGRAPHY N/A 10/15/2019   Procedure: LEFT HEART CATH AND CORONARY ANGIOGRAPHY;  Surgeon: Corey Skains, MD;  Location: [facility];  Service: Cardiovascular;  Laterality: N/A;  . POLYPECTOMY    . TONSILLECTOMY AND ADENOIDECTOMY  1961    Family History  Problem Relation Age of Onset  . Cancer Mother        Lung, (surgically resected)  . Heart disease Mother   . Hearing loss Other        On Mom's side  . Hypertension Neg Hx   . Diabetes Neg Hx   . Colon cancer Neg Hx   . Colon polyps Neg Hx   . Stomach cancer Neg Hx   . Rectal cancer Neg Hx     Social History   Socioeconomic History  . Marital status: Married    Spouse name: Not on file  . Number of children: 2  . Years of education: Not on file  . Highest education level: Not on file  Occupational History  . Occupation: Retired Human resources officer  . Smoking status: Former Smoker    Types: Cigarettes  Quit date: 08/30/1980    Years since quitting: 40.2  . Smokeless tobacco: Never Used  Vaping Use  . Vaping Use: Never used  Substance and Sexual Activity  . Alcohol use: Yes    Comment: Occasionally  . Drug use: No  . Sexual activity: Not on file  Other Topics Concern  . Not on file  Social History Narrative   Has living will   Wife is health care POA-- then son Randall Hiss   Would accept resuscitation   No tube feeds if cognitively unaware   Social Determinants of Health   Financial Resource Strain: Not on file  Food Insecurity: Not on file  Transportation Needs: Not on file  Physical Activity: Not on file  Stress: Not on file   Social Connections: Not on file  Intimate Partner Violence: Not on file   Review of Systems No other joint issues No swelling    Objective:   Physical Exam Constitutional:      Appearance: Normal appearance.  Musculoskeletal:     Comments: No right shoulder swelling No bursa tenderness ROM actively is fairly normal---slight pain with full external rotation Sight clunk at times but no crepitus  Neurological:     Mental Status: He is alert.            Assessment & Plan:

## 2021-06-14 IMAGING — CR DG CHEST 2V
2 series · 2 of 2 positions shown · non-contrast
Comparison: None.

CLINICAL DATA: Chest pain

EXAM:
CHEST - 2 VIEW

[chest pa]
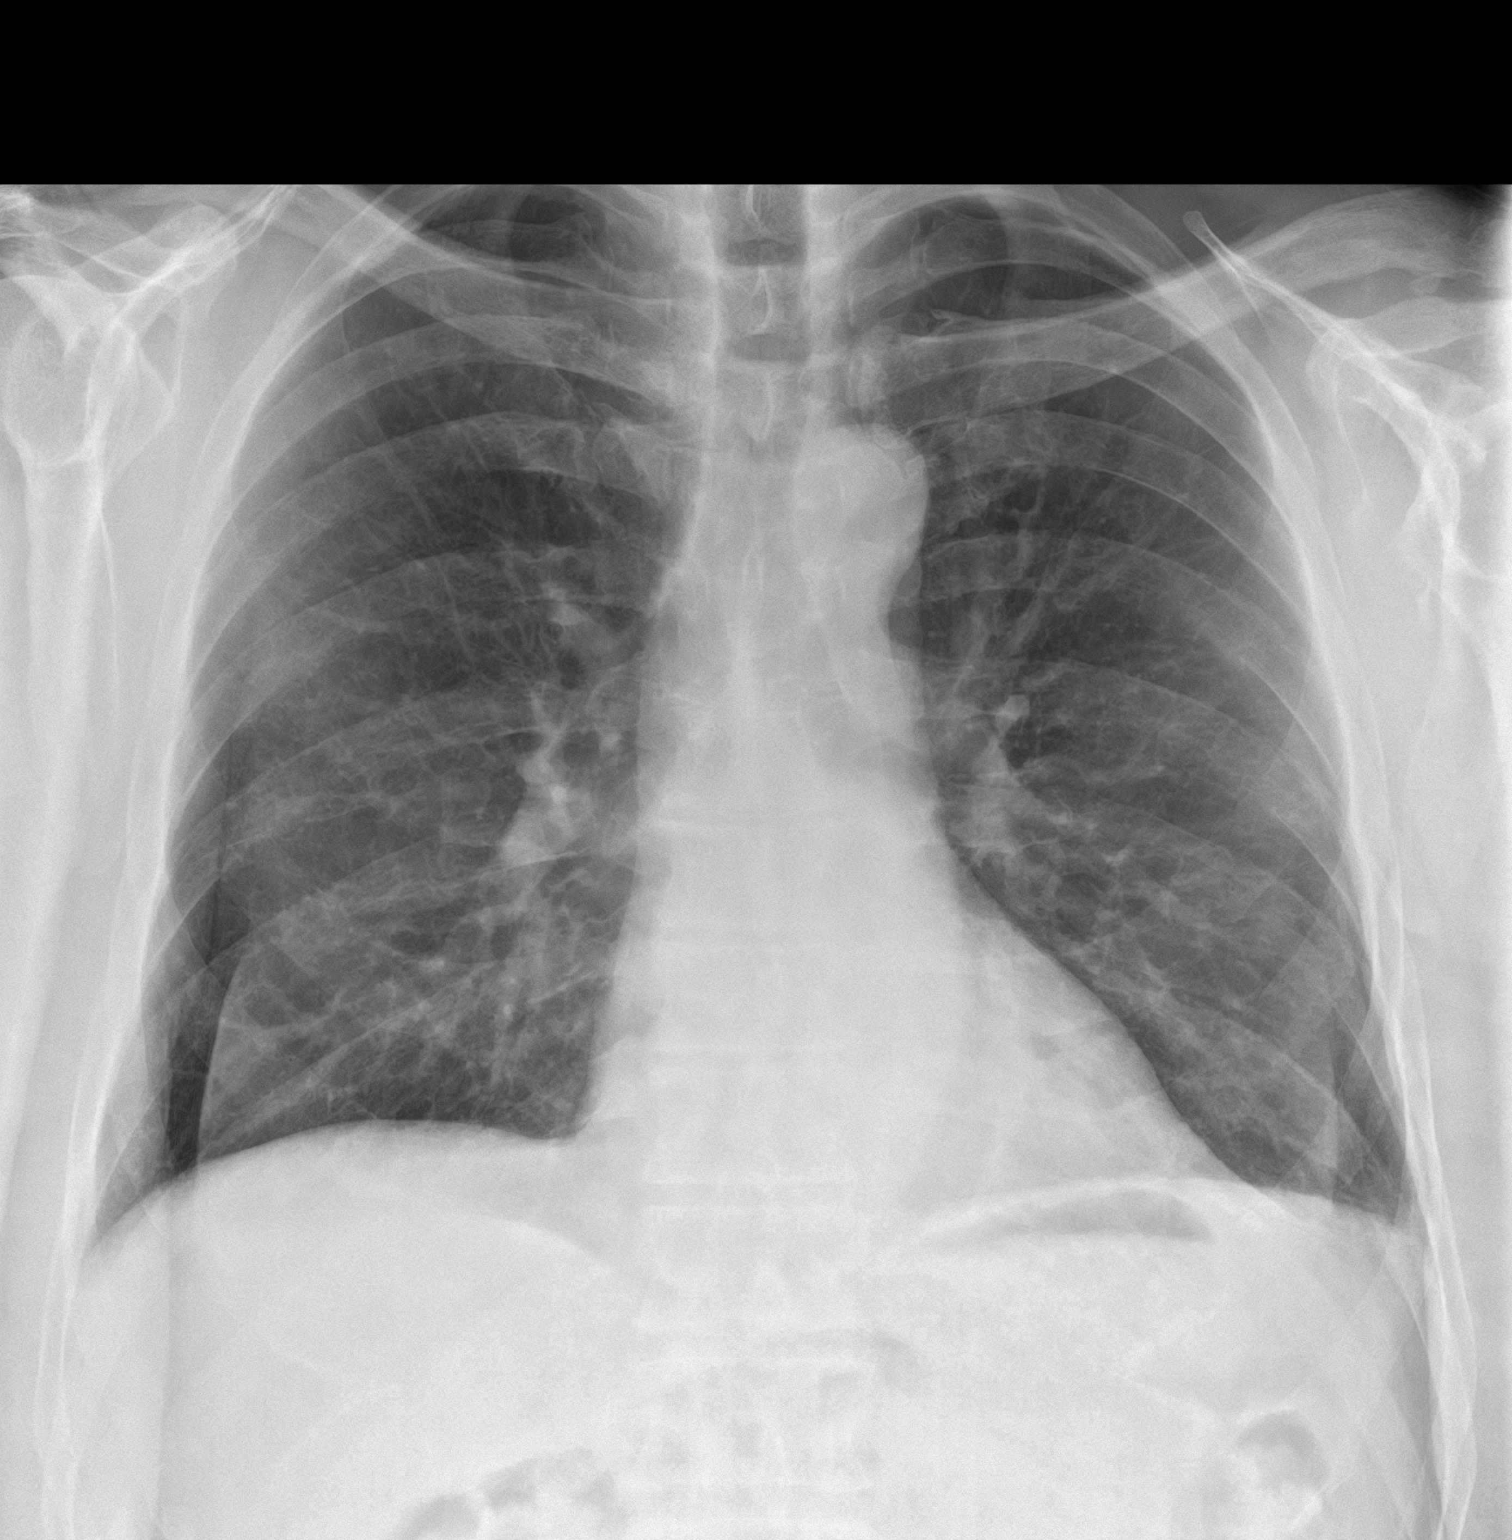

[chest lat]
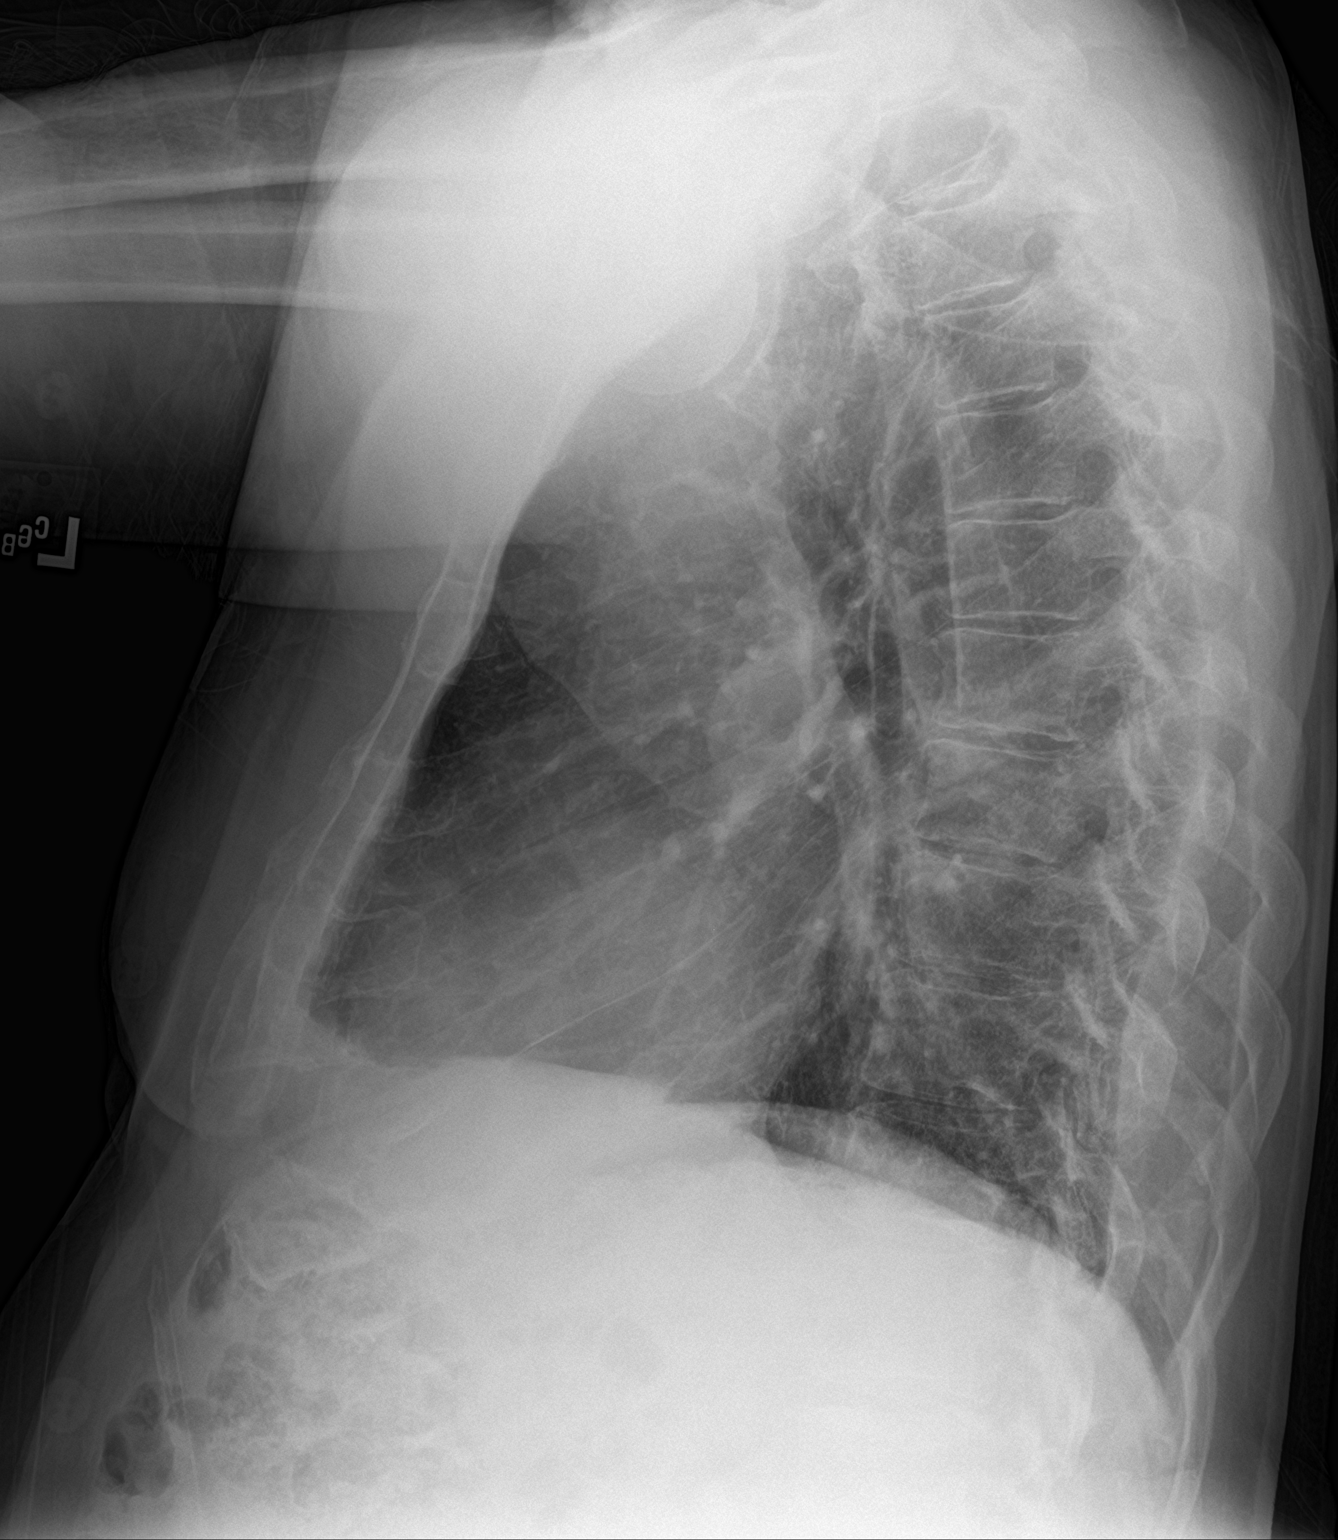

[2 of 2 positions shown; findings below may reference images not displayed]

FINDINGS: Heart is normal size. Lungs are clear. No effusion or pneumothorax.
Concern for left lateral rib fracture involving the left 6th rib.
Recommend correlation for pain in this area. This is age
indeterminate.
IMPRESSION: No acute cardiopulmonary disease.

Concern for left lateral 6th rib fracture, age indeterminate.
Recommend correlation for pain in this area.

## 2021-07-01 IMAGING — CR DG ABDOMEN 1V
1 series · 2 of 2 positions shown · non-contrast
Comparison: CT 10/23/2019.  KUB 10/24/2019.

CLINICAL DATA: Left side ureteral calculus

EXAM:
ABDOMEN - 1 VIEW

[Series 1: dg abd 1 view · 0.14mm/px · 2 of 2 slices shown]
[im 1/2]
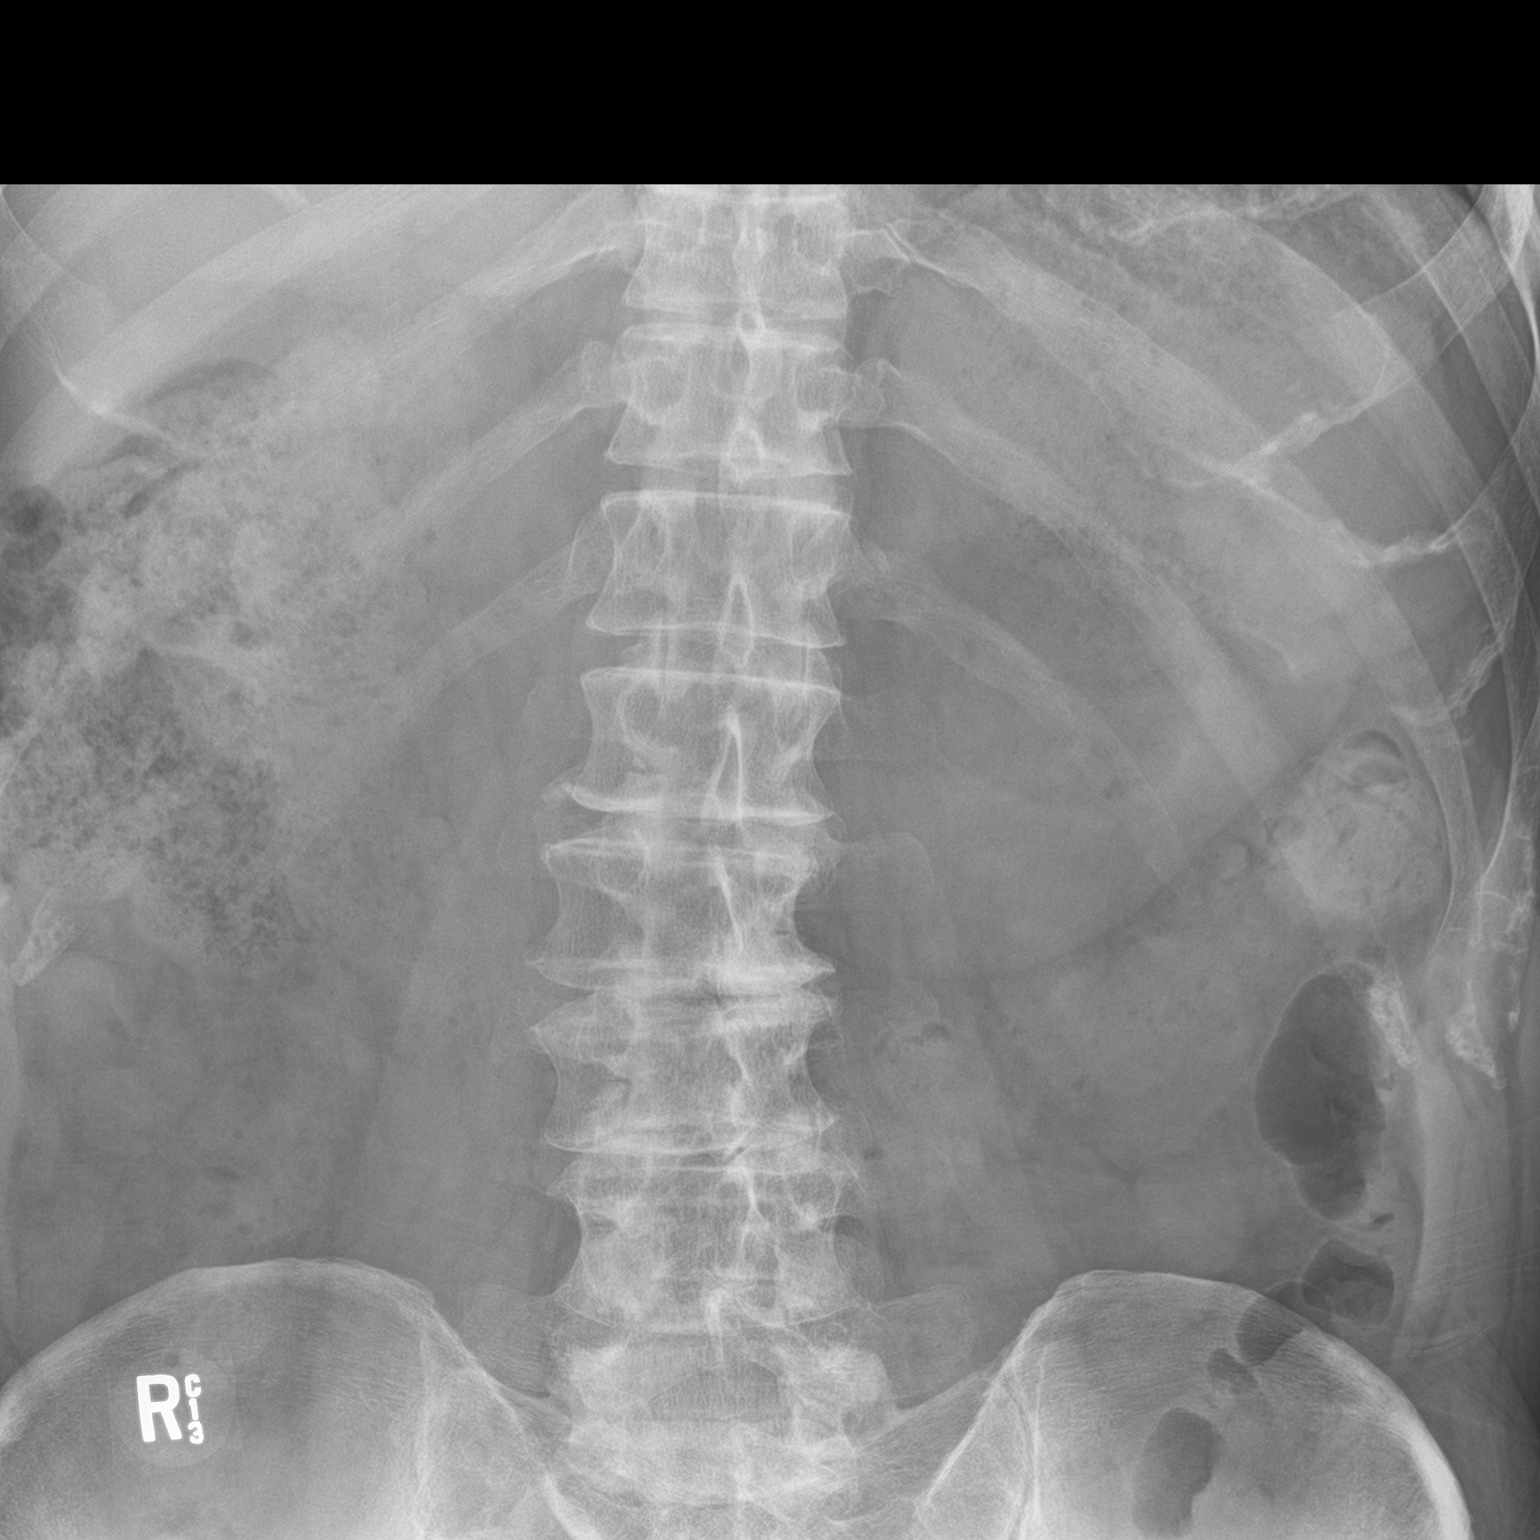
[im 2/2]
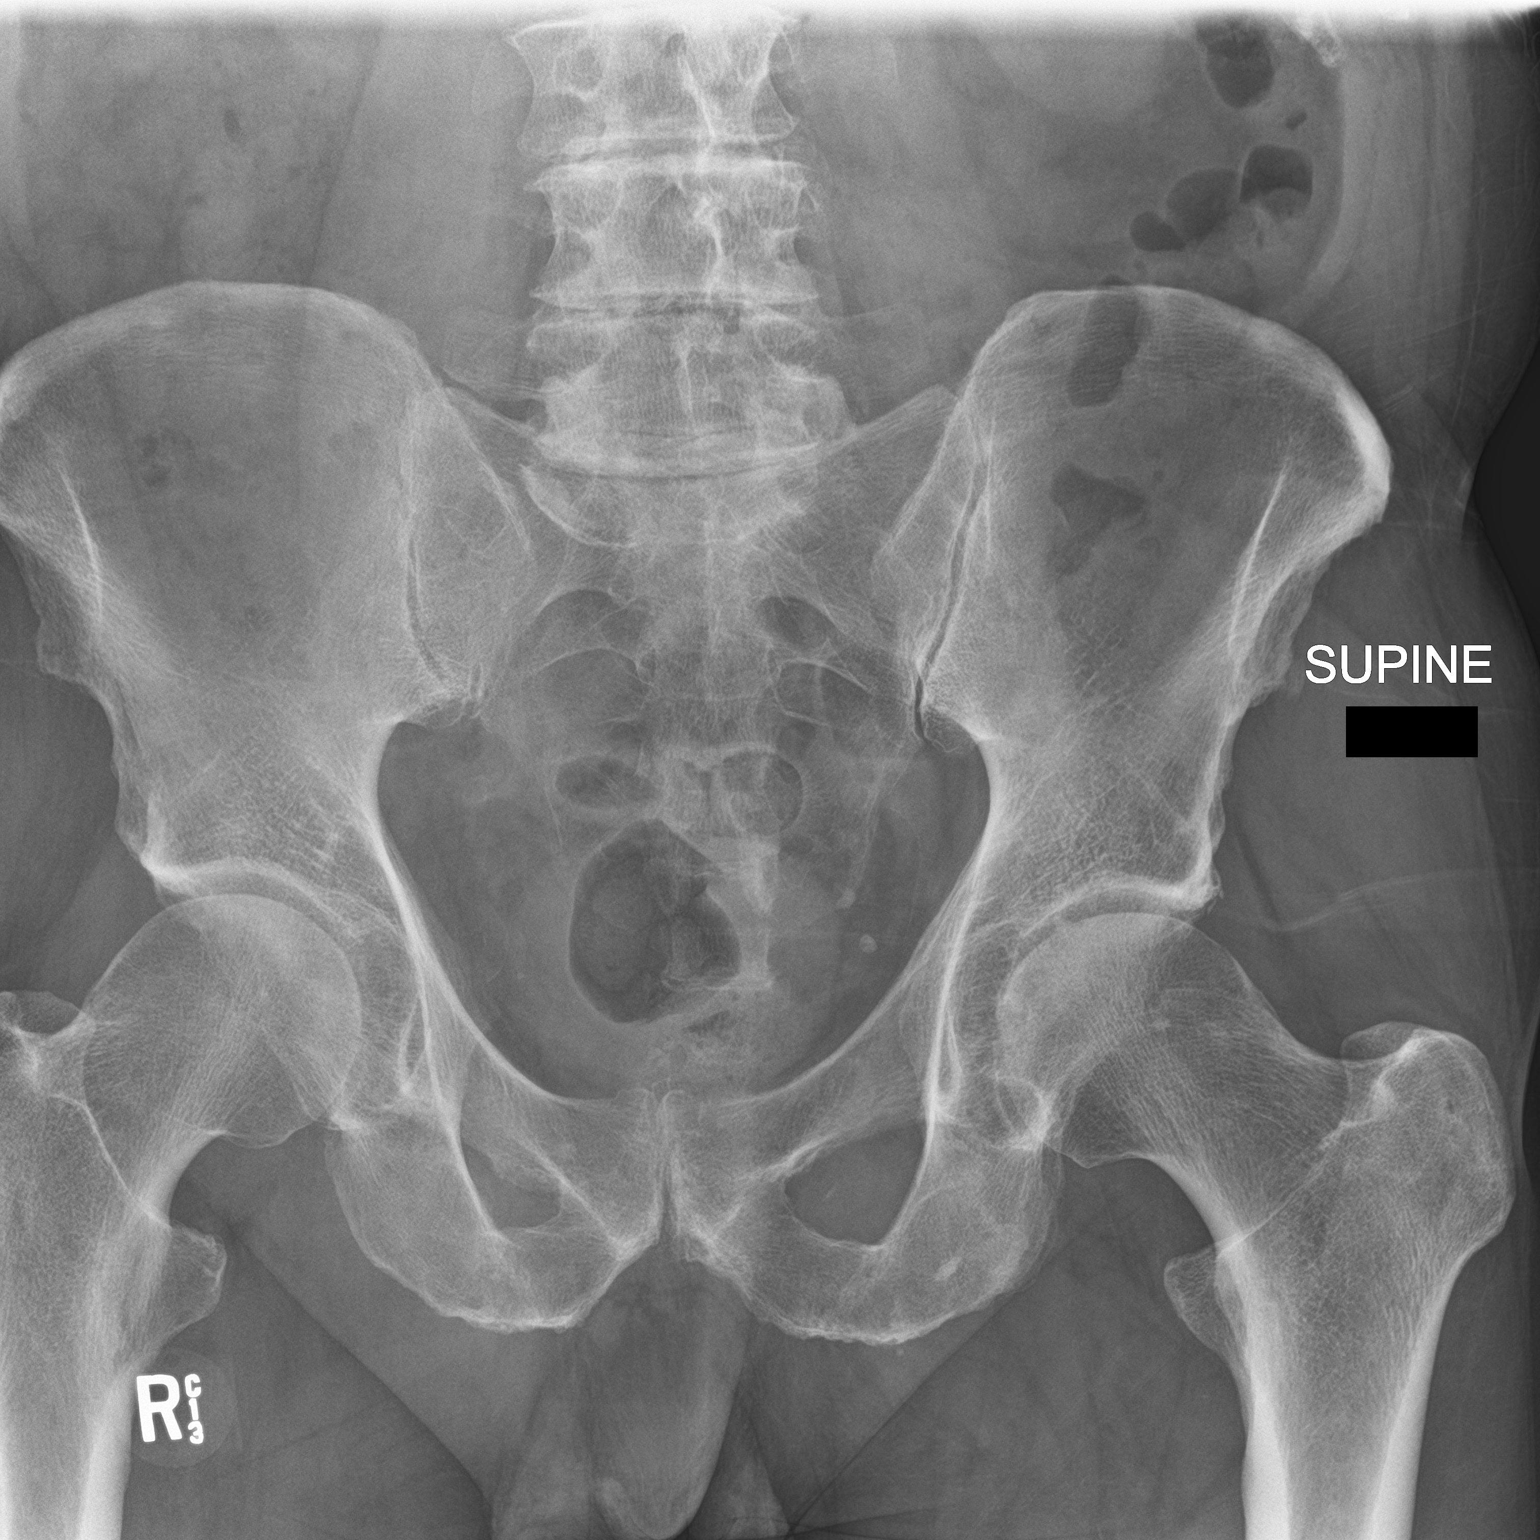

[2 of 2 positions shown; findings below may reference images not displayed]

FINDINGS: Distal left ureteral stone again noted, unchanged in position since
prior CT. No additional suspicious calcification. Calcified
phleboliths in the left lower pelvis. Nonobstructive bowel gas
pattern. No organomegaly or free air.
IMPRESSION: Distal left ureteral stone in stable position.

## 2021-07-16 DIAGNOSIS — I8393 Asymptomatic varicose veins of bilateral lower extremities: Secondary | ICD-10-CM | POA: Diagnosis not present

## 2021-07-16 DIAGNOSIS — L821 Other seborrheic keratosis: Secondary | ICD-10-CM | POA: Diagnosis not present

## 2021-07-16 DIAGNOSIS — L814 Other melanin hyperpigmentation: Secondary | ICD-10-CM | POA: Diagnosis not present

## 2021-07-16 DIAGNOSIS — L819 Disorder of pigmentation, unspecified: Secondary | ICD-10-CM | POA: Diagnosis not present

## 2021-07-16 DIAGNOSIS — D492 Neoplasm of unspecified behavior of bone, soft tissue, and skin: Secondary | ICD-10-CM | POA: Diagnosis not present

## 2021-07-16 DIAGNOSIS — D225 Melanocytic nevi of trunk: Secondary | ICD-10-CM | POA: Diagnosis not present

## 2021-07-16 DIAGNOSIS — L820 Inflamed seborrheic keratosis: Secondary | ICD-10-CM | POA: Diagnosis not present

## 2021-07-16 DIAGNOSIS — L570 Actinic keratosis: Secondary | ICD-10-CM | POA: Diagnosis not present

## 2021-07-30 DIAGNOSIS — R001 Bradycardia, unspecified: Secondary | ICD-10-CM | POA: Diagnosis not present

## 2021-07-30 DIAGNOSIS — I6523 Occlusion and stenosis of bilateral carotid arteries: Secondary | ICD-10-CM | POA: Diagnosis not present

## 2021-07-30 DIAGNOSIS — I10 Essential (primary) hypertension: Secondary | ICD-10-CM | POA: Diagnosis not present

## 2021-07-30 DIAGNOSIS — I2510 Atherosclerotic heart disease of native coronary artery without angina pectoris: Secondary | ICD-10-CM | POA: Diagnosis not present

## 2021-07-30 DIAGNOSIS — E782 Mixed hyperlipidemia: Secondary | ICD-10-CM | POA: Diagnosis not present

## 2021-08-07 ENCOUNTER — Encounter: Payer: Self-pay | Admitting: Internal Medicine

## 2021-08-07 ENCOUNTER — Other Ambulatory Visit: Payer: Self-pay

## 2021-08-07 ENCOUNTER — Ambulatory Visit (INDEPENDENT_AMBULATORY_CARE_PROVIDER_SITE_OTHER): Payer: Medicare Other | Admitting: Internal Medicine

## 2021-08-07 VITALS — BP 100/60 | HR 61 | Temp 97.7°F | Ht 75.0 in | Wt 202.0 lb

## 2021-08-07 DIAGNOSIS — I2510 Atherosclerotic heart disease of native coronary artery without angina pectoris: Secondary | ICD-10-CM | POA: Diagnosis not present

## 2021-08-07 DIAGNOSIS — N200 Calculus of kidney: Secondary | ICD-10-CM

## 2021-08-07 DIAGNOSIS — Z125 Encounter for screening for malignant neoplasm of prostate: Secondary | ICD-10-CM | POA: Diagnosis not present

## 2021-08-07 DIAGNOSIS — Z0000 Encounter for general adult medical examination without abnormal findings: Secondary | ICD-10-CM | POA: Diagnosis not present

## 2021-08-07 DIAGNOSIS — E785 Hyperlipidemia, unspecified: Secondary | ICD-10-CM | POA: Diagnosis not present

## 2021-08-07 LAB — CBC
HCT: 43 % (ref 39.0–52.0)
Hemoglobin: 14.3 g/dL (ref 13.0–17.0)
MCHC: 33.3 g/dL (ref 30.0–36.0)
MCV: 91.9 fl (ref 78.0–100.0)
Platelets: 174 10*3/uL (ref 150.0–400.0)
RBC: 4.68 Mil/uL (ref 4.22–5.81)
RDW: 13.7 % (ref 11.5–15.5)
WBC: 5.4 10*3/uL (ref 4.0–10.5)

## 2021-08-07 LAB — COMPREHENSIVE METABOLIC PANEL
ALT: 28 U/L (ref 0–53)
AST: 40 U/L — ABNORMAL HIGH (ref 0–37)
Albumin: 4.3 g/dL (ref 3.5–5.2)
Alkaline Phosphatase: 57 U/L (ref 39–117)
BUN: 14 mg/dL (ref 6–23)
CO2: 28 mEq/L (ref 19–32)
Calcium: 9.5 mg/dL (ref 8.4–10.5)
Chloride: 102 mEq/L (ref 96–112)
Creatinine, Ser: 0.87 mg/dL (ref 0.40–1.50)
GFR: 87.67 mL/min (ref >60.00)
Glucose, Bld: 85 mg/dL (ref 70–99)
Potassium: 4.4 mEq/L (ref 3.5–5.1)
Sodium: 137 mEq/L (ref 135–145)
Total Bilirubin: 1.3 mg/dL — ABNORMAL HIGH (ref 0.2–1.2)
Total Protein: 6.7 g/dL (ref 6.0–8.3)

## 2021-08-07 LAB — PSA, MEDICARE: PSA: 0.25 ng/ml (ref 0.10–4.00)

## 2021-08-07 LAB — LIPID PANEL
Cholesterol: 131 mg/dL (ref 0–200)
HDL: 64.4 mg/dL (ref >39.00)
LDL Cholesterol: 58 mg/dL (ref 0–99)
NonHDL: 66.73
Total CHOL/HDL Ratio: 2
Triglycerides: 45 mg/dL (ref 0.0–149.0)
VLDL: 9 mg/dL (ref 0.0–40.0)

## 2021-08-07 NOTE — Progress Notes (Signed)
Hearing Screening - Comments:: Passed whisper test Vision Screening - Comments:: July 2022  

## 2021-08-07 NOTE — Assessment & Plan Note (Signed)
I have personally reviewed the Medicare Annual Wellness questionnaire and have noted 1. The patient's medical and social history 2. Their use of alcohol, tobacco or illicit drugs 3. Their current medications and supplements 4. The patient's functional ability including ADL's, fall risks, home safety risks and hearing or visual             impairment. 5. Diet and physical activities 6. Evidence for depression or mood disorders  The patients weight, height, BMI and visual acuity have been recorded in the chart I have made referrals, counseling and provided education to the patient based review of the above and I have provided the pt with a written personalized care plan for preventive services.  I have provided you with a copy of your personalized plan for preventive services. Please take the time to review along with your updated medication list.  Exercises regularly Has had bivalent COVID and flu vaccines Colon due 2025 Will check one last PSA

## 2021-08-07 NOTE — Assessment & Plan Note (Signed)
Calcium oxalate Discussed hydration and citrate (citrus)

## 2021-08-07 NOTE — Assessment & Plan Note (Signed)
No symptoms Recent cardiology check up On ASA, metoprolol, atorvastatin

## 2021-08-07 NOTE — Progress Notes (Signed)
Subjective:    Patient ID: [name], male    DOB: 11-21-1950, 70 y.o.   MRN: [id number]  HPI Here for Medicare wellness visit and follow up of chronic health conditions Reviewed advanced directives Reviewed other doctors--Dr Lupton--derm, Dr Kowalski---cardiology, Dr Stoioff--urology, Dr Clydene Pugh, Dr Quintella Reichert, Dr Byerly--periodontist No hospitalizations or surgery this year Vision is fine No problems with hearing Occasional beer No tobacco Exercises regularly Volunteers at [facility] No falls No depression or anhedonia Independent with instrumental ADLs No sig memory problems  Doing fine Had recent excision of skin lesions--awaiting pathology  Heart is fine No chest pain or SOB No dizziness or syncope No edema No palpitations  No recurrence of kidney stones Has follow up planned  Does have some foot pain Shoulder is better--got some massage from PT at [facility]---it helped  Current Outpatient Medications on File Prior to Visit  Medication Sig Dispense Refill   aspirin 81 MG chewable tablet Chew 1 tablet (81 mg total) by mouth daily.     metoprolol tartrate (LOPRESSOR) 25 MG tablet Take 0.5 tablets (12.5 mg total) by mouth 2 (two) times daily. 30 tablet 0   atorvastatin (LIPITOR) 80 MG tablet Take 80 mg by mouth every evening.      No current facility-administered medications on file prior to visit.    No Known Allergies  Past Medical History:  Diagnosis Date   Cataract    Hepatitis    Hep A?   History of kidney stones    Hyperlipidemia    Myocardial infarction ([place])    10/12/19    Past Surgical History:  Procedure Laterality Date   Arm injury  1975   COLONOSCOPY     CORONARY STENT INTERVENTION N/A 10/15/2019   Procedure: CORONARY STENT INTERVENTION;  Surgeon: Yolonda Kida, MD;  Location: [facility];  Service: Cardiovascular;  Laterality: N/A;   CYSTOSCOPY/URETEROSCOPY/HOLMIUM LASER/STENT PLACEMENT Left 01/08/2020    Procedure: CYSTOSCOPY/URETEROSCOPY/HOLMIUM LASER/STENT PLACEMENT;  Surgeon: Abbie Sons, MD;  Location: [facility];  Service: Urology;  Laterality: Left;   EXTRACORPOREAL SHOCK WAVE LITHOTRIPSY Left 12/20/2019   Procedure: EXTRACORPOREAL SHOCK WAVE LITHOTRIPSY (ESWL);  Surgeon: Hollice Espy, MD;  Location: [facility];  Service: Urology;  Laterality: Left;   HERNIA REPAIR  1959   Bilateral, inguinal   LEFT HEART CATH AND CORONARY ANGIOGRAPHY N/A 10/15/2019   Procedure: LEFT HEART CATH AND CORONARY ANGIOGRAPHY;  Surgeon: Corey Skains, MD;  Location: [facility];  Service: Cardiovascular;  Laterality: N/A;   POLYPECTOMY     TONSILLECTOMY AND ADENOIDECTOMY  1961    Family History  Problem Relation Age of Onset   Cancer Mother        Lung, (surgically resected)   Heart disease Mother    Hearing loss Other        On Mom's side   Hypertension Neg Hx    Diabetes Neg Hx    Colon cancer Neg Hx    Colon polyps Neg Hx    Stomach cancer Neg Hx    Rectal cancer Neg Hx     Social History   Socioeconomic History   Marital status: Married    Spouse name: Not on file   Number of children: 2   Years of education: Not on file   Highest education level: Not on file  Occupational History   Occupation: Retired Psychologist, sport and exercise  Tobacco Use   Smoking status: Former    Types: Cigarettes    Quit date: 08/30/1980  Years since quitting: 40.9   Smokeless tobacco: Never  Vaping Use   Vaping Use: Never used  Substance and Sexual Activity   Alcohol use: Yes    Comment: Occasionally   Drug use: No   Sexual activity: Not on file  Other Topics Concern   Not on file  Social History Narrative   Has living will   Wife is health care POA-- then son Randall Hiss   Would accept resuscitation   No tube feeds if cognitively unaware   Social Determinants of Health   Financial Resource Strain: Not on file  Food Insecurity: Not on file  Transportation Needs: Not on file  Physical Activity:  Not on file  Stress: Not on file  Social Connections: Not on file  Intimate Partner Violence: Not on file   Review of Systems Appetite is good Weight fairly stable Sleeps well Wears seat belt Teeth fine---sees dentist No further suspicious skin lesions No heartburn or dysphagia Bowels move fine--no blood Voiding with good stream--no dribbling. Nocturia x 1 mostly    Objective:   Physical Exam Constitutional:      Appearance: Normal appearance.  HENT:     Mouth/Throat:     Comments: No lesions Eyes:     Conjunctiva/sclera: Conjunctivae normal.     Pupils: Pupils are equal, round, and reactive to light.  Cardiovascular:     Rate and Rhythm: Normal rate and regular rhythm.     Pulses: Normal pulses.     Heart sounds: No murmur heard.   No gallop.  Pulmonary:     Effort: Pulmonary effort is normal.     Breath sounds: Normal breath sounds. No wheezing or rales.  Abdominal:     Palpations: Abdomen is soft.     Tenderness: There is no abdominal tenderness.  Musculoskeletal:     Cervical back: Neck supple.     Right lower leg: No edema.     Left lower leg: No edema.  Lymphadenopathy:     Cervical: No cervical adenopathy.  Skin:    Findings: No lesion or rash.  Neurological:     Mental Status: He is alert and oriented to person, place, and time.     Comments: President---"Joe Balinda Quails Obama" [phone] [id number] Recall 3/3  Psychiatric:        Mood and Affect: Mood normal.        Behavior: Behavior normal.           Assessment & Plan:

## 2021-08-07 NOTE — Assessment & Plan Note (Signed)
On high dose statin.

## 2021-08-13 DIAGNOSIS — L570 Actinic keratosis: Secondary | ICD-10-CM | POA: Diagnosis not present

## 2021-08-13 DIAGNOSIS — D0471 Carcinoma in situ of skin of right lower limb, including hip: Secondary | ICD-10-CM | POA: Diagnosis not present

## 2021-08-21 ENCOUNTER — Ambulatory Visit: Payer: Self-pay | Admitting: Urology

## 2021-08-22 IMAGING — CR DG ABDOMEN 1V
1 series · 2 of 2 positions shown · non-contrast
Comparison: Abdominal radiograph 12/13/2019. CT the abdomen and
pelvis 10/23/2019.

CLINICAL DATA: 68-year-old male with history of left ureteral
stone. Lithotripsy planned.

EXAM:
ABDOMEN - 1 VIEW

[Series 1: dg abd 1 view · 0.14mm/px · 2 of 2 slices shown]
[im 1/2]
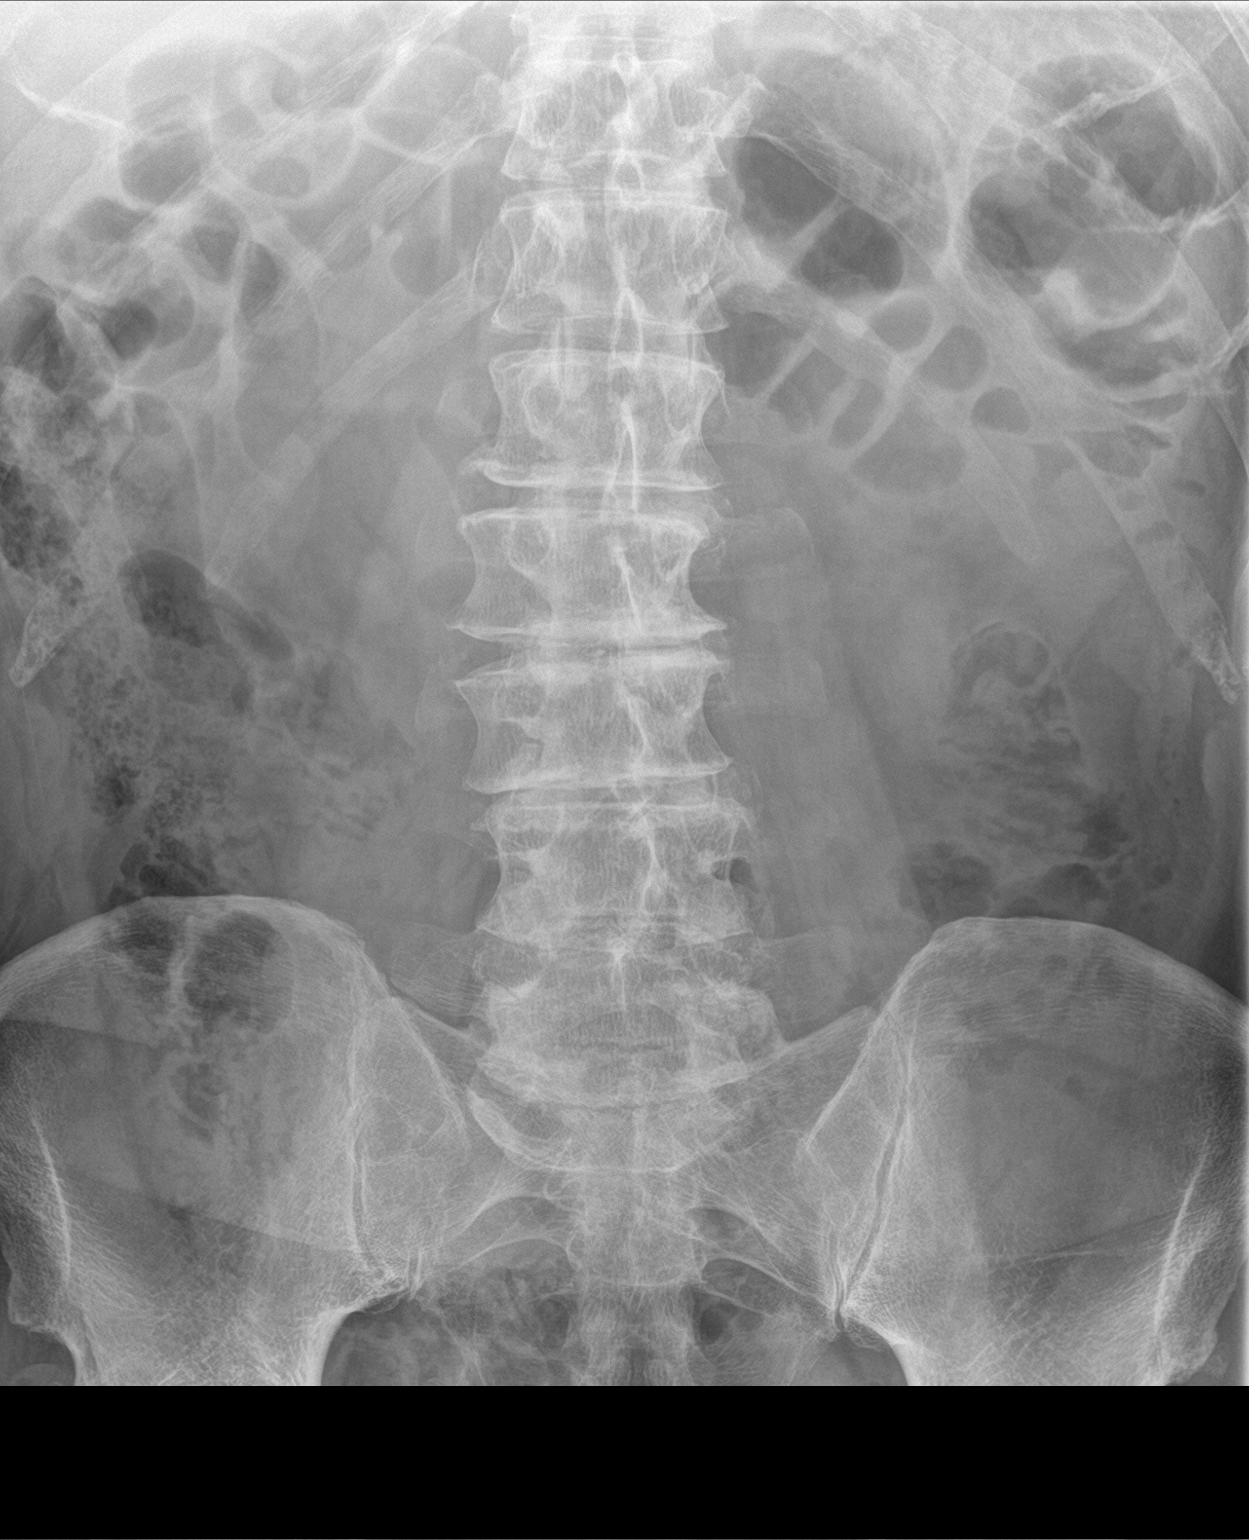
[im 2/2]
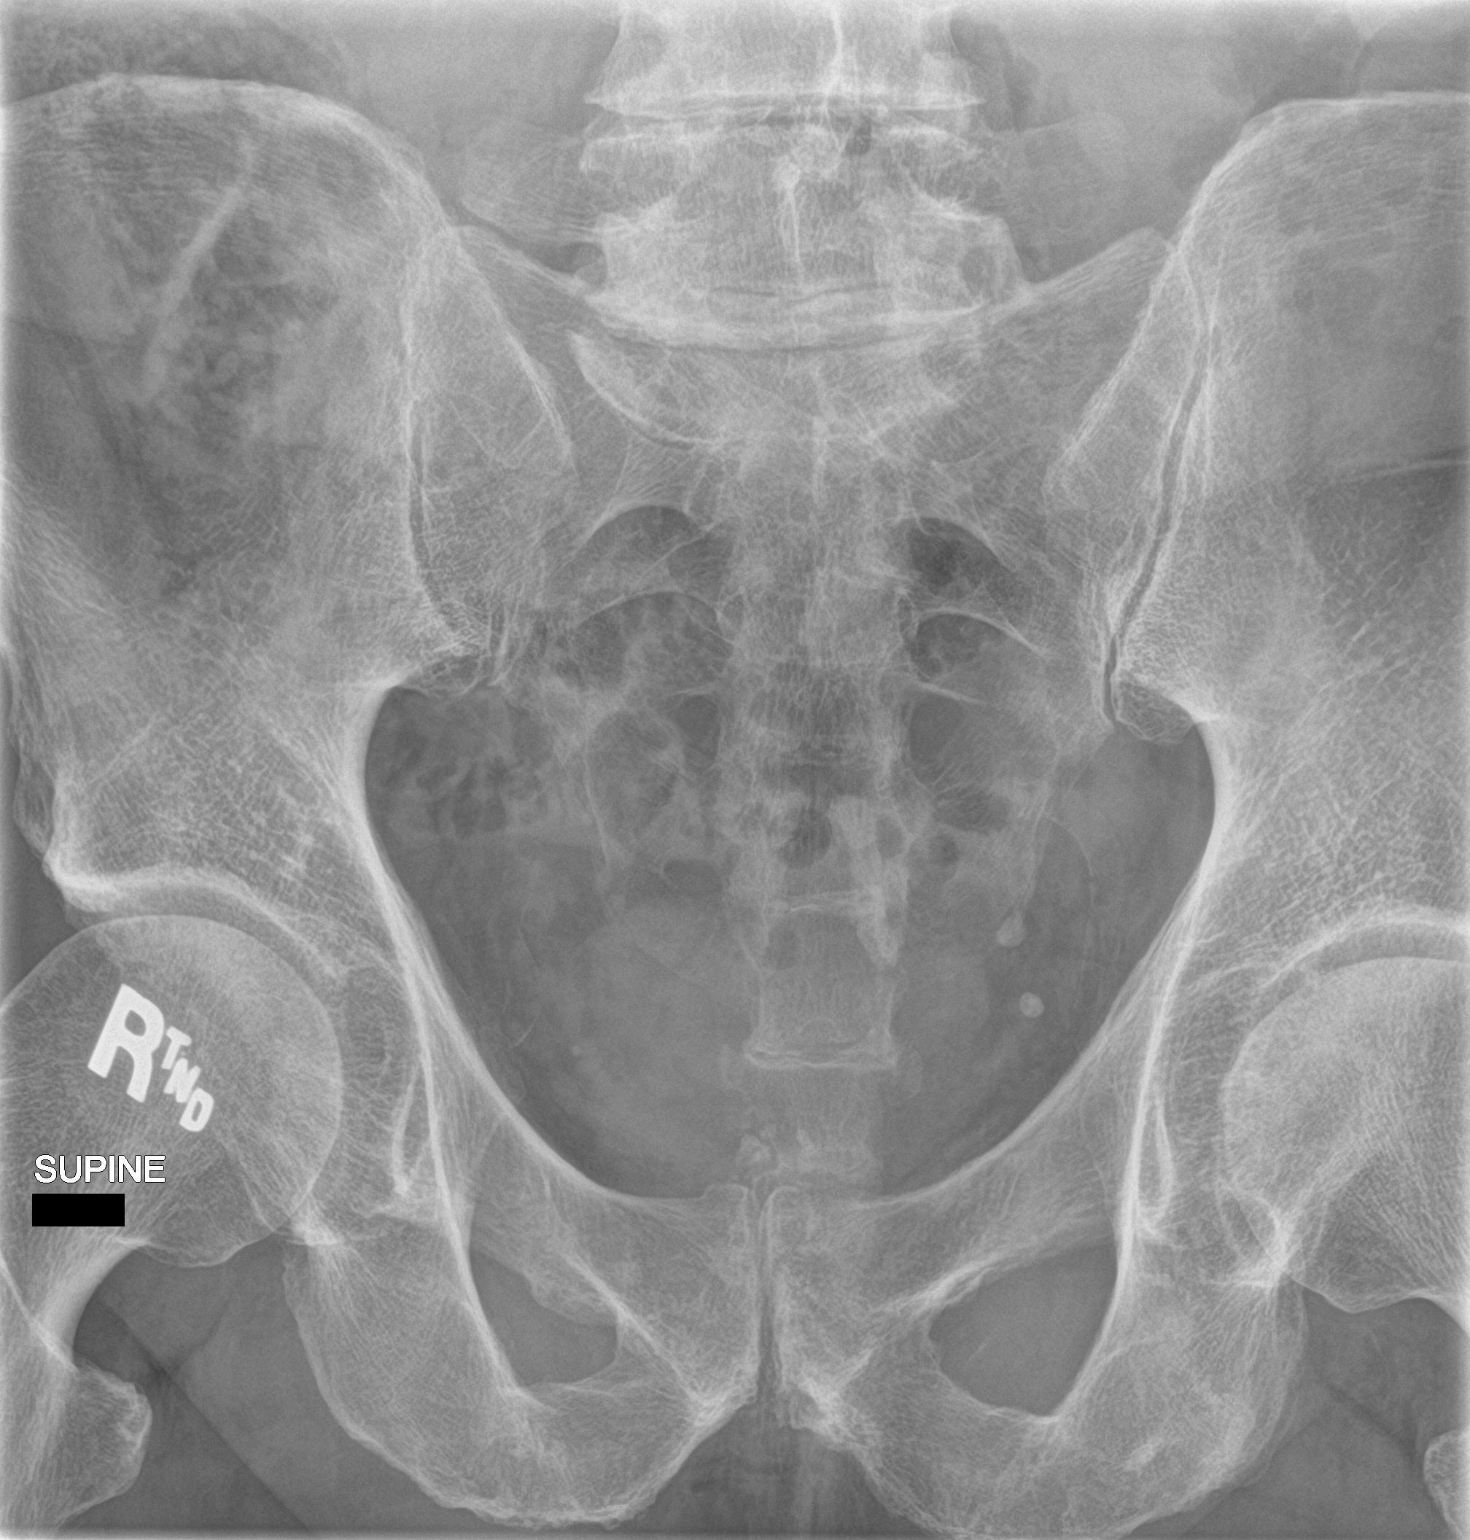

[2 of 2 positions shown; findings below may reference images not displayed]

FINDINGS: 6 mm calcification projecting over the distal third of the left
ureter, similar to the prior study. Pelvic phleboliths. Gas and
stool are seen scattered throughout the colon extending to the level
of the distal rectum. No pathologic distension of small bowel is
noted. No gross evidence of pneumoperitoneum.
IMPRESSION: 1. 6 mm calcification projecting over the distal third of the left
ureter, similar to the prior study.
2. Nonobstructive bowel gas pattern.

## 2021-08-27 ENCOUNTER — Other Ambulatory Visit: Payer: Self-pay | Admitting: *Deleted

## 2021-08-27 DIAGNOSIS — N200 Calculus of kidney: Secondary | ICD-10-CM

## 2021-08-28 ENCOUNTER — Ambulatory Visit
Admission: RE | Admit: 2021-08-28 | Discharge: 2021-08-28 | Disposition: A | Discharge status: Home / Self Care | Payer: Medicare Other | Source: Ambulatory Visit | Attending: Urology | Admitting: Urology

## 2021-08-28 ENCOUNTER — Encounter: Payer: Self-pay | Admitting: Urology

## 2021-08-28 ENCOUNTER — Other Ambulatory Visit: Payer: Self-pay

## 2021-08-28 ENCOUNTER — Ambulatory Visit
Admission: RE | Admit: 2021-08-28 | Discharge: 2021-08-28 | Disposition: A | Discharge status: Home / Self Care | Payer: Medicare Other | Attending: Urology | Admitting: Urology

## 2021-08-28 ENCOUNTER — Ambulatory Visit (INDEPENDENT_AMBULATORY_CARE_PROVIDER_SITE_OTHER): Payer: Medicare Other | Admitting: Urology

## 2021-08-28 VITALS — BP 133/64 | HR 53 | Ht 75.0 in | Wt 200.0 lb

## 2021-08-28 DIAGNOSIS — Z87442 Personal history of urinary calculi: Secondary | ICD-10-CM | POA: Diagnosis not present

## 2021-08-28 DIAGNOSIS — N200 Calculus of kidney: Secondary | ICD-10-CM | POA: Diagnosis not present

## 2021-08-28 NOTE — Progress Notes (Signed)
08/28/2021 11:46 AM   Philip Mack May 22, 1951 [id number]  Referring provider: Venia Carbon, MD [address] [place],  [place] [address]  Chief Complaint  Patient presents with   Nephrolithiasis    Urologic history:  1.  Nephrolithiasis -Episode renal colic 10/2120; 7 mm left distal ureteral calculus -Initial shockwave lithotripsy without significant fragmentation -Subsequent ureteroscopic removal 12/2019 -Treatment delayed due to recent MI and cardiology clearance -CT with punctate bilateral renal calculi -Stone analysis 100% calcium oxalate monohydrate -Metabolic evaluation mild uric acid elevation; uric acid SS was normal   2.  Peyronie's disease   HPI: 70 y.o. male presents for annual follow-up.  Doing well since last visit No bothersome LUTS Denies dysuria, gross hematuria Denies flank, abdominal or pelvic pain PSA 08/07/2021 0.25   PMH: Past Medical History:  Diagnosis Date   Cataract    Hepatitis    Hep A?   History of kidney stones    Hyperlipidemia    Myocardial infarction [facility])    10/12/19    Surgical History: Past Surgical History:  Procedure Laterality Date   Arm injury  1975   COLONOSCOPY     CORONARY STENT INTERVENTION N/A 10/15/2019   Procedure: CORONARY STENT INTERVENTION;  Surgeon: Yolonda Kida, MD;  Location: [facility];  Service: Cardiovascular;  Laterality: N/A;   CYSTOSCOPY/URETEROSCOPY/HOLMIUM LASER/STENT PLACEMENT Left 01/08/2020   Procedure: CYSTOSCOPY/URETEROSCOPY/HOLMIUM LASER/STENT PLACEMENT;  Surgeon: Abbie Sons, MD;  Location: [facility];  Service: Urology;  Laterality: Left;   EXTRACORPOREAL SHOCK WAVE LITHOTRIPSY Left 12/20/2019   Procedure: EXTRACORPOREAL SHOCK WAVE LITHOTRIPSY (ESWL);  Surgeon: Hollice Espy, MD;  Location: [facility];  Service: Urology;  Laterality: Left;   HERNIA REPAIR  1959   Bilateral, inguinal   LEFT HEART CATH AND CORONARY ANGIOGRAPHY N/A 10/15/2019   Procedure: LEFT  HEART CATH AND CORONARY ANGIOGRAPHY;  Surgeon: Corey Skains, MD;  Location: [facility];  Service: Cardiovascular;  Laterality: N/A;   POLYPECTOMY     TONSILLECTOMY AND ADENOIDECTOMY  1961    Home Medications:  Allergies as of 08/28/2021   No Known Allergies      Medication List        Accurate as of August 28, 2021 11:46 AM. If you have any questions, ask your nurse or doctor.          aspirin 81 MG chewable tablet Chew 1 tablet (81 mg total) by mouth daily.   atorvastatin 80 MG tablet Commonly known as: LIPITOR Take 80 mg by mouth every evening.   metoprolol tartrate 25 MG tablet Commonly known as: LOPRESSOR Take 0.5 tablets (12.5 mg total) by mouth 2 (two) times daily.        Allergies: No Known Allergies  Family History: Family History  Problem Relation Age of Onset   Cancer Mother        Lung, (surgically resected)   Heart disease Mother    Hearing loss Other        On Mom's side   Hypertension Neg Hx    Diabetes Neg Hx    Colon cancer Neg Hx    Colon polyps Neg Hx    Stomach cancer Neg Hx    Rectal cancer Neg Hx     Social History:  reports that he quit smoking about 41 years ago. His smoking use included cigarettes. He has never used smokeless tobacco. He reports current alcohol use. He reports that he does not use drugs.   Physical Exam:  BP 133/64    Pulse (!) 53    Ht 6\' 3"  (1.905 m)    Wt 200 lb (90.7 kg)    BMI 25.00 kg/m   Constitutional:  Alert and oriented, No acute distress. HEENT: [place] AT, moist mucus membranes.  Trachea midline, no masses. Cardiovascular: No clubbing, cyanosis, or edema. Respiratory: Normal respiratory effort, no increased work of breathing. Psychiatric: Normal mood and affect.   Pertinent Imaging: KUB images performed today were personally reviewed and interpreted.  No calcifications are identified overlying the renal outlines.  Moderate mount of overlying stool and bowel gas present    Assessment  & Plan:    1.  Nephrolithiasis Punctate bilateral renal calculi seen on prior CT Not visualized on KUB today Asymptomatic Continue annual follow-up; call as needed for recurrent flank pain or renal colic   Abbie Sons, MD  [facility] [address], [place], [place] [address] [phone]

## 2021-08-31 ENCOUNTER — Encounter: Payer: Self-pay | Admitting: Urology

## 2021-09-07 ENCOUNTER — Ambulatory Visit: Payer: Federal, State, Local not specified - PPO | Admitting: Urology

## 2021-09-14 IMAGING — US US RENAL
1 series · 14 of 25 positions shown · non-contrast
Comparison: CT 10/23/2019

CLINICAL DATA: Gross hematuria recent lithotripsy

EXAM:
RENAL / URINARY TRACT ULTRASOUND COMPLETE

[Series 1: us renal · 14 of 64 slices shown]
[im 1/64]
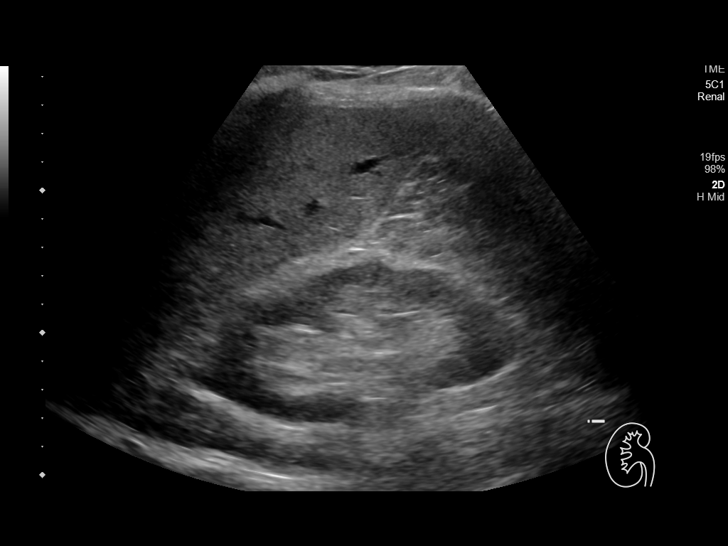
[im 6/64]
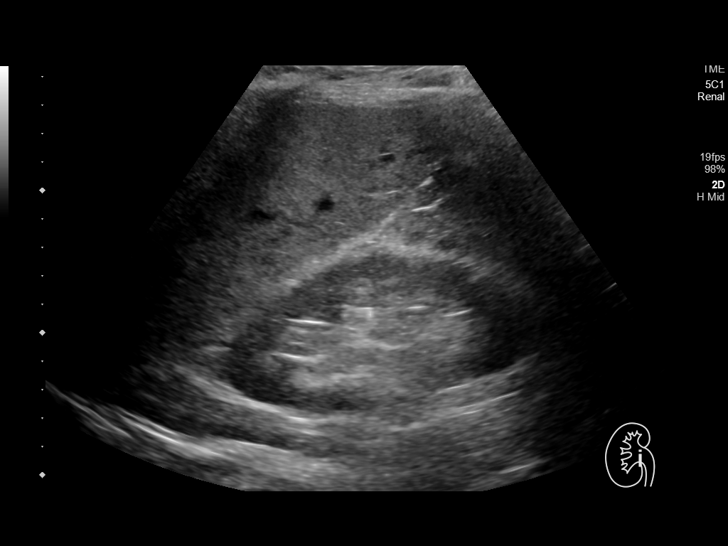
[im 11/64]
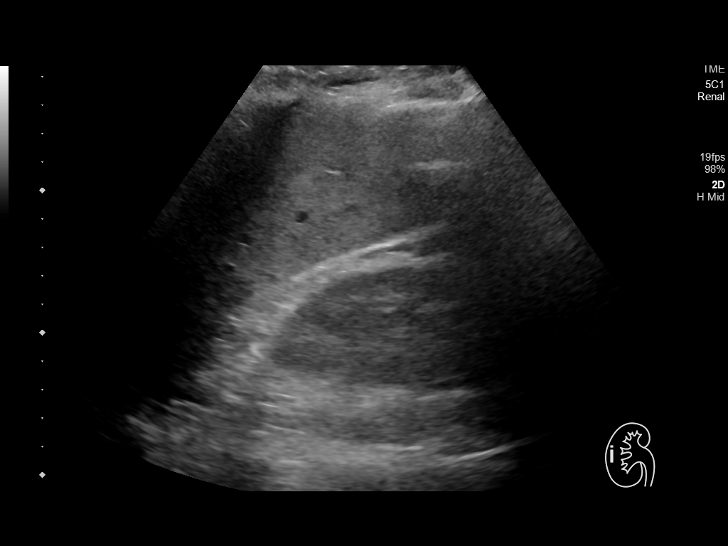
[im 16/64]
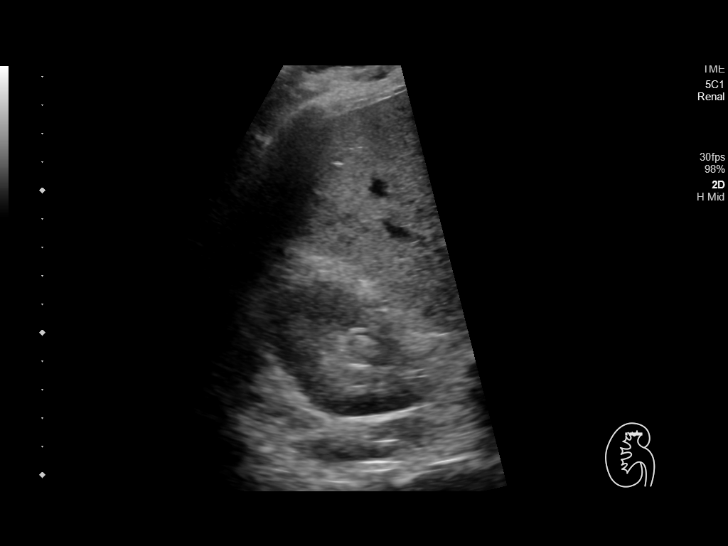
[im 22/64]
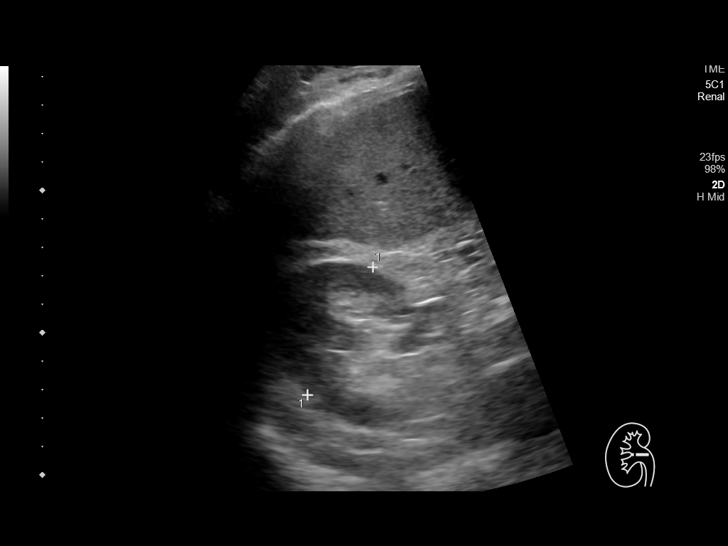
[im 24/64]
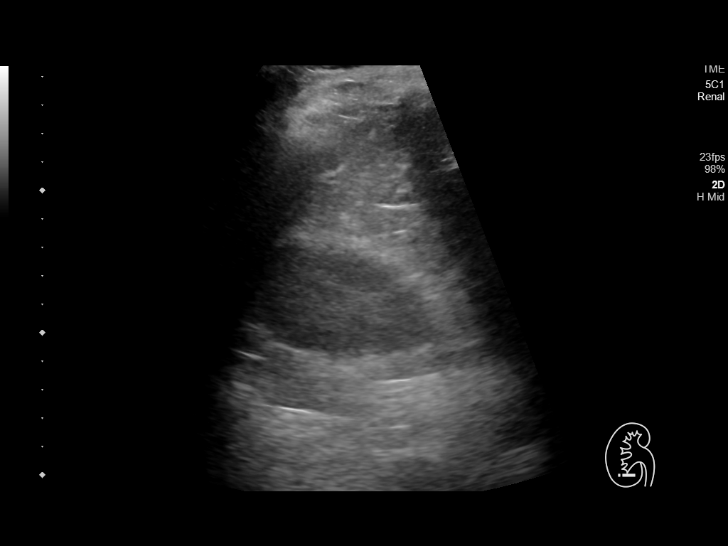
[im 29/64]
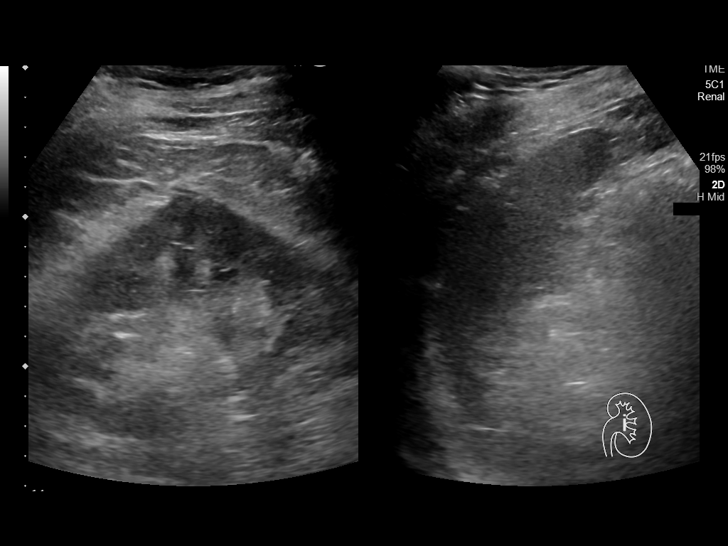
[im 35/64]
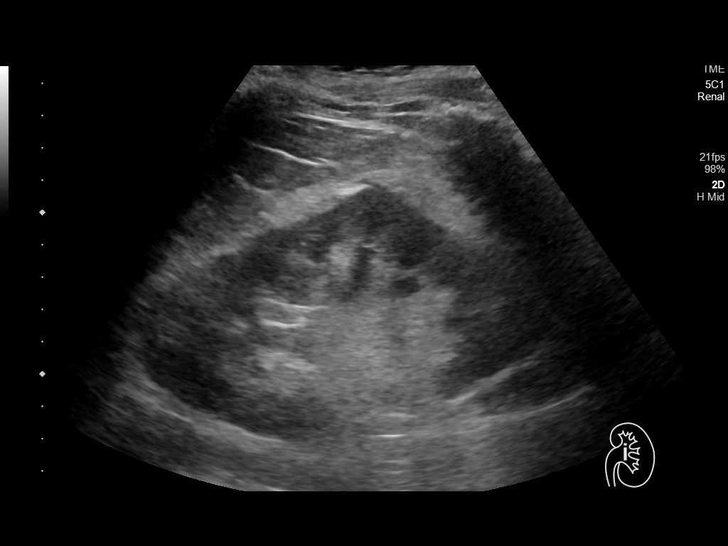
[im 40/64]
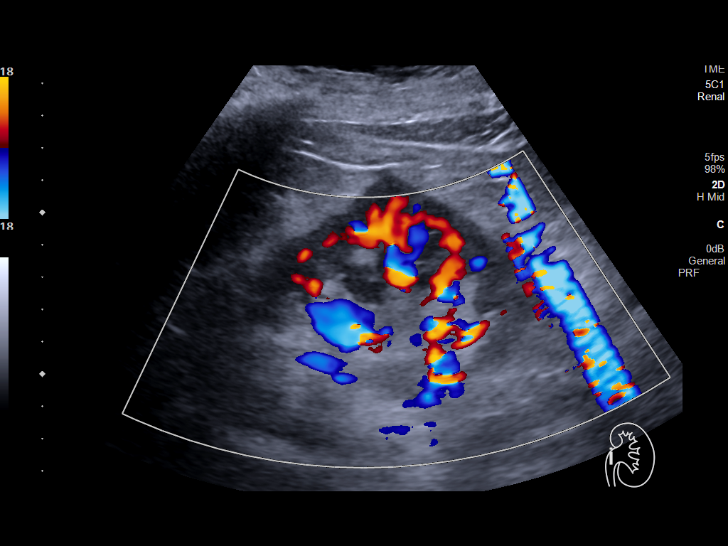
[im 43/64]
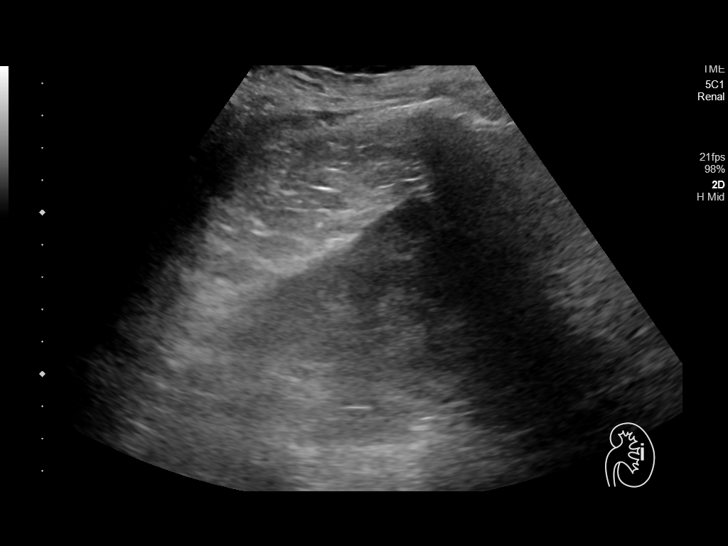
[im 48/64]
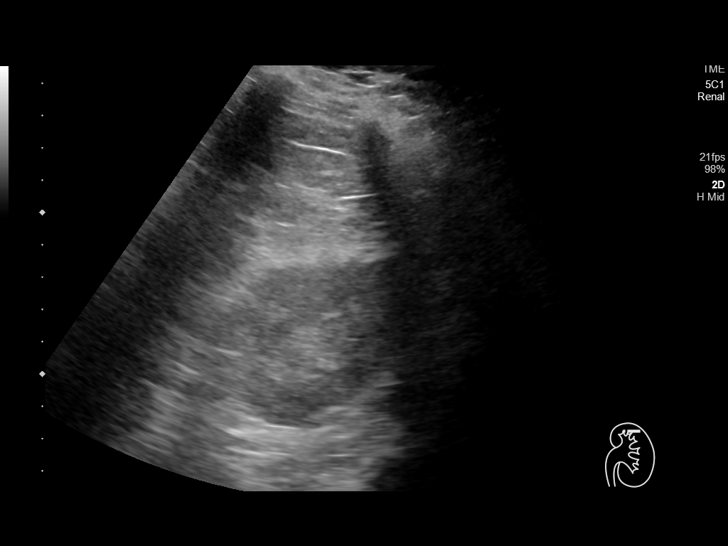
[im 53/64]
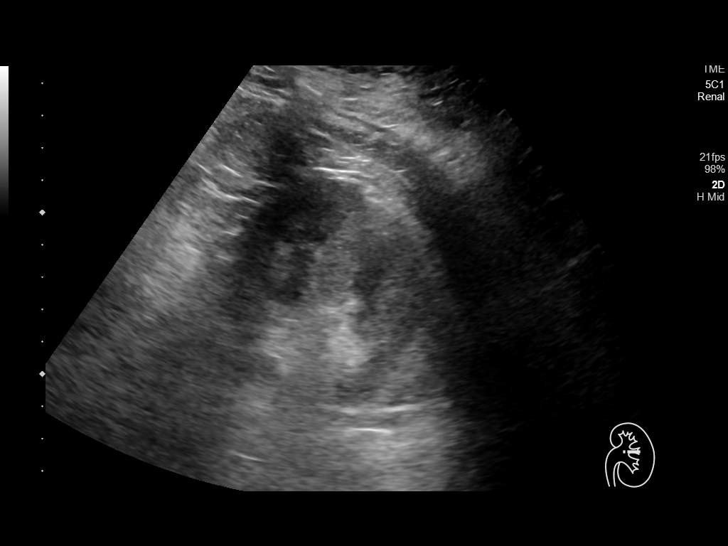
[im 58/64]
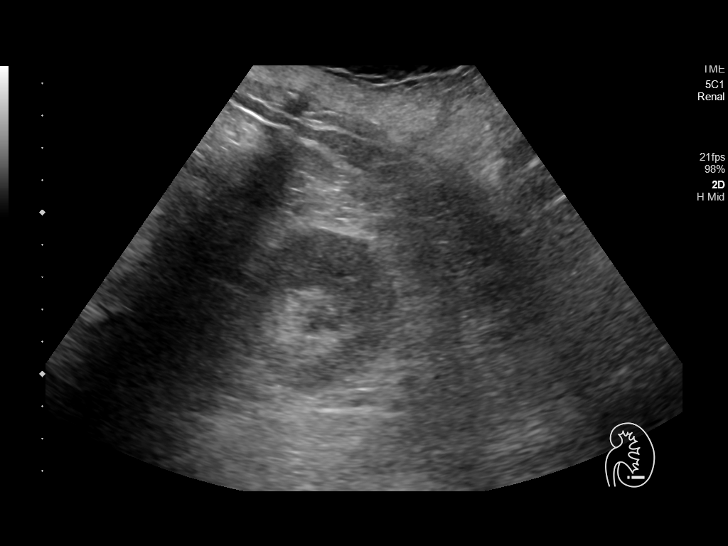
[im 64/64]
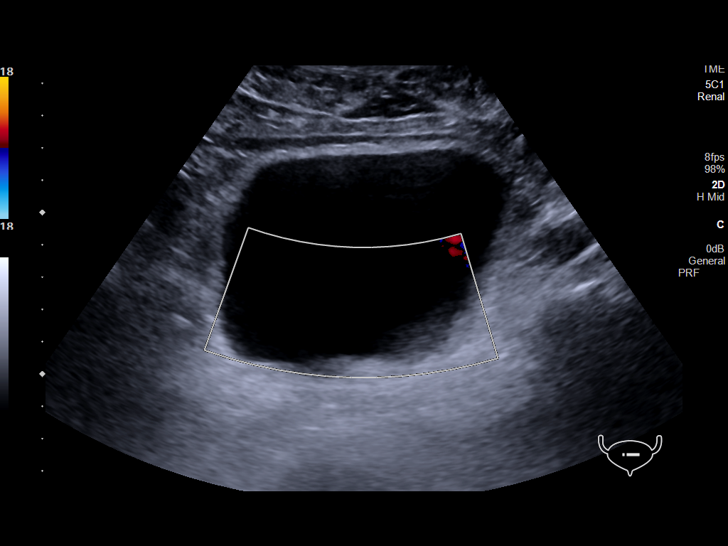

[14 of 25 positions shown; findings below may reference images not displayed]

FINDINGS: Right Kidney:

Renal measurements: 10.9 x 5.8 x 5.1 cm = volume: 166.3 mL .
Echogenicity within normal limits. No mass or hydronephrosis
visualized.

Left Kidney:

Renal measurements: 12.8 x 7.4 x 6.2 cm = volume: 304.9 mL.
Echogenicity within normal limits. No mass or hydronephrosis
visualized.

Bladder:

Appears normal for degree of bladder distention. Left ureteral jet
not visualized during observation window.

Other:

None.
IMPRESSION: Slight asymmetric size of the kidneys left larger than right but
without hydronephrosis or shadowing stone.

## 2021-09-25 DIAGNOSIS — Z8249 Family history of ischemic heart disease and other diseases of the circulatory system: Secondary | ICD-10-CM | POA: Diagnosis not present

## 2021-09-25 DIAGNOSIS — I2510 Atherosclerotic heart disease of native coronary artery without angina pectoris: Secondary | ICD-10-CM | POA: Diagnosis not present

## 2021-09-25 DIAGNOSIS — M1990 Unspecified osteoarthritis, unspecified site: Secondary | ICD-10-CM | POA: Diagnosis not present

## 2021-09-25 DIAGNOSIS — Z7982 Long term (current) use of aspirin: Secondary | ICD-10-CM | POA: Diagnosis not present

## 2021-09-25 DIAGNOSIS — Z6826 Body mass index (BMI) 26.0-26.9, adult: Secondary | ICD-10-CM | POA: Diagnosis not present

## 2021-09-25 DIAGNOSIS — E663 Overweight: Secondary | ICD-10-CM | POA: Diagnosis not present

## 2021-09-25 DIAGNOSIS — I252 Old myocardial infarction: Secondary | ICD-10-CM | POA: Diagnosis not present

## 2021-09-25 DIAGNOSIS — Z809 Family history of malignant neoplasm, unspecified: Secondary | ICD-10-CM | POA: Diagnosis not present

## 2021-09-25 DIAGNOSIS — Z87891 Personal history of nicotine dependence: Secondary | ICD-10-CM | POA: Diagnosis not present

## 2021-09-25 DIAGNOSIS — E785 Hyperlipidemia, unspecified: Secondary | ICD-10-CM | POA: Diagnosis not present

## 2021-09-25 DIAGNOSIS — Z825 Family history of asthma and other chronic lower respiratory diseases: Secondary | ICD-10-CM | POA: Diagnosis not present

## 2021-09-25 DIAGNOSIS — Z85828 Personal history of other malignant neoplasm of skin: Secondary | ICD-10-CM | POA: Diagnosis not present

## 2021-11-12 DIAGNOSIS — Z08 Encounter for follow-up examination after completed treatment for malignant neoplasm: Secondary | ICD-10-CM | POA: Diagnosis not present

## 2021-11-12 DIAGNOSIS — Z86007 Personal history of in-situ neoplasm of skin: Secondary | ICD-10-CM | POA: Diagnosis not present

## 2021-11-12 DIAGNOSIS — L821 Other seborrheic keratosis: Secondary | ICD-10-CM | POA: Diagnosis not present

## 2021-11-12 DIAGNOSIS — L853 Xerosis cutis: Secondary | ICD-10-CM | POA: Diagnosis not present

## 2022-02-25 DIAGNOSIS — H5210 Myopia, unspecified eye: Secondary | ICD-10-CM | POA: Diagnosis not present

## 2022-04-22 DIAGNOSIS — I6523 Occlusion and stenosis of bilateral carotid arteries: Secondary | ICD-10-CM | POA: Diagnosis not present

## 2022-04-22 DIAGNOSIS — R001 Bradycardia, unspecified: Secondary | ICD-10-CM | POA: Diagnosis not present

## 2022-04-22 DIAGNOSIS — E782 Mixed hyperlipidemia: Secondary | ICD-10-CM | POA: Diagnosis not present

## 2022-04-22 DIAGNOSIS — I10 Essential (primary) hypertension: Secondary | ICD-10-CM | POA: Diagnosis not present

## 2022-04-22 DIAGNOSIS — I2510 Atherosclerotic heart disease of native coronary artery without angina pectoris: Secondary | ICD-10-CM | POA: Diagnosis not present

## 2022-05-27 DIAGNOSIS — Z872 Personal history of diseases of the skin and subcutaneous tissue: Secondary | ICD-10-CM | POA: Diagnosis not present

## 2022-05-27 DIAGNOSIS — Z86007 Personal history of in-situ neoplasm of skin: Secondary | ICD-10-CM | POA: Diagnosis not present

## 2022-05-27 DIAGNOSIS — L814 Other melanin hyperpigmentation: Secondary | ICD-10-CM | POA: Diagnosis not present

## 2022-05-27 DIAGNOSIS — L821 Other seborrheic keratosis: Secondary | ICD-10-CM | POA: Diagnosis not present

## 2022-05-27 DIAGNOSIS — D225 Melanocytic nevi of trunk: Secondary | ICD-10-CM | POA: Diagnosis not present

## 2022-05-27 DIAGNOSIS — Z85828 Personal history of other malignant neoplasm of skin: Secondary | ICD-10-CM | POA: Diagnosis not present

## 2022-05-27 DIAGNOSIS — Z08 Encounter for follow-up examination after completed treatment for malignant neoplasm: Secondary | ICD-10-CM | POA: Diagnosis not present

## 2022-07-21 ENCOUNTER — Other Ambulatory Visit: Payer: Self-pay

## 2022-07-27 ENCOUNTER — Other Ambulatory Visit: Payer: Self-pay | Admitting: *Deleted

## 2022-07-27 ENCOUNTER — Telehealth: Payer: Self-pay | Admitting: Urology

## 2022-07-27 DIAGNOSIS — N200 Calculus of kidney: Secondary | ICD-10-CM

## 2022-07-27 NOTE — Telephone Encounter (Signed)
Pt called having symptoms of poss kidney stones.  He is asking should he have an KUB prior to his 12/15 appt?  Thank you

## 2022-07-27 NOTE — Telephone Encounter (Signed)
Yes, order is in

## 2022-08-04 ENCOUNTER — Ambulatory Visit
Admission: RE | Admit: 2022-08-04 | Discharge: 2022-08-04 | Disposition: A | Discharge status: Home / Self Care | Payer: Medicare HMO | Source: Ambulatory Visit | Attending: Urology | Admitting: Urology

## 2022-08-04 DIAGNOSIS — N200 Calculus of kidney: Secondary | ICD-10-CM | POA: Diagnosis not present

## 2022-08-04 DIAGNOSIS — R109 Unspecified abdominal pain: Secondary | ICD-10-CM | POA: Diagnosis not present

## 2022-08-10 ENCOUNTER — Ambulatory Visit (INDEPENDENT_AMBULATORY_CARE_PROVIDER_SITE_OTHER): Payer: Medicare HMO | Admitting: Internal Medicine

## 2022-08-10 ENCOUNTER — Encounter: Payer: Self-pay | Admitting: Internal Medicine

## 2022-08-10 VITALS — BP 110/70 | HR 48 | Temp 97.8°F | Ht 75.0 in | Wt 204.0 lb

## 2022-08-10 DIAGNOSIS — M21611 Bunion of right foot: Secondary | ICD-10-CM | POA: Diagnosis not present

## 2022-08-10 DIAGNOSIS — Z0000 Encounter for general adult medical examination without abnormal findings: Secondary | ICD-10-CM

## 2022-08-10 DIAGNOSIS — N138 Other obstructive and reflux uropathy: Secondary | ICD-10-CM | POA: Diagnosis not present

## 2022-08-10 DIAGNOSIS — E785 Hyperlipidemia, unspecified: Secondary | ICD-10-CM | POA: Diagnosis not present

## 2022-08-10 DIAGNOSIS — I2510 Atherosclerotic heart disease of native coronary artery without angina pectoris: Secondary | ICD-10-CM

## 2022-08-10 DIAGNOSIS — N200 Calculus of kidney: Secondary | ICD-10-CM

## 2022-08-10 DIAGNOSIS — N401 Enlarged prostate with lower urinary tract symptoms: Secondary | ICD-10-CM

## 2022-08-10 LAB — COMPREHENSIVE METABOLIC PANEL
ALT: 28 U/L (ref 0–53)
AST: 38 U/L — ABNORMAL HIGH (ref 0–37)
Albumin: 4.4 g/dL (ref 3.5–5.2)
Alkaline Phosphatase: 62 U/L (ref 39–117)
BUN: 12 mg/dL (ref 6–23)
CO2: 31 mEq/L (ref 19–32)
Calcium: 9.6 mg/dL (ref 8.4–10.5)
Chloride: 101 mEq/L (ref 96–112)
Creatinine, Ser: 0.82 mg/dL (ref 0.40–1.50)
GFR: 88.62 mL/min (ref >60.00)
Glucose, Bld: 87 mg/dL (ref 70–99)
Potassium: 4 mEq/L (ref 3.5–5.1)
Sodium: 137 mEq/L (ref 135–145)
Total Bilirubin: 1 mg/dL (ref 0.2–1.2)
Total Protein: 6.8 g/dL (ref 6.0–8.3)

## 2022-08-10 LAB — LIPID PANEL
Cholesterol: 130 mg/dL (ref 0–200)
HDL: 56.8 mg/dL (ref >39.00)
LDL Cholesterol: 64 mg/dL (ref 0–99)
NonHDL: 73.05
Total CHOL/HDL Ratio: 2
Triglycerides: 45 mg/dL (ref 0.0–149.0)
VLDL: 9 mg/dL (ref 0.0–40.0)

## 2022-08-10 LAB — CBC
HCT: 42.5 % (ref 39.0–52.0)
Hemoglobin: 14.3 g/dL (ref 13.0–17.0)
MCHC: 33.7 g/dL (ref 30.0–36.0)
MCV: 90.7 fl (ref 78.0–100.0)
Platelets: 178 10*3/uL (ref 150.0–400.0)
RBC: 4.69 Mil/uL (ref 4.22–5.81)
RDW: 13.6 % (ref 11.5–15.5)
WBC: 5.5 10*3/uL (ref 4.0–10.5)

## 2022-08-10 NOTE — Assessment & Plan Note (Signed)
No sig symptoms now No Rx

## 2022-08-10 NOTE — Assessment & Plan Note (Signed)
No symptoms since MI Continues on metoprolol 12.5 bid, atorvastatin '80mg'$ , ASA '81mg'$ 

## 2022-08-10 NOTE — Progress Notes (Signed)
Hearing Screening - Comments:: Passed whisper test Vision Screening - Comments:: June 2023?  

## 2022-08-10 NOTE — Assessment & Plan Note (Signed)
Mild recurrent symptoms but x-ray negative Going back to urologist  Discussed adding citrate to his fluids---since calcium stones

## 2022-08-10 NOTE — Assessment & Plan Note (Signed)
No problems with statin--secondary prevention

## 2022-08-10 NOTE — Assessment & Plan Note (Signed)
Mild and no symptoms Great and second toes slightly overlie--but no problems

## 2022-08-10 NOTE — Assessment & Plan Note (Signed)
I have personally reviewed the Medicare Annual Wellness questionnaire and have noted 1. The patient's medical and social history 2. Their use of alcohol, tobacco or illicit drugs 3. Their current medications and supplements 4. The patient's functional ability including ADL's, fall risks, home safety risks and hearing or visual             impairment. 5. Diet and physical activities 6. Evidence for depression or mood disorders  The patients weight, height, BMI and visual acuity have been recorded in the chart I have made referrals, counseling and provided education to the patient based review of the above and I have provided the pt with a written personalized care plan for preventive services.  I have provided you with a copy of your personalized plan for preventive services. Please take the time to review along with your updated medication list.  Has had flu and updated COVID vaccines Td at the pharmacy Stays in shape Colon due 2025 No PSA due to age

## 2022-08-10 NOTE — Progress Notes (Signed)
Subjective:    Patient ID: Philip Mack, male    DOB: 1950-11-09, 71 y.o.   MRN: [id number]  HPI Here for Medicare wellness visit and follow up of chronic health conditions Reviewed advanced directives Reviewed other doctors---Dr Richardson--opto, Dr Quintella Reichert, Dr Roel Cluck, Dr Clayborne Artist, Dr Stoioff--urology, Dr Kowalski--cardiology No hospitalizations or surgery this year (other than skin cancer on leg) Occasional beer No tobacco Vision is fine Hearing is mildly down--considering audiology evaluation Exercises regularly No falls No depression or anhedonia Independent with instrumental ADLs No memory issues  Thinks he bunion on right Toe looks purplish No pain Does use wide toe box and inserts  A few weeks ago--having pain in back/flank Wonders if it is another kidney stone Going to [facility] next week (pushed up the appt) Now feels some better  No heart problems No chest pain or SOB No palpitations No dizziness or syncope No edema  Voids okay Stream is good Empties okay  Current Outpatient Medications on File Prior to Visit  Medication Sig Dispense Refill   aspirin 81 MG chewable tablet Chew 1 tablet (81 mg total) by mouth daily.     metoprolol tartrate (LOPRESSOR) 25 MG tablet Take 0.5 tablets (12.5 mg total) by mouth 2 (two) times daily. 30 tablet 0   atorvastatin (LIPITOR) 80 MG tablet Take 80 mg by mouth every evening.      No current facility-administered medications on file prior to visit.    No Known Allergies  Past Medical History:  Diagnosis Date   Cataract    Hepatitis    Hep A?   History of kidney stones    Hyperlipidemia    Myocardial infarction ([place])    10/12/19    Past Surgical History:  Procedure Laterality Date   Arm injury  1975   COLONOSCOPY     CORONARY STENT INTERVENTION N/A 10/15/2019   Procedure: CORONARY STENT INTERVENTION;  Surgeon: Yolonda Kida, MD;  Location: [facility];  Service:  Cardiovascular;  Laterality: N/A;   CYSTOSCOPY/URETEROSCOPY/HOLMIUM LASER/STENT PLACEMENT Left 01/08/2020   Procedure: CYSTOSCOPY/URETEROSCOPY/HOLMIUM LASER/STENT PLACEMENT;  Surgeon: Abbie Sons, MD;  Location: [facility];  Service: Urology;  Laterality: Left;   EXTRACORPOREAL SHOCK WAVE LITHOTRIPSY Left 12/20/2019   Procedure: EXTRACORPOREAL SHOCK WAVE LITHOTRIPSY (ESWL);  Surgeon: Hollice Espy, MD;  Location: [facility];  Service: Urology;  Laterality: Left;   HERNIA REPAIR  1959   Bilateral, inguinal   LEFT HEART CATH AND CORONARY ANGIOGRAPHY N/A 10/15/2019   Procedure: LEFT HEART CATH AND CORONARY ANGIOGRAPHY;  Surgeon: Corey Skains, MD;  Location: [facility];  Service: Cardiovascular;  Laterality: N/A;   POLYPECTOMY     TONSILLECTOMY AND ADENOIDECTOMY  1961    Family History  Problem Relation Age of Onset   Cancer Mother        Lung, (surgically resected)   Heart disease Mother    Hearing loss Other        On Mom's side   Hypertension Neg Hx    Diabetes Neg Hx    Colon cancer Neg Hx    Colon polyps Neg Hx    Stomach cancer Neg Hx    Rectal cancer Neg Hx     Social History   Socioeconomic History   Marital status: Married    Spouse name: Not on file   Number of children: 2   Years of education: Not on file   Highest education level: Not on file  Occupational History   Occupation: Retired  Traffic Controller  Tobacco Use   Smoking status: Former    Types: Cigarettes    Quit date: 08/30/1980    Years since quitting: 41.9    Passive exposure: Never   Smokeless tobacco: Never  Vaping Use   Vaping Use: Never used  Substance and Sexual Activity   Alcohol use: Yes    Comment: Occasionally   Drug use: No   Sexual activity: Not on file  Other Topics Concern   Not on file  Social History Narrative   Has living will   Wife is health care POA-- then son Randall Hiss   Would accept resuscitation   No tube feeds if cognitively unaware   Social Determinants of  Health   Financial Resource Strain: Not on file  Food Insecurity: No Food Insecurity (10/19/2019)   Hunger Vital Sign    Worried About Running Out of Food in the Last Year: Never true    Ran Out of Food in the Last Year: Never true  Transportation Needs: No Transportation Needs (10/19/2019)   PRAPARE - Hydrologist (Medical): No    Lack of Transportation (Non-Medical): No  Physical Activity: Not on file  Stress: Not on file  Social Connections: Not on file  Intimate Partner Violence: Not on file   Review of Systems Appetite is good Weight is stable Sleeps fine Wears seat belt Teeth/gums okay----keeps up No heartburn or dysphagia Bowels move okay--no blood No sig back or joint pains    Objective:   Physical Exam Constitutional:      Appearance: Normal appearance.  HENT:     Mouth/Throat:     Comments: No lesions Eyes:     Conjunctiva/sclera: Conjunctivae normal.     Pupils: Pupils are equal, round, and reactive to light.  Cardiovascular:     Rate and Rhythm: Regular rhythm. Bradycardia present.     Pulses: Normal pulses.     Heart sounds: No murmur heard.    No gallop.  Pulmonary:     Effort: Pulmonary effort is normal.     Breath sounds: Normal breath sounds. No wheezing or rales.  Abdominal:     Palpations: Abdomen is soft.     Tenderness: There is no abdominal tenderness.  Musculoskeletal:     Cervical back: Neck supple.     Right lower leg: No edema.     Left lower leg: No edema.     Comments: Mild bunion right foot  Lymphadenopathy:     Cervical: No cervical adenopathy.  Skin:    Findings: No lesion or rash.  Neurological:     General: No focal deficit present.     Mental Status: He is alert and oriented to person, place, and time.     Comments: Mini-cog normal  Psychiatric:        Mood and Affect: Mood normal.        Behavior: Behavior normal.            Assessment & Plan:

## 2022-08-13 ENCOUNTER — Encounter: Payer: Self-pay | Admitting: Urology

## 2022-08-13 ENCOUNTER — Ambulatory Visit (INDEPENDENT_AMBULATORY_CARE_PROVIDER_SITE_OTHER): Payer: Medicare HMO | Admitting: Urology

## 2022-08-13 VITALS — BP 123/70 | HR 64 | Ht 75.0 in | Wt 204.0 lb

## 2022-08-13 DIAGNOSIS — N200 Calculus of kidney: Secondary | ICD-10-CM

## 2022-08-13 LAB — URINALYSIS, COMPLETE
Bilirubin, UA: NEGATIVE
Glucose, UA: NEGATIVE
Ketones, UA: NEGATIVE
Leukocytes,UA: NEGATIVE
Nitrite, UA: NEGATIVE
Protein,UA: NEGATIVE
RBC, UA: NEGATIVE
Specific Gravity, UA: 1.015 (ref 1.005–1.030)
Urobilinogen, Ur: 0.2 mg/dL (ref 0.2–1.0)
pH, UA: 6.5 (ref 5.0–7.5)

## 2022-08-13 LAB — MICROSCOPIC EXAMINATION

## 2022-08-13 NOTE — Progress Notes (Signed)
08/13/2022 8:05 AM   Philip Mack [birth year] [id number]  Referring provider: Venia Carbon, MD [address] [place],  [place] [address]  Chief Complaint  Patient presents with   Nephrolithiasis    Urologic history:  1.  Nephrolithiasis -Episode renal colic 10/4740; 7 mm left distal ureteral calculus -Initial shockwave lithotripsy without significant fragmentation -Subsequent ureteroscopic removal 12/2019 -Treatment delayed due to recent MI and cardiology clearance -CT with punctate bilateral renal calculi -Stone analysis 100% calcium oxalate monohydrate -Metabolic evaluation mild uric acid elevation; uric acid SS was normal   2.  Peyronie's disease   HPI: 71 y.o. male presents for annual follow-up.  He was scheduled to be seen in January 2024 and called for an earlier appointment LUTS he thought he may have a stone He was having some left low back pain and radiation to the hip region and thinks it may be his hip.  His pain has improved No bothersome LUTS Denies dysuria, gross hematuria   PMH: Past Medical History:  Diagnosis Date   Cataract    Hepatitis    Hep A?   History of kidney stones    Hyperlipidemia    Myocardial infarction [facility])    10/12/19    Surgical History: Past Surgical History:  Procedure Laterality Date   Arm injury  1975   COLONOSCOPY     CORONARY STENT INTERVENTION N/A 10/15/2019   Procedure: CORONARY STENT INTERVENTION;  Surgeon: Yolonda Kida, MD;  Location: [place] [facility];  Service: Cardiovascular;  Laterality: N/A;   CYSTOSCOPY/URETEROSCOPY/HOLMIUM LASER/STENT PLACEMENT Left 01/08/2020   Procedure: CYSTOSCOPY/URETEROSCOPY/HOLMIUM LASER/STENT PLACEMENT;  Surgeon: Abbie Sons, MD;  Location: [facility];  Service: Urology;  Laterality: Left;   EXTRACORPOREAL SHOCK WAVE LITHOTRIPSY Left 12/20/2019   Procedure: EXTRACORPOREAL SHOCK WAVE LITHOTRIPSY (ESWL);  Surgeon: Hollice Espy, MD;  Location: [facility];   Service: Urology;  Laterality: Left;   HERNIA REPAIR  1959   Bilateral, inguinal   LEFT HEART CATH AND CORONARY ANGIOGRAPHY N/A 10/15/2019   Procedure: LEFT HEART CATH AND CORONARY ANGIOGRAPHY;  Surgeon: Corey Skains, MD;  Location: [place] [facility];  Service: Cardiovascular;  Laterality: N/A;   POLYPECTOMY     TONSILLECTOMY AND ADENOIDECTOMY  1961    Home Medications:  Allergies as of 08/13/2022   No Known Allergies      Medication List        Accurate as of August 13, 2022  8:05 AM. If you have any questions, ask your nurse or doctor.          aspirin 81 MG chewable tablet Chew 1 tablet (81 mg total) by mouth daily.   atorvastatin 80 MG tablet Commonly known as: LIPITOR Take 80 mg by mouth every evening.   metoprolol tartrate 25 MG tablet Commonly known as: LOPRESSOR Take 0.5 tablets (12.5 mg total) by mouth 2 (two) times daily.        Allergies: No Known Allergies  Family History: Family History  Problem Relation Age of Onset   Cancer Mother        Lung, (surgically resected)   Heart disease Mother    Hearing loss Other        On Mom's side   Hypertension Neg Hx    Diabetes Neg Hx    Colon cancer Neg Hx    Colon polyps Neg Hx    Stomach cancer Neg Hx    Rectal cancer Neg Hx     Social History:  reports  that he quit smoking about 41 years ago. His smoking use included cigarettes. He has never been exposed to tobacco smoke. He has never used smokeless tobacco. He reports current alcohol use. He reports that he does not use drugs.   Physical Exam: BP 123/70   Pulse 64   Ht '6\' 3"'$  (1.905 m)   Wt 204 lb (92.5 kg)   BMI 25.50 kg/m   Constitutional:  Alert and oriented, No acute distress. HEENT: [place] AT, moist mucus membranes.  Trachea midline, no masses. Cardiovascular: No clubbing, cyanosis, or edema. Respiratory: Normal respiratory effort, no increased work of breathing. Psychiatric: Normal mood and affect.   Pertinent Imaging: KUB  images performed today were personally reviewed and interpreted.  There is an increased density overlying the left renal outline measuring approximately 5 mm though may be within the bowel    Assessment & Plan:    1.  Nephrolithiasis Punctate bilateral renal calculi seen on prior CT Density overlying the left renal outline; if UA shows no microhematuria will repeat a KUB in 6 months and he will call for recurrent pain Continue annual follow-up; call as needed for recurrent flank pain or renal colic   Abbie Sons, MD  [facility] [address] [place], [place] [address] [phone]

## 2022-09-22 ENCOUNTER — Ambulatory Visit: Payer: Self-pay | Admitting: Urology

## 2022-11-17 ENCOUNTER — Other Ambulatory Visit: Payer: Self-pay

## 2022-11-17 MED ORDER — BOOSTRIX 5-2.5-18.5 LF-MCG/0.5 IM SUSY
PREFILLED_SYRINGE | INTRAMUSCULAR | 0 refills | Status: DC
  Filled 2022-11-17: qty 0.5, 1d supply, fill #0

## 2023-01-18 DIAGNOSIS — I10 Essential (primary) hypertension: Secondary | ICD-10-CM | POA: Diagnosis not present

## 2023-01-18 DIAGNOSIS — E782 Mixed hyperlipidemia: Secondary | ICD-10-CM | POA: Diagnosis not present

## 2023-01-18 DIAGNOSIS — R001 Bradycardia, unspecified: Secondary | ICD-10-CM | POA: Diagnosis not present

## 2023-01-18 DIAGNOSIS — I2510 Atherosclerotic heart disease of native coronary artery without angina pectoris: Secondary | ICD-10-CM | POA: Diagnosis not present

## 2023-01-18 DIAGNOSIS — I6523 Occlusion and stenosis of bilateral carotid arteries: Secondary | ICD-10-CM | POA: Diagnosis not present

## 2023-01-18 DIAGNOSIS — I429 Cardiomyopathy, unspecified: Secondary | ICD-10-CM | POA: Diagnosis not present

## 2023-01-25 DIAGNOSIS — I428 Other cardiomyopathies: Secondary | ICD-10-CM | POA: Diagnosis not present

## 2023-01-25 DIAGNOSIS — I429 Cardiomyopathy, unspecified: Secondary | ICD-10-CM | POA: Diagnosis not present

## 2023-02-02 ENCOUNTER — Ambulatory Visit
Admission: RE | Admit: 2023-02-02 | Discharge: 2023-02-02 | Disposition: A | Discharge status: Home / Self Care | Payer: Medicare HMO | Source: Ambulatory Visit | Attending: Urology | Admitting: Urology

## 2023-02-02 ENCOUNTER — Ambulatory Visit
Admission: RE | Admit: 2023-02-02 | Discharge: 2023-02-02 | Disposition: A | Discharge status: Home / Self Care | Payer: Medicare HMO | Attending: Urology | Admitting: Urology

## 2023-02-02 DIAGNOSIS — N200 Calculus of kidney: Secondary | ICD-10-CM | POA: Diagnosis not present

## 2023-02-11 ENCOUNTER — Ambulatory Visit
Admission: RE | Admit: 2023-02-11 | Discharge: 2023-02-11 | Disposition: A | Discharge status: Home / Self Care | Payer: Medicare HMO | Source: Ambulatory Visit | Attending: Urology | Admitting: Urology

## 2023-02-11 ENCOUNTER — Other Ambulatory Visit: Payer: Self-pay | Admitting: *Deleted

## 2023-02-11 DIAGNOSIS — R918 Other nonspecific abnormal finding of lung field: Secondary | ICD-10-CM

## 2023-02-24 DIAGNOSIS — H5203 Hypermetropia, bilateral: Secondary | ICD-10-CM | POA: Diagnosis not present

## 2023-03-25 DIAGNOSIS — Z0100 Encounter for examination of eyes and vision without abnormal findings: Secondary | ICD-10-CM | POA: Diagnosis not present

## 2023-05-05 DIAGNOSIS — E785 Hyperlipidemia, unspecified: Secondary | ICD-10-CM | POA: Diagnosis not present

## 2023-05-05 DIAGNOSIS — Z8249 Family history of ischemic heart disease and other diseases of the circulatory system: Secondary | ICD-10-CM | POA: Diagnosis not present

## 2023-05-05 DIAGNOSIS — Z809 Family history of malignant neoplasm, unspecified: Secondary | ICD-10-CM | POA: Diagnosis not present

## 2023-05-05 DIAGNOSIS — I25119 Atherosclerotic heart disease of native coronary artery with unspecified angina pectoris: Secondary | ICD-10-CM | POA: Diagnosis not present

## 2023-05-05 DIAGNOSIS — I739 Peripheral vascular disease, unspecified: Secondary | ICD-10-CM | POA: Diagnosis not present

## 2023-05-05 DIAGNOSIS — M1990 Unspecified osteoarthritis, unspecified site: Secondary | ICD-10-CM | POA: Diagnosis not present

## 2023-05-05 DIAGNOSIS — Z85828 Personal history of other malignant neoplasm of skin: Secondary | ICD-10-CM | POA: Diagnosis not present

## 2023-05-05 DIAGNOSIS — Z87891 Personal history of nicotine dependence: Secondary | ICD-10-CM | POA: Diagnosis not present

## 2023-05-05 DIAGNOSIS — Z7982 Long term (current) use of aspirin: Secondary | ICD-10-CM | POA: Diagnosis not present

## 2023-05-05 DIAGNOSIS — Z008 Encounter for other general examination: Secondary | ICD-10-CM | POA: Diagnosis not present

## 2023-05-05 DIAGNOSIS — N529 Male erectile dysfunction, unspecified: Secondary | ICD-10-CM | POA: Diagnosis not present

## 2023-05-05 DIAGNOSIS — Z833 Family history of diabetes mellitus: Secondary | ICD-10-CM | POA: Diagnosis not present

## 2023-05-05 DIAGNOSIS — I429 Cardiomyopathy, unspecified: Secondary | ICD-10-CM | POA: Diagnosis not present

## 2023-05-31 DIAGNOSIS — Z08 Encounter for follow-up examination after completed treatment for malignant neoplasm: Secondary | ICD-10-CM | POA: Diagnosis not present

## 2023-05-31 DIAGNOSIS — L814 Other melanin hyperpigmentation: Secondary | ICD-10-CM | POA: Diagnosis not present

## 2023-05-31 DIAGNOSIS — D225 Melanocytic nevi of trunk: Secondary | ICD-10-CM | POA: Diagnosis not present

## 2023-05-31 DIAGNOSIS — L821 Other seborrheic keratosis: Secondary | ICD-10-CM | POA: Diagnosis not present

## 2023-05-31 DIAGNOSIS — Z872 Personal history of diseases of the skin and subcutaneous tissue: Secondary | ICD-10-CM | POA: Diagnosis not present

## 2023-05-31 DIAGNOSIS — Z86007 Personal history of in-situ neoplasm of skin: Secondary | ICD-10-CM | POA: Diagnosis not present

## 2023-05-31 DIAGNOSIS — L570 Actinic keratosis: Secondary | ICD-10-CM | POA: Diagnosis not present

## 2023-05-31 DIAGNOSIS — Z85828 Personal history of other malignant neoplasm of skin: Secondary | ICD-10-CM | POA: Diagnosis not present

## 2023-08-01 ENCOUNTER — Other Ambulatory Visit: Payer: Self-pay | Admitting: *Deleted

## 2023-08-01 DIAGNOSIS — N200 Calculus of kidney: Secondary | ICD-10-CM

## 2023-08-16 ENCOUNTER — Ambulatory Visit: Payer: Medicare HMO | Admitting: Internal Medicine

## 2023-08-16 ENCOUNTER — Encounter: Payer: Self-pay | Admitting: Internal Medicine

## 2023-08-16 VITALS — BP 112/80 | HR 64 | Temp 98.3°F | Ht 76.0 in | Wt 207.0 lb

## 2023-08-16 DIAGNOSIS — E785 Hyperlipidemia, unspecified: Secondary | ICD-10-CM | POA: Diagnosis not present

## 2023-08-16 DIAGNOSIS — N200 Calculus of kidney: Secondary | ICD-10-CM

## 2023-08-16 DIAGNOSIS — N401 Enlarged prostate with lower urinary tract symptoms: Secondary | ICD-10-CM | POA: Diagnosis not present

## 2023-08-16 DIAGNOSIS — Z0000 Encounter for general adult medical examination without abnormal findings: Secondary | ICD-10-CM

## 2023-08-16 DIAGNOSIS — I2510 Atherosclerotic heart disease of native coronary artery without angina pectoris: Secondary | ICD-10-CM | POA: Diagnosis not present

## 2023-08-16 DIAGNOSIS — N138 Other obstructive and reflux uropathy: Secondary | ICD-10-CM

## 2023-08-16 LAB — CBC
HCT: 42.1 % (ref 39.0–52.0)
Hemoglobin: 14.2 g/dL (ref 13.0–17.0)
MCHC: 33.8 g/dL (ref 30.0–36.0)
MCV: 91.9 fL (ref 78.0–100.0)
Platelets: 179 10*3/uL (ref 150.0–400.0)
RBC: 4.58 Mil/uL (ref 4.22–5.81)
RDW: 13.8 % (ref 11.5–15.5)
WBC: 5.9 10*3/uL (ref 4.0–10.5)

## 2023-08-16 LAB — COMPREHENSIVE METABOLIC PANEL
ALT: 32 U/L (ref 0–53)
AST: 40 U/L — ABNORMAL HIGH (ref 0–37)
Albumin: 4.2 g/dL (ref 3.5–5.2)
Alkaline Phosphatase: 61 U/L (ref 39–117)
BUN: 12 mg/dL (ref 6–23)
CO2: 31 meq/L (ref 19–32)
Calcium: 9.8 mg/dL (ref 8.4–10.5)
Chloride: 103 meq/L (ref 96–112)
Creatinine, Ser: 0.91 mg/dL (ref 0.40–1.50)
GFR: 84.42 mL/min (ref >60.00)
Glucose, Bld: 84 mg/dL (ref 70–99)
Potassium: 4.1 meq/L (ref 3.5–5.1)
Sodium: 138 meq/L (ref 135–145)
Total Bilirubin: 0.7 mg/dL (ref 0.2–1.2)
Total Protein: 6.9 g/dL (ref 6.0–8.3)

## 2023-08-16 LAB — LIPID PANEL
Cholesterol: 136 mg/dL (ref 0–200)
HDL: 59.1 mg/dL (ref >39.00)
LDL Cholesterol: 64 mg/dL (ref 0–99)
NonHDL: 77.28
Total CHOL/HDL Ratio: 2
Triglycerides: 66 mg/dL (ref 0.0–149.0)
VLDL: 13.2 mg/dL (ref 0.0–40.0)

## 2023-08-16 NOTE — Assessment & Plan Note (Signed)
Mild symptoms Would start tamsulosin if worsens

## 2023-08-16 NOTE — Progress Notes (Signed)
Hearing Screening - Comments:: Passed whisper test Vision Screening - Comments:: July 2024

## 2023-08-16 NOTE — Assessment & Plan Note (Signed)
Doing well on high dose atorvastatin

## 2023-08-16 NOTE — Assessment & Plan Note (Signed)
No symptoms since stent Now just on ASA 81mg  and atorvastatin 80

## 2023-08-16 NOTE — Assessment & Plan Note (Signed)
Vague symptoms at times Tries to keep up with lots of fluids

## 2023-08-16 NOTE — Assessment & Plan Note (Signed)
I have personally reviewed the Medicare Annual Wellness questionnaire and have noted 1. The patient's medical and social history 2. Their use of alcohol, tobacco or illicit drugs 3. Their current medications and supplements 4. The patient's functional ability including ADL's, fall risks, home safety risks and hearing or visual             impairment. 5. Diet and physical activities 6. Evidence for depression or mood disorders  The patients weight, height, BMI and visual acuity have been recorded in the chart I have made referrals, counseling and provided education to the patient based review of the above and I have provided the pt with a written personalized care plan for preventive services.  I have provided you with a copy of your personalized plan for preventive services. Please take the time to review along with your updated medication list.  Healthy Colon due next year No PSA due to age UTD on all vaccines

## 2023-08-16 NOTE — Progress Notes (Signed)
Subjective:    Patient ID: Philip Mack, male    DOB: 1950/09/13, 72 y.o.   MRN: [id number]  HPI Here for Medicare wellness visit and follow up of chronic health conditions Reviewed advanced directives Reviewed other doctors---Dr Richardson--opto, Dr Forde Dandy, Dr Callwood--cardiology, Dr Lezlie Octave-- dentist, Dr Kyung Bacca, Dr Bettey Mare No hospitalizations or surgery in the past year Exercises regularly Occasional beer or wine No tobacco No falls No depression or anhedonia Vision is good Hearing is pretty good--but is planning audiology visit Independent with instrumental ADLs No sig memory issues  Has routine visit with Dr Lonna Cobb tomorrow Mild symptoms--but no clear stones  No heart problems Off the metoprolol No chest pain or SOB Stable exercise tolerance No dizziness or syncope No edema No palpitations  Voids okay Stream is a bit slow at times--not often Nocturia x 1  Current Outpatient Medications on File Prior to Visit  Medication Sig Dispense Refill   aspirin 81 MG chewable tablet Chew 1 tablet (81 mg total) by mouth daily.     atorvastatin (LIPITOR) 80 MG tablet Take 80 mg by mouth every evening.      No current facility-administered medications on file prior to visit.    No Known Allergies  Past Medical History:  Diagnosis Date   Cataract    Hepatitis    Hep A?   History of kidney stones    Hyperlipidemia    Myocardial infarction ([facility])    10/12/19    Past Surgical History:  Procedure Laterality Date   Arm injury  1975   COLONOSCOPY     CORONARY STENT INTERVENTION N/A 10/15/2019   Procedure: CORONARY STENT INTERVENTION;  Surgeon: Alwyn Pea, MD;  Location: [facility];  Service: Cardiovascular;  Laterality: N/A;   CYSTOSCOPY/URETEROSCOPY/HOLMIUM LASER/STENT PLACEMENT Left 01/08/2020   Procedure: CYSTOSCOPY/URETEROSCOPY/HOLMIUM LASER/STENT PLACEMENT;  Surgeon: [name], MD;  Location: [facility];   Service: Urology;  Laterality: Left;   EXTRACORPOREAL SHOCK WAVE LITHOTRIPSY Left 12/20/2019   Procedure: EXTRACORPOREAL SHOCK WAVE LITHOTRIPSY (ESWL);  Surgeon: Vanna Scotland, MD;  Location: [facility];  Service: Urology;  Laterality: Left;   HERNIA REPAIR  1959   Bilateral, inguinal   LEFT HEART CATH AND CORONARY ANGIOGRAPHY N/A 10/15/2019   Procedure: LEFT HEART CATH AND CORONARY ANGIOGRAPHY;  Surgeon: Lamar Blinks, MD;  Location: [facility];  Service: Cardiovascular;  Laterality: N/A;   POLYPECTOMY     TONSILLECTOMY AND ADENOIDECTOMY  1961    Family History  Problem Relation Age of Onset   Cancer Mother        Lung, (surgically resected)   Heart disease Mother    Hearing loss Other        On Mom's side   Hypertension Neg Hx    Diabetes Neg Hx    Colon cancer Neg Hx    Colon polyps Neg Hx    Stomach cancer Neg Hx    Rectal cancer Neg Hx     Social History   Socioeconomic History   Marital status: Married    Spouse name: Not on file   Number of children: 2   Years of education: Not on file   Highest education level: Not on file  Occupational History   Occupation: Retired Forensic psychologist  Tobacco Use   Smoking status: Former    Current packs/day: 0.00    Types: Cigarettes    Quit date: 08/30/1980    Years since quitting: 42.9    Passive exposure: Never  Smokeless tobacco: Never  Vaping Use   Vaping status: Never Used  Substance and Sexual Activity   Alcohol use: Yes    Comment: Occasionally   Drug use: No   Sexual activity: Not on file  Other Topics Concern   Not on file  Social History Narrative   Has living will   Wife is health care POA-- then son Minerva Areola   Would accept resuscitation   No tube feeds if cognitively unaware   Social Drivers of Corporate investment banker Strain: Not on file  Food Insecurity: No Food Insecurity (10/19/2019)   Hunger Vital Sign    Worried About Running Out of Food in the Last Year: Never true    Ran Out of  Food in the Last Year: Never true  Transportation Needs: No Transportation Needs (10/19/2019)   PRAPARE - Administrator, Civil Service (Medical): No    Lack of Transportation (Non-Medical): No  Physical Activity: Not on file  Stress: Not on file  Social Connections: Not on file  Intimate Partner Violence: Not on file   Review of Systems Appetite is good Weight is stable Sleeps well Wears seat belt Needed a bridge removed---and may get another No suspicious skin lesions now No heartburn or dysphagia Bowels move fine--no blood No sig back pain---still some right shoulder pain (and it pops). Did have injection from Dr Oval Linsey much help    Objective:   Physical Exam Constitutional:      Appearance: Normal appearance.  HENT:     Mouth/Throat:     Pharynx: No oropharyngeal exudate or posterior oropharyngeal erythema.  Eyes:     Conjunctiva/sclera: Conjunctivae normal.     Pupils: Pupils are equal, round, and reactive to light.  Cardiovascular:     Rate and Rhythm: Normal rate and regular rhythm.     Pulses: Normal pulses.     Heart sounds: No murmur heard.    No gallop.  Pulmonary:     Effort: Pulmonary effort is normal.     Breath sounds: Normal breath sounds. No wheezing or rales.  Abdominal:     Palpations: Abdomen is soft.     Tenderness: There is no abdominal tenderness.  Musculoskeletal:     Cervical back: Neck supple.     Right lower leg: No edema.     Left lower leg: No edema.  Lymphadenopathy:     Cervical: No cervical adenopathy.  Skin:    Findings: No lesion or rash.  Neurological:     General: No focal deficit present.     Mental Status: He is alert and oriented to person, place, and time.  Psychiatric:        Mood and Affect: Mood normal.        Behavior: Behavior normal.            Assessment & Plan:

## 2023-08-17 ENCOUNTER — Ambulatory Visit: Payer: Medicare HMO | Admitting: Urology

## 2023-08-17 ENCOUNTER — Ambulatory Visit
Admission: RE | Admit: 2023-08-17 | Discharge: 2023-08-17 | Disposition: A | Discharge status: Home / Self Care | Payer: Medicare HMO | Source: Ambulatory Visit | Attending: Urology | Admitting: Urology

## 2023-08-17 ENCOUNTER — Encounter: Payer: Self-pay | Admitting: Urology

## 2023-08-17 ENCOUNTER — Ambulatory Visit
Admission: RE | Admit: 2023-08-17 | Discharge: 2023-08-17 | Disposition: A | Discharge status: Home / Self Care | Payer: Medicare HMO | Attending: Urology | Admitting: Urology

## 2023-08-17 VITALS — BP 103/69 | HR 80 | Ht 76.0 in | Wt 200.0 lb

## 2023-08-17 DIAGNOSIS — N200 Calculus of kidney: Secondary | ICD-10-CM

## 2023-08-17 DIAGNOSIS — I878 Other specified disorders of veins: Secondary | ICD-10-CM | POA: Diagnosis not present

## 2023-08-17 NOTE — Progress Notes (Signed)
I,Amy L Pierron,acting as a scribe for Philip Altes, MD.,have documented all relevant documentation on the behalf of Philip Altes, MD,as directed by  Philip Altes, MD while in the presence of Philip Altes, MD.  08/17/2023 6:04 PM   Philip Mack Dec 24, 1950 [id number]  Referring provider: Karie Schwalbe, MD [address] [place],  [place] [address]  Chief Complaint  Patient presents with   Nephrolithiasis    Urologic history:  1.  Nephrolithiasis -Episode renal colic 10/2019; 7 mm left distal ureteral calculus -Initial shockwave lithotripsy without significant fragmentation -Subsequent ureteroscopic removal 12/2019 -Treatment delayed due to recent MI and cardiology clearance -CT with punctate bilateral renal calculi -Stone analysis 100% calcium oxalate monohydrate -Metabolic evaluation mild uric acid elevation; uric acid SS was normal   2.  Peyronie's disease   HPI: 72 y.o. male presents for annual follow-up.  No problems since his last visit. Denies flank, abdominal, or pelvic pain.  No bothersome LUTS Denies dysuria, gross hematuria   PMH: Past Medical History:  Diagnosis Date   Cataract    Hepatitis    Hep A?   History of kidney stones    Hyperlipidemia    Myocardial infarction [facility])    10/12/19    Surgical History: Past Surgical History:  Procedure Laterality Date   Arm injury  1975   COLONOSCOPY     CORONARY STENT INTERVENTION N/A 10/15/2019   Procedure: CORONARY STENT INTERVENTION;  Surgeon: Alwyn Pea, MD;  Location: [facility];  Service: Cardiovascular;  Laterality: N/A;   CYSTOSCOPY/URETEROSCOPY/HOLMIUM LASER/STENT PLACEMENT Left 01/08/2020   Procedure: CYSTOSCOPY/URETEROSCOPY/HOLMIUM LASER/STENT PLACEMENT;  Surgeon: Philip Altes, MD;  Location: [facility];  Service: Urology;  Laterality: Left;   EXTRACORPOREAL SHOCK WAVE LITHOTRIPSY Left 12/20/2019   Procedure: EXTRACORPOREAL SHOCK WAVE LITHOTRIPSY (ESWL);   Surgeon: Vanna Scotland, MD;  Location: [facility];  Service: Urology;  Laterality: Left;   HERNIA REPAIR  1959   Bilateral, inguinal   LEFT HEART CATH AND CORONARY ANGIOGRAPHY N/A 10/15/2019   Procedure: LEFT HEART CATH AND CORONARY ANGIOGRAPHY;  Surgeon: Lamar Blinks, MD;  Location: [facility];  Service: Cardiovascular;  Laterality: N/A;   POLYPECTOMY     TONSILLECTOMY AND ADENOIDECTOMY  1961    Home Medications:  Allergies as of 08/17/2023   No Known Allergies      Medication List        Accurate as of August 17, 2023  6:04 PM. If you have any questions, ask your nurse or doctor.          aspirin 81 MG chewable tablet Chew 1 tablet (81 mg total) by mouth daily.   atorvastatin 80 MG tablet Commonly known as: LIPITOR Take 80 mg by mouth every evening.        Allergies: No Known Allergies  Family History: Family History  Problem Relation Age of Onset   Cancer Mother        Lung, (surgically resected)   Heart disease Mother    Hearing loss Other        On Mom's side   Hypertension Neg Hx    Diabetes Neg Hx    Colon cancer Neg Hx    Colon polyps Neg Hx    Stomach cancer Neg Hx    Rectal cancer Neg Hx     Social History:  reports that he quit smoking about 42 years ago. His smoking use included cigarettes. He has never been exposed to  tobacco smoke. He has never used smokeless tobacco. He reports current alcohol use. He reports that he does not use drugs.   Physical Exam: BP 103/69   Pulse 80   Ht 6\' 4"  (1.93 m)   Wt 200 lb (90.7 kg)   BMI 24.34 kg/m   Constitutional:  Alert and oriented, No acute distress. HEENT: [place] AT, moist mucus membranes.  Trachea midline, no masses. Cardiovascular: No clubbing, cyanosis, or edema. Respiratory: Normal respiratory effort, no increased work of breathing. Psychiatric: Normal mood and affect.   Pertinent Imaging: KUB images performed today were personally reviewed and interpreted.  No calcifications  were identified on the expected course of the ureter or overlying renal outlines.   Assessment & Plan:    1.  Nephrolithiasis Punctate bilateral renal calculi seen on prior CT Will move to follow up every other year with KUB; call as needed for recurrent flank pain or renal colic.   I have reviewed the above documentation for accuracy and completeness, and I agree with the above.   Philip Altes, MD  [facility] [address], [address] [place], [place] [address] [phone]

## 2024-01-18 ENCOUNTER — Encounter: Payer: Self-pay | Admitting: Internal Medicine

## 2024-01-19 DIAGNOSIS — I10 Essential (primary) hypertension: Secondary | ICD-10-CM | POA: Diagnosis not present

## 2024-01-19 DIAGNOSIS — I2510 Atherosclerotic heart disease of native coronary artery without angina pectoris: Secondary | ICD-10-CM | POA: Diagnosis not present

## 2024-01-19 DIAGNOSIS — E782 Mixed hyperlipidemia: Secondary | ICD-10-CM | POA: Diagnosis not present

## 2024-01-19 DIAGNOSIS — I429 Cardiomyopathy, unspecified: Secondary | ICD-10-CM | POA: Diagnosis not present

## 2024-01-19 DIAGNOSIS — I6523 Occlusion and stenosis of bilateral carotid arteries: Secondary | ICD-10-CM | POA: Diagnosis not present

## 2024-01-19 DIAGNOSIS — R001 Bradycardia, unspecified: Secondary | ICD-10-CM | POA: Diagnosis not present

## 2024-01-24 ENCOUNTER — Encounter: Payer: Self-pay | Admitting: Internal Medicine

## 2024-03-06 DIAGNOSIS — H524 Presbyopia: Secondary | ICD-10-CM | POA: Diagnosis not present

## 2024-03-13 ENCOUNTER — Other Ambulatory Visit: Payer: Self-pay | Admitting: Medical Genetics

## 2024-03-19 ENCOUNTER — Encounter: Payer: Self-pay | Admitting: Internal Medicine

## 2024-03-20 ENCOUNTER — Ambulatory Visit (AMBULATORY_SURGERY_CENTER)

## 2024-03-20 ENCOUNTER — Encounter: Payer: Self-pay | Admitting: Internal Medicine

## 2024-03-20 VITALS — Ht 75.0 in | Wt 205.0 lb

## 2024-03-20 DIAGNOSIS — Z860100 Personal history of colon polyps, unspecified: Secondary | ICD-10-CM

## 2024-03-20 MED ORDER — NA SULFATE-K SULFATE-MG SULF 17.5-3.13-1.6 GM/177ML PO SOLN: 1.0000 | Freq: Once | ORAL | 0 refills | Status: AC

## 2024-03-20 NOTE — Progress Notes (Signed)

## 2024-03-22 ENCOUNTER — Ambulatory Visit (INDEPENDENT_AMBULATORY_CARE_PROVIDER_SITE_OTHER): Admitting: Family Medicine

## 2024-03-22 ENCOUNTER — Encounter: Payer: Self-pay | Admitting: Family Medicine

## 2024-03-22 ENCOUNTER — Other Ambulatory Visit

## 2024-03-22 ENCOUNTER — Ambulatory Visit
Admission: RE | Admit: 2024-03-22 | Discharge: 2024-03-22 | Disposition: A | Discharge status: Home / Self Care | Source: Ambulatory Visit | Attending: Family Medicine | Admitting: Family Medicine

## 2024-03-22 VITALS — BP 90/50 | HR 64 | Temp 98.4°F | Ht 76.0 in | Wt 210.1 lb

## 2024-03-22 DIAGNOSIS — Z006 Encounter for examination for normal comparison and control in clinical research program: Secondary | ICD-10-CM

## 2024-03-22 DIAGNOSIS — M19011 Primary osteoarthritis, right shoulder: Secondary | ICD-10-CM | POA: Diagnosis not present

## 2024-03-22 DIAGNOSIS — M7551 Bursitis of right shoulder: Secondary | ICD-10-CM | POA: Diagnosis not present

## 2024-03-22 DIAGNOSIS — M25511 Pain in right shoulder: Secondary | ICD-10-CM | POA: Diagnosis not present

## 2024-03-22 DIAGNOSIS — G8929 Other chronic pain: Secondary | ICD-10-CM

## 2024-03-22 MED ORDER — MELOXICAM 15 MG PO TABS: 15.0000 mg | ORAL_TABLET | Freq: Every day | ORAL | 3 refills | Status: DC

## 2024-03-22 NOTE — Progress Notes (Unsigned)
 Jones Viviani T. Jazlen Ogarro, MD, CAQ Sports Medicine [facility] [address] [place], [address]  Phone: [phone]  FAX: [phone]  Philip Mack - 73 y.o. male  MRN [id number]  Date of Birth: 15-Sep-1950  Date: 03/22/2024  PCP: Jimmy Charlie FERNS, MD  Referral: Jimmy Charlie FERNS, MD  Chief Complaint  Patient presents with   Shoulder Pain    Right   Subjective:   Philip Mack is a 73 y.o. very pleasant male patient with Body mass index is 25.58 kg/m. who presents with the following:  Patient has been having right-sided shoulder pain for approaching 10 years.  This does wax and wane, at times it does bother him really quite a lot, but other times he is able to manage this better.  He did see Dr. Josefina in 2018, and he had placed him on a Medrol Dosepak and ultimately did a shoulder injection, which seemed to not help all that much.  In the past, he has had good relief of symptoms while taking meloxicam .  He did take a few meloxicam  over the last week or so, did provide him some significant relief.  He does have some intermittent neck pain occasionally, but right now is not really any pain in the neck, no numbness, tingling, or radicular pains.   Review of Systems is noted in the HPI, as appropriate  Objective:   BP (!) 90/50   Pulse 64   Temp 98.4 F (36.9 C) (Temporal)   Ht 6' 4 (1.93 m)   Wt 210 lb 2 oz (95.3 kg)   SpO2 97%   BMI 25.58 kg/m   GEN: No acute distress; alert,appropriate. PULM: Breathing comfortably in no respiratory distress PSYCH: Normally interactive.    Shoulder: R Inspection: No muscle wasting or winging Ecchymosis/edema: neg  AC joint, scapula, clavicle: NT  He does have some fullness to direct palpation of the subacromial bursa and also some pain there as well as at the supraspinatus insertion  Cervical spine: NT, full ROM Spurling's: neg Abduction: full, 5/5 Flexion: full, 5/5 IR,  full, lift-off: 5/5 ER at neutral: full, 5/5 AC crossover: neg Neer: pos Hawkins: pos Drop Test: neg Jobe: pos Supraspinatus insertion: mild-mod T Bicipital groove: NT Speed's: neg Yergason's: neg Sulcus sign: neg Scapular dyskinesis: none C5-T1 intact  Neuro: Sensation intact Grip 5/5   Laboratory and Imaging Data:  Assessment and Plan:     ICD-10-CM   1. Chronic right shoulder pain  M25.511 DG Shoulder Right   G89.29     2. Subacromial bursitis of right shoulder joint  M75.51     3. Arthritis of right acromioclavicular joint  M19.011       Xrays: Shoulder series Indication: shoulder pain Findings: No evidence of occult fracture Minimal glenohumeral arthritis AC joint: Patient does have mild to moderate acromioclavicular joint arthritis Impingement pathology: Type II acromion Electronically Signed  By: Jacques Schroeder, MD On: 03/22/2024  3:40 PM EDT   He has had an intermittent shoulder pain for a decade.  I think right now he does have some subacromial bursitis and some rotator cuff tendinopathy.  My suspicion is that arthritis is also playing a role, although the glenohumeral joint looks fairly good on plain x-ray.  I gave him a rehab program, [facility] classic program.  Will also do some meloxicam  today.  He that he can use this off and on indefinitely.  He does workout regularly, and I think if he  incorporated some rotator cuff and scapular stabilization work a couple of days a week it would really benefit his shoulder.  edication Management during today's office visit: Meds ordered this encounter  Medications   meloxicam  (MOBIC ) 15 MG tablet    Sig: Take 1 tablet (15 mg total) by mouth daily.    Dispense:  30 tablet    Refill:  3   Medications Discontinued During This Encounter  Medication Reason   atorvastatin  (LIPITOR) 80 MG tablet Duplicate    Orders placed today for conditions managed today: Orders Placed This Encounter  Procedures   DG Shoulder  Right    Disposition: No follow-ups on file.  Dr[employer] One speech-to-text software was used for transcription in this dictation.  Possible transcriptional errors can occur using Animal nutritionist.   Signed,  Jacques DASEN. Genevra Orne, MD   Outpatient Encounter Medications as of 03/22/2024  Medication Sig   aspirin  81 MG chewable tablet Chew 1 tablet (81 mg total) by mouth daily.   atorvastatin  (LIPITOR) 80 MG tablet Take 1 tablet by mouth daily.   ezetimibe (ZETIA) 10 MG tablet Take 10 mg by mouth daily.   meloxicam  (MOBIC ) 15 MG tablet Take 1 tablet (15 mg total) by mouth daily.   [DISCONTINUED] atorvastatin  (LIPITOR) 80 MG tablet Take 80 mg by mouth every evening.    No facility-administered encounter medications on file as of 03/22/2024.

## 2024-03-30 ENCOUNTER — Ambulatory Visit: Payer: Self-pay | Admitting: Family Medicine

## 2024-04-07 LAB — GENECONNECT MOLECULAR SCREEN: Genetic Analysis Overall Interpretation: NEGATIVE

## 2024-04-10 ENCOUNTER — Encounter: Payer: Self-pay | Admitting: Internal Medicine

## 2024-04-10 ENCOUNTER — Ambulatory Visit (AMBULATORY_SURGERY_CENTER): Admitting: Internal Medicine

## 2024-04-10 VITALS — BP 111/68 | HR 51 | Temp 97.9°F | Resp 13 | Ht 76.0 in | Wt 205.0 lb

## 2024-04-10 DIAGNOSIS — K648 Other hemorrhoids: Secondary | ICD-10-CM

## 2024-04-10 DIAGNOSIS — Z1211 Encounter for screening for malignant neoplasm of colon: Secondary | ICD-10-CM | POA: Diagnosis not present

## 2024-04-10 DIAGNOSIS — Z860101 Personal history of adenomatous and serrated colon polyps: Secondary | ICD-10-CM

## 2024-04-10 DIAGNOSIS — K5730 Diverticulosis of large intestine without perforation or abscess without bleeding: Secondary | ICD-10-CM

## 2024-04-10 DIAGNOSIS — Z860100 Personal history of colon polyps, unspecified: Secondary | ICD-10-CM

## 2024-04-10 DIAGNOSIS — E785 Hyperlipidemia, unspecified: Secondary | ICD-10-CM | POA: Diagnosis not present

## 2024-04-10 DIAGNOSIS — I252 Old myocardial infarction: Secondary | ICD-10-CM | POA: Diagnosis not present

## 2024-04-10 MED ORDER — SODIUM CHLORIDE 0.9 % IV SOLN: 500.0000 mL | Freq: Once | INTRAVENOUS | Status: DC

## 2024-04-10 NOTE — Patient Instructions (Signed)
 Resume previous diet. Continue present medications. Repeat colonoscopy in 5 years for surveillance. Handouts provided on diverticulosis and hemorrhoids.   YOU HAD AN ENDOSCOPIC PROCEDURE TODAY AT THE [facility]:   Refer to the procedure report that was given to you for any specific questions about what was found during the examination.  If the procedure report does not answer your questions, please call your gastroenterologist to clarify.  If you requested that your care partner not be given the details of your procedure findings, then the procedure report has been included in a sealed envelope for you to review at your convenience later.  YOU SHOULD EXPECT: Some feelings of bloating in the abdomen. Passage of more gas than usual.  Walking can help get rid of the air that was put into your GI tract during the procedure and reduce the bloating. If you had a lower endoscopy (such as a colonoscopy or flexible sigmoidoscopy) you may notice spotting of blood in your stool or on the toilet paper. If you underwent a bowel prep for your procedure, you may not have a normal bowel movement for a few days.  Please Note:  You might notice some irritation and congestion in your nose or some drainage.  This is from the oxygen used during your procedure.  There is no need for concern and it should clear up in a day or so.  SYMPTOMS TO REPORT IMMEDIATELY:  Following lower endoscopy (colonoscopy or flexible sigmoidoscopy):  Excessive amounts of blood in the stool  Significant tenderness or worsening of abdominal pains  Swelling of the abdomen that is new, acute  Fever of 100F or higher  For urgent or emergent issues, a gastroenterologist can be reached at any hour by calling [phone]. Do not use MyChart messaging for urgent concerns.    DIET:  We do recommend a small meal at first, but then you may proceed to your regular diet.  Drink plenty of fluids but you should avoid alcoholic  beverages for 24 hours.  ACTIVITY:  You should plan to take it easy for the rest of today and you should NOT DRIVE or use heavy machinery until tomorrow (because of the sedation medicines used during the test).    FOLLOW UP: Our staff will call the number listed on your records the next business day following your procedure.  We will call around 7:15- 8:00 am to check on you and address any questions or concerns that you may have regarding the information given to you following your procedure. If we do not reach you, we will leave a message.     If any biopsies were taken you will be contacted by phone or by letter within the next 1-3 weeks.  Please call us  at [phone] if you have not heard about the biopsies in 3 weeks.    SIGNATURES/CONFIDENTIALITY: You and/or your care partner have signed paperwork which will be entered into your electronic medical record.  These signatures attest to the fact that that the information above on your After Visit Summary has been reviewed and is understood.  Full responsibility of the confidentiality of this discharge information lies with you and/or your care-partner.

## 2024-04-10 NOTE — Progress Notes (Signed)
 PT taken to PACU. Monitors in place. VSS. Report given to RN.

## 2024-04-10 NOTE — Op Note (Signed)
 [facility] Patient Name: Philip Mack Procedure Date: 04/10/2024 11:03 AM MRN: [id number] Endoscopist: Gordy CHRISTELLA Starch , MD, [id number] Age: 73 Referring MD:  Date of Birth: 08/23/1951 Gender: Male Account #: [id number] Procedure:                Colonoscopy Indications:              High risk colon cancer surveillance: Personal                            history of sessile serrated colon polyp (less than                            10 mm in size) with no dysplasia, Last colonoscopy:                            May 2020 (no polyps), Jan 2017 (SSP x 2, TA x 2) Medicines:                Monitored Anesthesia Care Procedure:                Pre-Anesthesia Assessment:                           - Prior to the procedure, a History and Physical                            was performed, and patient medications and                            allergies were reviewed. The patient's tolerance of                            previous anesthesia was also reviewed. The risks                            and benefits of the procedure and the sedation                            options and risks were discussed with the patient.                            All questions were answered, and informed consent                            was obtained. Prior Anticoagulants: The patient has                            taken no anticoagulant or antiplatelet agents. ASA                            Grade Assessment: III - A patient with severe                            systemic disease. After reviewing the risks and  benefits, the patient was deemed in satisfactory                            condition to undergo the procedure.                           After obtaining informed consent, the colonoscope                            was passed under direct vision. Throughout the                            procedure, the patient's blood pressure, pulse, and                            oxygen  saturations were monitored continuously. The                            CF [id number] #[id number] was introduced through the anus                            and advanced to the cecum, identified by                            appendiceal orifice and ileocecal valve. The                            colonoscopy was performed without difficulty. The                            patient tolerated the procedure well. The quality                            of the bowel preparation was good. The ileocecal                            valve, appendiceal orifice, and rectum were                            photographed. Scope In: 11:24:59 AM Scope Out: 11:39:42 AM Scope Withdrawal Time: 0 hours 12 minutes 8 seconds  Total Procedure Duration: 0 hours 14 minutes 43 seconds  Findings:                 The digital rectal exam was normal.                           Multiple medium-mouthed and small-mouthed                            diverticula were found in the sigmoid colon and                            descending colon.  Internal hemorrhoids were found during                            retroflexion. The hemorrhoids were small-sized.                           The exam was otherwise without abnormality. Complications:            No immediate complications. Estimated Blood Loss:     Estimated blood loss: none. Impression:               - Moderate diverticulosis in the sigmoid colon and                            in the descending colon.                           - Small internal hemorrhoids.                           - The examination was otherwise normal.                           - No specimens collected. Recommendation:           - Patient has a contact number available for                            emergencies. The signs and symptoms of potential                            delayed complications were discussed with the                            patient. Return to normal activities  tomorrow.                            Written discharge instructions were provided to the                            patient.                           - Resume previous diet.                           - Continue present medications.                           - Repeat colonoscopy in 5 years for surveillance. Gordy CHRISTELLA Starch, MD 04/10/2024 11:44:37 AM This report has been signed electronically.

## 2024-04-10 NOTE — Progress Notes (Signed)
 GASTROENTEROLOGY PROCEDURE H&P NOTE   Primary Care Physician: Jimmy Charlie FERNS, MD    Reason for Procedure:  History of colonic polyps  Plan:    Colonoscopy  Patient is appropriate for endoscopic procedure(s) in the ambulatory ([facility]) setting.  The nature of the procedure, as well as the risks, benefits, and alternatives were carefully and thoroughly reviewed with the patient. Ample time for discussion and questions allowed. The patient understood, was satisfied, and agreed to proceed.     HPI: Philip Mack is a 73 y.o. male who presents for surveillance colonoscopy.  Medical history as below.  Tolerated the prep.  No recent chest pain or shortness of breath.  No abdominal pain today.  Past Medical History:  Diagnosis Date   Cataract    Hepatitis    Hep A?   History of kidney stones    Hyperlipidemia    Myocardial infarction (HCC)    10/12/19    Past Surgical History:  Procedure Laterality Date   Arm injury  1975   COLONOSCOPY     CORONARY STENT INTERVENTION N/A 10/15/2019   Procedure: CORONARY STENT INTERVENTION;  Surgeon: Florencio Cara BIRCH, MD;  Location: [facility] IN[facility]IVE [facility];  Service: Cardiovascular;  Laterality: N/A;   CYSTOSCOPY/URETEROSCOPY/HOLMIUM LASER/STENT PLACEMENT Left 01/08/2020   Procedure: CYSTOSCOPY/URETEROSCOPY/HOLMIUM LASER/STENT PLACEMENT;  Surgeon: Twylla Glendia BROCKS, MD;  Location: [facility] ORS;  Service: Urology;  Laterality: Left;   EXTRACORPOREAL SHOCK WAVE LITHOTRIPSY Left 12/20/2019   Procedure: EXTRACORPOREAL SHOCK WAVE LITHOTRIPSY (ESWL);  Surgeon: Penne Knee, MD;  Location: [facility] ORS;  Service: Urology;  Laterality: Left;   HERNIA REPAIR  1959   Bilateral, inguinal   LEFT HEART CATH AND CORONARY ANGIOGRAPHY N/A 10/15/2019   Procedure: LEFT HEART CATH AND CORONARY ANGIOGRAPHY;  Surgeon: Hester Wolm PARAS, MD;  Location: [facility]IVE CV [facility];  Service: Cardiovascular;  Laterality: N/A;   POLYPECTOMY     TONSILLECTOMY AND ADENOIDECTOMY  1961     Prior to Admission medications   Medication Sig Start Date End Date Taking? Authorizing Provider  aspirin  81 MG chewable tablet Chew 1 tablet (81 mg total) by mouth daily. 10/16/19  Yes Trudy Anthony HERO, MD  atorvastatin  (LIPITOR) 80 MG tablet Take 1 tablet by mouth daily. 02/07/23  Yes [provider]  ezetimibe (ZETIA) 10 MG tablet Take 10 mg by mouth daily. 01/19/24 01/18/25 Yes [provider]  meloxicam  (MOBIC ) 15 MG tablet Take 1 tablet (15 mg total) by mouth daily. 03/22/24   Watt Mirza, MD    Current Outpatient Medications  Medication Sig Dispense Refill   aspirin  81 MG chewable tablet Chew 1 tablet (81 mg total) by mouth daily.     atorvastatin  (LIPITOR) 80 MG tablet Take 1 tablet by mouth daily.     ezetimibe (ZETIA) 10 MG tablet Take 10 mg by mouth daily.     meloxicam  (MOBIC ) 15 MG tablet Take 1 tablet (15 mg total) by mouth daily. 30 tablet 3   Current Facility-Administered Medications  Medication Dose Route Frequency Provider Last Rate Last Admin   0.9 %  sodium chloride  infusion  500 mL Intravenous Once Myson Levi, Gordy HERO, MD        Allergies as of 04/10/2024   (No Known Allergies)    Family History  Problem Relation Age of Onset   Cancer Mother        Lung, (surgically resected)   Heart disease Mother    Hearing loss Other        On Mom's  side   Hypertension Neg Hx    Diabetes Neg Hx    Colon cancer Neg Hx    Colon polyps Neg Hx    Stomach cancer Neg Hx    Rectal cancer Neg Hx     Social History   Socioeconomic History   Marital status: Married    Spouse name: Not on file   Number of children: 2   Years of education: Not on file   Highest education level: 12th grade  Occupational History   Occupation: Retired Forensic psychologist  Tobacco Use   Smoking status: Former    Current packs/day: 0.00    Types: Cigarettes    Quit date: 08/30/1980    Years since quitting: 43.6    Passive exposure: Never   Smokeless tobacco: Never   Vaping Use   Vaping status: Never Used  Substance and Sexual Activity   Alcohol use: Yes    Comment: Occasionally   Drug use: No   Sexual activity: Not on file  Other Topics Concern   Not on file  Social History Narrative   Has living will   Wife is health care POA-- then son Camellia   Would accept resuscitation   No tube feeds if cognitively unaware   Social Drivers of Health   Financial Resource Strain: Low Risk  (03/20/2024)   Overall Financial Resource Strain (CARDIA)    Difficulty of Paying Living Expenses: Not very hard  Food Insecurity: No Food Insecurity (03/20/2024)   Hunger Vital Sign    Worried About Running Out of Food in the Last Year: Never true    Ran Out of Food in the Last Year: Never true  Transportation Needs: No Transportation Needs (03/20/2024)   PRAPARE - Administrator, Civil Service (Medical): No    Lack of Transportation (Non-Medical): No  Physical Activity: Sufficiently Active (03/20/2024)   Exercise Vital Sign    Days of Exercise per Week: 6 days    Minutes of Exercise per Session: 60 min  Stress: No Stress Concern Present (03/20/2024)   [employer] of Occupational Health - Occupational Stress Questionnaire    Feeling of Stress: Only a little  Social Connections: Moderately Integrated (03/20/2024)   Social Connection and Isolation Panel    Frequency of Communication with Friends and Family: Once a week    Frequency of Social Gatherings with Friends and Family: Once a week    Attends Religious Services: More than 4 times per year    Active Member of [employer] or Organizations: Yes    Attends Banker Meetings: 1 to 4 times per year    Marital Status: Married  Catering manager Violence: Not on file    Physical Exam: Vital signs in last 24 hours: @BP  120/64   Pulse 64   Temp 97.9 F (36.6 C) (Temporal)   Ht 6' 4 (1.93 m)   Wt 205 lb (93 kg)   SpO2 97%   BMI 24.95 kg/m  GEN: NAD EYE: Sclerae anicteric ENT: MMM CV:  Non-tachycardic Pulm: CTA b/l GI: Soft, NT/ND NEURO:  Alert & Oriented x 3   Gordy Starch, MD [facility]  04/10/2024 11:05 AM

## 2024-04-11 ENCOUNTER — Telehealth: Payer: Self-pay | Admitting: *Deleted

## 2024-04-11 NOTE — Telephone Encounter (Signed)
  Follow up Call-     04/10/2024   10:46 AM  Call back number  Post procedure Call Back phone  # [phone]  Permission to leave phone message Yes     Patient questions:  Do you have a fever, pain , or abdominal swelling? No. Pain Score  0 *  Have you tolerated food without any problems? Yes.    Have you been able to return to your normal activities? Yes.    Do you have any questions about your discharge instructions: Diet   No. Medications  No. Follow up visit  No.  Do you have questions or concerns about your Care? No.  Actions: * If pain score is 4 or above: No action needed, pain <4.

## 2024-05-02 DIAGNOSIS — I6523 Occlusion and stenosis of bilateral carotid arteries: Secondary | ICD-10-CM | POA: Diagnosis not present

## 2024-05-02 DIAGNOSIS — I429 Cardiomyopathy, unspecified: Secondary | ICD-10-CM | POA: Diagnosis not present

## 2024-05-02 DIAGNOSIS — E782 Mixed hyperlipidemia: Secondary | ICD-10-CM | POA: Diagnosis not present

## 2024-05-02 DIAGNOSIS — I10 Essential (primary) hypertension: Secondary | ICD-10-CM | POA: Diagnosis not present

## 2024-05-02 DIAGNOSIS — R0789 Other chest pain: Secondary | ICD-10-CM | POA: Diagnosis not present

## 2024-05-02 DIAGNOSIS — R001 Bradycardia, unspecified: Secondary | ICD-10-CM | POA: Diagnosis not present

## 2024-05-02 DIAGNOSIS — I2510 Atherosclerotic heart disease of native coronary artery without angina pectoris: Secondary | ICD-10-CM | POA: Diagnosis not present

## 2024-05-10 DIAGNOSIS — R0789 Other chest pain: Secondary | ICD-10-CM | POA: Diagnosis not present

## 2024-05-31 DIAGNOSIS — Z872 Personal history of diseases of the skin and subcutaneous tissue: Secondary | ICD-10-CM | POA: Diagnosis not present

## 2024-05-31 DIAGNOSIS — D1801 Hemangioma of skin and subcutaneous tissue: Secondary | ICD-10-CM | POA: Diagnosis not present

## 2024-05-31 DIAGNOSIS — Z85828 Personal history of other malignant neoplasm of skin: Secondary | ICD-10-CM | POA: Diagnosis not present

## 2024-05-31 DIAGNOSIS — L570 Actinic keratosis: Secondary | ICD-10-CM | POA: Diagnosis not present

## 2024-05-31 DIAGNOSIS — L814 Other melanin hyperpigmentation: Secondary | ICD-10-CM | POA: Diagnosis not present

## 2024-05-31 DIAGNOSIS — L821 Other seborrheic keratosis: Secondary | ICD-10-CM | POA: Diagnosis not present

## 2024-05-31 DIAGNOSIS — Z86007 Personal history of in-situ neoplasm of skin: Secondary | ICD-10-CM | POA: Diagnosis not present

## 2024-05-31 DIAGNOSIS — Z08 Encounter for follow-up examination after completed treatment for malignant neoplasm: Secondary | ICD-10-CM | POA: Diagnosis not present

## 2024-07-03 DIAGNOSIS — I10 Essential (primary) hypertension: Secondary | ICD-10-CM | POA: Diagnosis not present

## 2024-07-03 DIAGNOSIS — R001 Bradycardia, unspecified: Secondary | ICD-10-CM | POA: Diagnosis not present

## 2024-07-03 DIAGNOSIS — E782 Mixed hyperlipidemia: Secondary | ICD-10-CM | POA: Diagnosis not present

## 2024-07-03 DIAGNOSIS — I2510 Atherosclerotic heart disease of native coronary artery without angina pectoris: Secondary | ICD-10-CM | POA: Diagnosis not present

## 2024-07-03 DIAGNOSIS — I429 Cardiomyopathy, unspecified: Secondary | ICD-10-CM | POA: Diagnosis not present

## 2024-07-03 DIAGNOSIS — I6523 Occlusion and stenosis of bilateral carotid arteries: Secondary | ICD-10-CM | POA: Diagnosis not present

## 2024-07-05 ENCOUNTER — Ambulatory Visit (INDEPENDENT_AMBULATORY_CARE_PROVIDER_SITE_OTHER): Admitting: Nurse Practitioner

## 2024-07-05 ENCOUNTER — Encounter: Payer: Self-pay | Admitting: Nurse Practitioner

## 2024-07-05 VITALS — BP 112/72 | HR 57 | Temp 97.9°F | Ht 74.5 in | Wt 207.8 lb

## 2024-07-05 DIAGNOSIS — N138 Other obstructive and reflux uropathy: Secondary | ICD-10-CM | POA: Diagnosis not present

## 2024-07-05 DIAGNOSIS — I2510 Atherosclerotic heart disease of native coronary artery without angina pectoris: Secondary | ICD-10-CM | POA: Diagnosis not present

## 2024-07-05 DIAGNOSIS — E782 Mixed hyperlipidemia: Secondary | ICD-10-CM | POA: Diagnosis not present

## 2024-07-05 DIAGNOSIS — I10 Essential (primary) hypertension: Secondary | ICD-10-CM | POA: Diagnosis not present

## 2024-07-05 DIAGNOSIS — N401 Enlarged prostate with lower urinary tract symptoms: Secondary | ICD-10-CM | POA: Diagnosis not present

## 2024-07-05 NOTE — Assessment & Plan Note (Signed)
 Patient does have some flow issues along with nocturia intermittently.  Did discuss tamsulosin .  Patient has taken medication in the past with kidney stones.  He would like to hold off on intervention at this juncture

## 2024-07-05 NOTE — Patient Instructions (Signed)
 Nice to see you today I want to see you in approx 6 weeks for your physical and labs Follow up sooner if yo uneed me

## 2024-07-05 NOTE — Progress Notes (Signed)
 Established Patient Office Visit  Subjective   Patient ID: Philip Mack, male    DOB: Sep 24, 1950  Age: 73 y.o. MRN: [id number]  Chief Complaint  Patient presents with   Transitions Of Care    Pt would like cholesterol levels checked per cardiologist from [facility] clinic,     HPI  CAD: Currently maintained on atorvastatin  80 mg daily, ezetimibe 10 mg daily, and aspirin  81 mg daily. Patient is followed by cardiology sees Dr. Marven call would   Colonoscopy: 04/10/2024, Repeat in 5 years patient will be due in 2030 PSA: aged out?  Tdap: 2024 Flu: 04/2024 Pneumonia: 2025 Shingles: up to date COVID: original and booster     Review of Systems  Constitutional:  Negative for chills and fever.  Respiratory:  Negative for shortness of breath.   Cardiovascular:  Negative for chest pain and leg swelling.  Gastrointestinal:  Negative for abdominal pain, blood in stool, constipation, diarrhea, nausea and vomiting.       Bm daily   Genitourinary:  Negative for dysuria and hematuria.       Nocturia x1  Neurological:  Negative for tingling and headaches.  Psychiatric/Behavioral:  Negative for hallucinations and suicidal ideas.       Objective:     BP 112/72   Pulse (!) 57   Temp 97.9 F (36.6 C) (Oral)   Ht 6' 2.5 (1.892 m)   Wt 207 lb 12.8 oz (94.3 kg)   SpO2 99%   BMI 26.32 kg/m  BP Readings from Last 3 Encounters:  07/05/24 112/72  04/10/24 111/68  03/22/24 (!) 90/50   Wt Readings from Last 3 Encounters:  07/05/24 207 lb 12.8 oz (94.3 kg)  04/10/24 205 lb (93 kg)  03/22/24 210 lb 2 oz (95.3 kg)   SpO2 Readings from Last 3 Encounters:  07/05/24 99%  04/10/24 97%  03/22/24 97%      Physical Exam Vitals and nursing note reviewed.  Constitutional:      Appearance: Normal appearance.  HENT:     Right Ear: Tympanic membrane, ear canal and external ear normal.     Left Ear: Tympanic membrane, ear canal and external ear normal.     Mouth/Throat:      Mouth: Mucous membranes are moist.     Pharynx: Oropharynx is clear.  Eyes:     Extraocular Movements: Extraocular movements intact.     Pupils: Pupils are equal, round, and reactive to light.  Cardiovascular:     Rate and Rhythm: Normal rate and regular rhythm.     Pulses: Normal pulses.     Heart sounds: Normal heart sounds.  Pulmonary:     Effort: Pulmonary effort is normal.     Breath sounds: Normal breath sounds.  Abdominal:     General: Bowel sounds are normal. There is no distension.     Palpations: There is no mass.     Tenderness: There is no abdominal tenderness.     Hernia: No hernia is present.  Musculoskeletal:     Right lower leg: No edema.     Left lower leg: No edema.  Lymphadenopathy:     Cervical: No cervical adenopathy.  Skin:    General: Skin is warm.  Neurological:     General: No focal deficit present.     Mental Status: He is alert.     Deep Tendon Reflexes:     Reflex Scores:      Bicep reflexes are 2+ on the right side and  2+ on the left side.      Patellar reflexes are 2+ on the right side and 2+ on the left side.    Comments: Bilateral upper and lower extremity strength 5/5  Psychiatric:        Mood and Affect: Mood normal.        Behavior: Behavior normal.        Thought Content: Thought content normal.        Judgment: Judgment normal.      No results found for any visits on 07/05/24.    The ASCVD Risk score (Arnett DK, et al., 2019) failed to calculate for the following reasons:   Risk score cannot be calculated because patient has a medical history suggesting prior/existing ASCVD    Assessment & Plan:   Problem List Items Addressed This Visit       Cardiovascular and Mediastinum   Coronary artery disease involving native coronary artery of native heart - Primary   History of the same with stenting in place.  Followed by cardiology.  Patient on atorvastatin  80 mg and ezetimibe 10 mg daily.  Continue following cardiology as  recommended will check lipids at patient's wellness visit      Benign essential HTN   Historical diagnosis blood pressure controlled off medications with lifestyle modifications only.  Continue        Genitourinary   BPH with obstruction/lower urinary tract symptoms   Patient does have some flow issues along with nocturia intermittently.  Did discuss tamsulosin .  Patient has taken medication in the past with kidney stones.  He would like to hold off on intervention at this juncture        Other   Hyperlipidemia, mixed   History of same currently maintained on atorvastatin  80 mg daily and ezetimibe 10 mg daily.  Will check lipid panel at wellness visit.       Return in about 6 weeks (around 08/16/2024) for CPE and Labs.    Adina Crandall, NP

## 2024-07-05 NOTE — Assessment & Plan Note (Signed)
 History of same currently maintained on atorvastatin  80 mg daily and ezetimibe 10 mg daily.  Will check lipid panel at wellness visit.

## 2024-07-05 NOTE — Assessment & Plan Note (Signed)
 History of the same with stenting in place.  Followed by cardiology.  Patient on atorvastatin  80 mg and ezetimibe 10 mg daily.  Continue following cardiology as recommended will check lipids at patient's wellness visit

## 2024-07-05 NOTE — Assessment & Plan Note (Signed)
 Historical diagnosis blood pressure controlled off medications with lifestyle modifications only.  Continue

## 2024-07-11 ENCOUNTER — Other Ambulatory Visit: Payer: Self-pay | Admitting: Family Medicine

## 2024-07-11 NOTE — Telephone Encounter (Signed)
 Last office visit 07/05/24 for TOC with Cable.  Last refilled 03/22/24 for #30 with 3 refills by Dr. Watt.  Next Appt: CPE 08/16/24 with Matt.  Refill?

## 2024-07-17 ENCOUNTER — Telehealth: Payer: Self-pay

## 2024-07-17 DIAGNOSIS — I10 Essential (primary) hypertension: Secondary | ICD-10-CM

## 2024-07-17 DIAGNOSIS — Z126 Encounter for screening for malignant neoplasm of bladder: Secondary | ICD-10-CM

## 2024-07-17 DIAGNOSIS — I2510 Atherosclerotic heart disease of native coronary artery without angina pectoris: Secondary | ICD-10-CM

## 2024-07-17 DIAGNOSIS — E782 Mixed hyperlipidemia: Secondary | ICD-10-CM

## 2024-07-17 DIAGNOSIS — N138 Other obstructive and reflux uropathy: Secondary | ICD-10-CM

## 2024-07-17 DIAGNOSIS — Z125 Encounter for screening for malignant neoplasm of prostate: Secondary | ICD-10-CM

## 2024-07-17 NOTE — Telephone Encounter (Signed)
 Copied from CRM #[id number]. Topic: Clinical - Request for Lab/Test Order >> Jul 17, 2024 10:30 AM Franky GRADE wrote: Reason for CRM: Patient is scheduled for his annual physical on 08/16/2024 at 4pm and instead of fasting the whole day he would like to know if he can have the labs done a day prior or morning of; however, no active orders.

## 2024-07-17 NOTE — Telephone Encounter (Signed)
 He can do either  just let me know his preference. He needs to fast for just 6 hours if he wants to wait until the appointment

## 2024-07-17 NOTE — Telephone Encounter (Signed)
 Left voicemail for patient to call the office back.

## 2024-07-20 NOTE — Telephone Encounter (Signed)
 Called and spoke with patient.  Pt prefers to have labs done before the appointment.  Would like to come in at 8am on 08/16/24

## 2024-07-20 NOTE — Telephone Encounter (Signed)
 I will place the orders. Please get him on the lab schedule

## 2024-07-20 NOTE — Addendum Note (Signed)
 Addended by: WENDEE LYNWOOD HERO on: 07/20/2024 04:55 PM   Modules accepted: Orders

## 2024-07-23 NOTE — Telephone Encounter (Signed)
 Patient has been scheduled for labs

## 2024-08-15 ENCOUNTER — Encounter: Admitting: Nurse Practitioner

## 2024-08-16 ENCOUNTER — Other Ambulatory Visit

## 2024-08-16 ENCOUNTER — Encounter: Payer: Self-pay | Admitting: Nurse Practitioner

## 2024-08-16 ENCOUNTER — Ambulatory Visit: Admitting: Nurse Practitioner

## 2024-08-16 VITALS — BP 126/82 | HR 69 | Temp 97.7°F | Ht 74.75 in | Wt 213.0 lb

## 2024-08-16 DIAGNOSIS — N138 Other obstructive and reflux uropathy: Secondary | ICD-10-CM | POA: Diagnosis not present

## 2024-08-16 DIAGNOSIS — N401 Enlarged prostate with lower urinary tract symptoms: Secondary | ICD-10-CM

## 2024-08-16 DIAGNOSIS — I10 Essential (primary) hypertension: Secondary | ICD-10-CM | POA: Diagnosis not present

## 2024-08-16 DIAGNOSIS — E782 Mixed hyperlipidemia: Secondary | ICD-10-CM | POA: Diagnosis not present

## 2024-08-16 DIAGNOSIS — Z126 Encounter for screening for malignant neoplasm of bladder: Secondary | ICD-10-CM

## 2024-08-16 DIAGNOSIS — I2510 Atherosclerotic heart disease of native coronary artery without angina pectoris: Secondary | ICD-10-CM

## 2024-08-16 DIAGNOSIS — Z125 Encounter for screening for malignant neoplasm of prostate: Secondary | ICD-10-CM | POA: Diagnosis not present

## 2024-08-16 DIAGNOSIS — Z7189 Other specified counseling: Secondary | ICD-10-CM

## 2024-08-16 DIAGNOSIS — Z0000 Encounter for general adult medical examination without abnormal findings: Secondary | ICD-10-CM

## 2024-08-16 LAB — LIPID PANEL
Cholesterol: 118 mg/dL (ref 28–200)
HDL: 57.6 mg/dL (ref >39.00)
LDL Cholesterol: 49 mg/dL (ref 10–99)
NonHDL: 60.06
Total CHOL/HDL Ratio: 2
Triglycerides: 57 mg/dL (ref 10.0–149.0)
VLDL: 11.4 mg/dL (ref 0.0–40.0)

## 2024-08-16 LAB — CBC WITH DIFFERENTIAL/PLATELET
Basophils Absolute: 0 K/uL (ref 0.0–0.1)
Basophils Relative: 0.3 % (ref 0.0–3.0)
Eosinophils Absolute: 0.2 K/uL (ref 0.0–0.7)
Eosinophils Relative: 2.3 % (ref 0.0–5.0)
HCT: 41.6 % (ref 39.0–52.0)
Hemoglobin: 14.2 g/dL (ref 13.0–17.0)
Lymphocytes Relative: 11.6 % — ABNORMAL LOW (ref 12.0–46.0)
Lymphs Abs: 0.9 K/uL (ref 0.7–4.0)
MCHC: 34.1 g/dL (ref 30.0–36.0)
MCV: 90.6 fl (ref 78.0–100.0)
Monocytes Absolute: 0.7 K/uL (ref 0.1–1.0)
Monocytes Relative: 9.4 % (ref 3.0–12.0)
Neutro Abs: 6 K/uL (ref 1.4–7.7)
Neutrophils Relative %: 76.4 % (ref 43.0–77.0)
Platelets: 171 K/uL (ref 150.0–400.0)
RBC: 4.59 Mil/uL (ref 4.22–5.81)
RDW: 13.7 % (ref 11.5–15.5)
WBC: 7.8 K/uL (ref 4.0–10.5)

## 2024-08-16 LAB — COMPREHENSIVE METABOLIC PANEL WITH GFR
ALT: 26 U/L (ref 3–53)
AST: 30 U/L (ref 5–37)
Albumin: 4.3 g/dL (ref 3.5–5.2)
Alkaline Phosphatase: 57 U/L (ref 39–117)
BUN: 19 mg/dL (ref 6–23)
CO2: 28 meq/L (ref 19–32)
Calcium: 9.6 mg/dL (ref 8.4–10.5)
Chloride: 104 meq/L (ref 96–112)
Creatinine, Ser: 0.97 mg/dL (ref 0.40–1.50)
GFR: 77.65 mL/min (ref >60.00)
Glucose, Bld: 89 mg/dL (ref 70–99)
Potassium: 4.6 meq/L (ref 3.5–5.1)
Sodium: 140 meq/L (ref 135–145)
Total Bilirubin: 0.9 mg/dL (ref 0.2–1.2)
Total Protein: 6.8 g/dL (ref 6.0–8.3)

## 2024-08-16 LAB — URINALYSIS, MICROSCOPIC ONLY: RBC / HPF: NONE SEEN (ref 0–?)

## 2024-08-16 LAB — MICROALBUMIN / CREATININE URINE RATIO
Creatinine,U: 169.6 mg/dL
Microalb Creat Ratio: 5.8 mg/g (ref 0.0–30.0)
Microalb, Ur: 1 mg/dL (ref 0.7–1.9)

## 2024-08-16 LAB — PSA, MEDICARE: PSA: 0.31 ng/mL (ref 0.10–4.00)

## 2024-08-16 LAB — TSH: TSH: 1.67 u[IU]/mL (ref 0.35–5.50)

## 2024-08-16 NOTE — Assessment & Plan Note (Signed)
 Patient has intermittent nocturia not on any medications currently stable at this juncture

## 2024-08-16 NOTE — Assessment & Plan Note (Signed)
 History of same currently maintained on lifestyle modifications only blood pressure controlled.

## 2024-08-16 NOTE — Assessment & Plan Note (Signed)
 Patient currently followed by cardiology maintained on baby aspirin , atorvastatin  80 mg daily, and ezetimibe 10 mg daily.  LDL less than 50 continue medication as prescribed follow-up with cardiologist as recommended

## 2024-08-16 NOTE — Assessment & Plan Note (Signed)
 Discussed age-appropriate immunizations and screening exams.  Did review patient's personal, surgical, social, family histories.  Patient is up-to-date with all age-appropriate vaccinations.  Patient up-to-date on CRC screening and prostate cancer screening.  Patient was given information at discharge about preventative healthcare maintenance with anticipatory guidance.

## 2024-08-16 NOTE — Progress Notes (Signed)
 Established Patient Office Visit  Subjective   Patient ID: Philip Mack, male    DOB: 1950-09-28  Age: 73 y.o. MRN: [id number]  Chief Complaint  Patient presents with   Annual Exam    HPI  HTN: Patient currently maintained on lifestyle modifications only  CAD: Currently maintained on atorvastatin  80 mg daily and ezetimibe 10 mg daily. Patient is followed by Dr. Florencio cardiologist  for complete physical and follow up of chronic conditions.  Immunizations: -Tetanus: Completed in 2024 -Influenza: Up-to-date -Shingles: Completed Shingrix series -Pneumonia: Completed 2025  Diet: Fair diet. He is eatin 3 meals a day. Furit or ceral in the am. States tlunch is the big meal. States fruit or left overs. States that he will do coffee and water.  Exercise: Goes to the [facility] 5 days  a week at a hour. Does cardio at the gym.   Eye exam: Completes annually. July this year. States that wears glasses  Dental exam: Completes semi-annually every 3 months with peridontisit and dentist   Colonoscopy: Completed in 04/10/2024, repeat 5 years.  Patient due 2030 Lung Cancer Screening: Former smoker    PSA: Up-to-date  Sleep: going ot bed after 9 and getting up around 3-4 Monday thru Thursday. On the weekend he will sleep in for approx 8 hours   Advanced directive: HPOA. States that he is going to update       Review of Systems  Constitutional:  Negative for chills and fever.  Respiratory:  Negative for shortness of breath.   Cardiovascular:  Negative for chest pain and leg swelling.  Gastrointestinal:  Negative for abdominal pain, blood in stool, constipation, diarrhea, nausea and vomiting.       BM daily   Genitourinary:  Negative for dysuria and hematuria.       Nocturia intermittently   Neurological:  Negative for tingling and headaches.  Psychiatric/Behavioral:  Negative for hallucinations and suicidal ideas.       Objective:     BP 126/82   Pulse 69   Temp 97.7 F  (36.5 C) (Oral)   Ht 6' 2.75 (1.899 m)   Wt 213 lb (96.6 kg)   SpO2 95%   BMI 26.80 kg/m  BP Readings from Last 3 Encounters:  08/16/24 126/82  07/05/24 112/72  04/10/24 111/68   Wt Readings from Last 3 Encounters:  08/16/24 213 lb (96.6 kg)  07/05/24 207 lb 12.8 oz (94.3 kg)  04/10/24 205 lb (93 kg)   SpO2 Readings from Last 3 Encounters:  08/16/24 95%  07/05/24 99%  04/10/24 97%      Physical Exam Vitals and nursing note reviewed.  Constitutional:      Appearance: Normal appearance.  HENT:     Right Ear: Tympanic membrane, ear canal and external ear normal.     Left Ear: Tympanic membrane, ear canal and external ear normal.     Mouth/Throat:     Mouth: Mucous membranes are moist.     Pharynx: Oropharynx is clear.  Eyes:     Extraocular Movements: Extraocular movements intact.     Pupils: Pupils are equal, round, and reactive to light.  Cardiovascular:     Rate and Rhythm: Normal rate and regular rhythm.     Pulses: Normal pulses.     Heart sounds: Normal heart sounds.  Pulmonary:     Effort: Pulmonary effort is normal.     Breath sounds: Normal breath sounds.  Abdominal:     General: Bowel sounds are normal. There  is no distension.     Palpations: There is no mass.     Tenderness: There is no abdominal tenderness.     Hernia: No hernia is present.  Musculoskeletal:     Right lower leg: No edema.     Left lower leg: No edema.  Lymphadenopathy:     Cervical: No cervical adenopathy.  Skin:    General: Skin is warm.  Neurological:     General: No focal deficit present.     Mental Status: He is alert.     Deep Tendon Reflexes:     Reflex Scores:      Bicep reflexes are 2+ on the right side and 2+ on the left side.      Patellar reflexes are 2+ on the right side and 2+ on the left side.    Comments: Bilateral upper and lower extremity strength 5/5  Psychiatric:        Mood and Affect: Mood normal.        Behavior: Behavior normal.        Thought  Content: Thought content normal.        Judgment: Judgment normal.      Results for orders placed or performed in visit on 08/16/24  Microalbumin / creatinine urine ratio  Result Value Ref Range   Microalb, Ur 1.0 0.7 - 1.9 mg/dL   Creatinine,U 830.3 mg/dL   Microalb Creat Ratio 5.8 0.0 - 30.0 mg/g  Urine Microscopic  Result Value Ref Range   WBC, UA 0-2/hpf 0-2/hpf   RBC / HPF none seen 0-2/hpf   Squamous Epithelial / HPF Rare(0-4/hpf) Rare(0-4/hpf)  Lipid panel  Result Value Ref Range   Cholesterol 118 28 - 200 mg/dL   Triglycerides 42.9 89.9 - 149.0 mg/dL   HDL 42.39 >60.99 mg/dL   VLDL 88.5 0.0 - 59.9 mg/dL   LDL Cholesterol 49 10 - 99 mg/dL   Total CHOL/HDL Ratio 2    NonHDL 60.06   PSA, Medicare  Result Value Ref Range   PSA 0.31 0.10 - 4.00 ng/ml  TSH  Result Value Ref Range   TSH 1.67 0.35 - 5.50 uIU/mL  Comprehensive metabolic panel with GFR  Result Value Ref Range   Sodium 140 135 - 145 mEq/L   Potassium 4.6 3.5 - 5.1 mEq/L   Chloride 104 96 - 112 mEq/L   CO2 28 19 - 32 mEq/L   Glucose, Bld 89 70 - 99 mg/dL   BUN 19 6 - 23 mg/dL   Creatinine, Ser 9.02 0.40 - 1.50 mg/dL   Total Bilirubin 0.9 0.2 - 1.2 mg/dL   Alkaline Phosphatase 57 39 - 117 U/L   AST 30 5 - 37 U/L   ALT 26 3 - 53 U/L   Total Protein 6.8 6.0 - 8.3 g/dL   Albumin 4.3 3.5 - 5.2 g/dL   GFR 22.34 >39.99 mL/min   Calcium  9.6 8.4 - 10.5 mg/dL  CBC with Differential/Platelet  Result Value Ref Range   WBC 7.8 4.0 - 10.5 K/uL   RBC 4.59 4.22 - 5.81 Mil/uL   Hemoglobin 14.2 13.0 - 17.0 g/dL   HCT 58.3 60.9 - 47.9 %   MCV 90.6 78.0 - 100.0 fl   MCHC 34.1 30.0 - 36.0 g/dL   RDW 86.2 88.4 - 84.4 %   Platelets 171.0 150.0 - 400.0 K/uL   Neutrophils Relative % 76.4 43.0 - 77.0 %   Lymphocytes Relative 11.6 (L) 12.0 - 46.0 %   Monocytes Relative  9.4 3.0 - 12.0 %   Eosinophils Relative 2.3 0.0 - 5.0 %   Basophils Relative 0.3 0.0 - 3.0 %   Neutro Abs 6.0 1.4 - 7.7 K/uL   Lymphs Abs 0.9 0.7 -  4.0 K/uL   Monocytes Absolute 0.7 0.1 - 1.0 K/uL   Eosinophils Absolute 0.2 0.0 - 0.7 K/uL   Basophils Absolute 0.0 0.0 - 0.1 K/uL      The ASCVD Risk score (Arnett DK, et al., 2019) failed to calculate for the following reasons:   Risk score cannot be calculated because patient has a medical history suggesting prior/existing ASCVD   * - Cholesterol units were assumed    Assessment & Plan:   Problem List Items Addressed This Visit       Cardiovascular and Mediastinum   Coronary artery disease involving native coronary artery of native heart   Patient currently followed by cardiology maintained on baby aspirin , atorvastatin  80 mg daily, and ezetimibe 10 mg daily.  LDL less than 50 continue medication as prescribed follow-up with cardiologist as recommended      Benign essential HTN   History of same currently maintained on lifestyle modifications only blood pressure controlled.        Genitourinary   BPH with obstruction/lower urinary tract symptoms   Patient has intermittent nocturia not on any medications currently stable at this juncture        Other   Preventative health care - Primary   Discussed age-appropriate immunizations and screening exams.  Did review patient's personal, surgical, social, family histories.  Patient is up-to-date with all age-appropriate vaccinations.  Patient up-to-date on CRC screening and prostate cancer screening.  Patient was given information at discharge about preventative healthcare maintenance with anticipatory guidance.      Hyperlipidemia, mixed   Currently maintained on atorvastatin  80 mg and ezetimibe 10 mg daily.  Cholesterol well-controlled with LDL below 50      Advance directive discussed with patient   Discussed in office.  Patient does have a healthcare power of attorney and is planning on updating it.  Will give us  a copy if he decides to do such       Return in about 1 year (around 08/16/2025) for CPE and Labs.     Adina Crandall, NP

## 2024-08-16 NOTE — Patient Instructions (Signed)
 Nice to see you today Your labs looked good Follow up with me in 1 year, sooner if you need me

## 2024-08-16 NOTE — Assessment & Plan Note (Signed)
 Discussed in office.  Patient does have a healthcare power of attorney and is planning on updating it.  Will give us  a copy if he decides to do such

## 2024-08-16 NOTE — Assessment & Plan Note (Signed)
 Currently maintained on atorvastatin  80 mg and ezetimibe 10 mg daily.  Cholesterol well-controlled with LDL below 50

## 2024-08-17 ENCOUNTER — Ambulatory Visit: Payer: Self-pay | Admitting: Nurse Practitioner

## 2024-08-17 ENCOUNTER — Encounter: Payer: Medicare HMO | Admitting: Nurse Practitioner

## 2025-08-19 ENCOUNTER — Encounter: Admitting: Nurse Practitioner
